# Patient Record
Sex: Female | Born: 1935 | Race: White | Hispanic: No | Smoking: Never smoker
Health system: Southern US, Community
[De-identification: ages and names within clinical notes are randomized; demographics above are authoritative.]

## PROBLEM LIST (undated history)

## (undated) DIAGNOSIS — E7800 Pure hypercholesterolemia, unspecified: Secondary | ICD-10-CM

## (undated) DIAGNOSIS — K5792 Diverticulitis of intestine, part unspecified, without perforation or abscess without bleeding: Secondary | ICD-10-CM

## (undated) DIAGNOSIS — E785 Hyperlipidemia, unspecified: Secondary | ICD-10-CM

## (undated) DIAGNOSIS — E039 Hypothyroidism, unspecified: Secondary | ICD-10-CM

## (undated) DIAGNOSIS — M1990 Unspecified osteoarthritis, unspecified site: Secondary | ICD-10-CM

## (undated) DIAGNOSIS — I10 Essential (primary) hypertension: Secondary | ICD-10-CM

## (undated) DIAGNOSIS — E079 Disorder of thyroid, unspecified: Secondary | ICD-10-CM

## (undated) HISTORY — DX: Unspecified osteoarthritis, unspecified site: M19.90

## (undated) HISTORY — PX: EYE SURGERY: SHX253

## (undated) HISTORY — PX: ANKLE SURGERY: SHX546

## (undated) HISTORY — PX: ABDOMINAL HYSTERECTOMY: SHX81

## (undated) HISTORY — PX: OTHER SURGICAL HISTORY: SHX169

## (undated) HISTORY — PX: BACK SURGERY: SHX140

---

## 2000-05-14 ENCOUNTER — Other Ambulatory Visit: Admission: RE | Admit: 2000-05-14 | Discharge: 2000-05-14 | Payer: Self-pay | Admitting: Internal Medicine

## 2002-01-13 ENCOUNTER — Other Ambulatory Visit: Admission: RE | Admit: 2002-01-13 | Discharge: 2002-01-13 | Payer: Self-pay | Admitting: *Deleted

## 2002-05-26 ENCOUNTER — Encounter: Payer: Self-pay | Admitting: Emergency Medicine

## 2002-05-26 ENCOUNTER — Inpatient Hospital Stay (HOSPITAL_COMMUNITY): Admission: EM | Admit: 2002-05-26 | Discharge: 2002-05-27 | Payer: Self-pay | Admitting: Emergency Medicine

## 2010-08-06 ENCOUNTER — Encounter: Payer: Self-pay | Admitting: Specialist

## 2010-12-27 ENCOUNTER — Ambulatory Visit
Admission: RE | Admit: 2010-12-27 | Discharge: 2010-12-27 | Disposition: A | Discharge status: Home / Self Care | Payer: BC Managed Care – PPO | Source: Ambulatory Visit | Attending: Family Medicine | Admitting: Family Medicine

## 2010-12-27 ENCOUNTER — Other Ambulatory Visit: Payer: Self-pay | Admitting: Family Medicine

## 2010-12-27 DIAGNOSIS — R42 Dizziness and giddiness: Secondary | ICD-10-CM

## 2010-12-27 DIAGNOSIS — G44009 Cluster headache syndrome, unspecified, not intractable: Secondary | ICD-10-CM

## 2010-12-28 ENCOUNTER — Other Ambulatory Visit (HOSPITAL_BASED_OUTPATIENT_CLINIC_OR_DEPARTMENT_OTHER): Payer: Self-pay | Admitting: Family Medicine

## 2010-12-28 DIAGNOSIS — R51 Headache: Secondary | ICD-10-CM

## 2010-12-29 ENCOUNTER — Ambulatory Visit (HOSPITAL_BASED_OUTPATIENT_CLINIC_OR_DEPARTMENT_OTHER)
Admission: RE | Admit: 2010-12-29 | Discharge: 2010-12-29 | Disposition: A | Discharge status: Home / Self Care | Payer: Medicare Other | Source: Ambulatory Visit | Attending: Family Medicine | Admitting: Family Medicine

## 2010-12-29 DIAGNOSIS — G319 Degenerative disease of nervous system, unspecified: Secondary | ICD-10-CM | POA: Insufficient documentation

## 2010-12-29 DIAGNOSIS — R51 Headache: Secondary | ICD-10-CM | POA: Insufficient documentation

## 2010-12-29 DIAGNOSIS — J3489 Other specified disorders of nose and nasal sinuses: Secondary | ICD-10-CM | POA: Insufficient documentation

## 2010-12-29 DIAGNOSIS — I679 Cerebrovascular disease, unspecified: Secondary | ICD-10-CM | POA: Insufficient documentation

## 2010-12-29 DIAGNOSIS — R42 Dizziness and giddiness: Secondary | ICD-10-CM | POA: Insufficient documentation

## 2011-09-21 ENCOUNTER — Emergency Department (HOSPITAL_BASED_OUTPATIENT_CLINIC_OR_DEPARTMENT_OTHER)
Admission: EM | Admit: 2011-09-21 | Discharge: 2011-09-21 | Disposition: A | Discharge status: Home Health | Payer: Medicare Other | Attending: Emergency Medicine | Admitting: Emergency Medicine

## 2011-09-21 ENCOUNTER — Encounter (HOSPITAL_BASED_OUTPATIENT_CLINIC_OR_DEPARTMENT_OTHER): Payer: Self-pay

## 2011-09-21 DIAGNOSIS — I10 Essential (primary) hypertension: Secondary | ICD-10-CM | POA: Insufficient documentation

## 2011-09-21 DIAGNOSIS — R109 Unspecified abdominal pain: Secondary | ICD-10-CM | POA: Insufficient documentation

## 2011-09-21 DIAGNOSIS — R3915 Urgency of urination: Secondary | ICD-10-CM | POA: Insufficient documentation

## 2011-09-21 DIAGNOSIS — E079 Disorder of thyroid, unspecified: Secondary | ICD-10-CM | POA: Insufficient documentation

## 2011-09-21 DIAGNOSIS — N390 Urinary tract infection, site not specified: Secondary | ICD-10-CM | POA: Insufficient documentation

## 2011-09-21 DIAGNOSIS — R300 Dysuria: Secondary | ICD-10-CM | POA: Insufficient documentation

## 2011-09-21 DIAGNOSIS — E785 Hyperlipidemia, unspecified: Secondary | ICD-10-CM | POA: Insufficient documentation

## 2011-09-21 DIAGNOSIS — Z79899 Other long term (current) drug therapy: Secondary | ICD-10-CM | POA: Insufficient documentation

## 2011-09-21 HISTORY — DX: Essential (primary) hypertension: I10

## 2011-09-21 HISTORY — DX: Hyperlipidemia, unspecified: E78.5

## 2011-09-21 HISTORY — DX: Disorder of thyroid, unspecified: E07.9

## 2011-09-21 LAB — URINALYSIS, ROUTINE W REFLEX MICROSCOPIC
Glucose, UA: NEGATIVE mg/dL
Ketones, ur: 15 mg/dL — AB
Protein, ur: NEGATIVE mg/dL
Urobilinogen, UA: 1 mg/dL (ref 0.0–1.0)

## 2011-09-21 LAB — CBC
HCT: 39.1 % (ref 36.0–46.0)
MCHC: 33.8 g/dL (ref 30.0–36.0)
MCV: 89.7 fL (ref 78.0–100.0)
Platelets: 265 10*3/uL (ref 150–400)
RDW: 13.4 % (ref 11.5–15.5)

## 2011-09-21 LAB — COMPREHENSIVE METABOLIC PANEL
AST: 20 U/L (ref 0–37)
Albumin: 3.7 g/dL (ref 3.5–5.2)
Calcium: 9.3 mg/dL (ref 8.4–10.5)
Creatinine, Ser: 0.7 mg/dL (ref 0.50–1.10)
Sodium: 135 mEq/L (ref 135–145)

## 2011-09-21 LAB — DIFFERENTIAL
Basophils Absolute: 0 10*3/uL (ref 0.0–0.1)
Basophils Relative: 0 % (ref 0–1)
Eosinophils Relative: 0 % (ref 0–5)
Monocytes Absolute: 0.9 10*3/uL (ref 0.1–1.0)
Neutro Abs: 15.4 10*3/uL — ABNORMAL HIGH (ref 1.7–7.7)

## 2011-09-21 LAB — URINE MICROSCOPIC-ADD ON

## 2011-09-21 MED ORDER — CIPROFLOXACIN HCL 500 MG PO TABS: 500.0000 mg | ORAL_TABLET | Freq: Two times a day (BID) | ORAL | Status: AC

## 2011-09-21 MED ORDER — DEXTROSE 5 % IV SOLN
1.0000 g | INTRAVENOUS | Status: DC
  Administered 2011-09-21: 1 g via INTRAVENOUS
  Filled 2011-09-21: qty 10

## 2011-09-21 MED ORDER — SODIUM CHLORIDE 0.9 % IV SOLN
Freq: Once | INTRAVENOUS | Status: AC
  Administered 2011-09-21: 15:00:00 via INTRAVENOUS

## 2011-09-21 MED ORDER — CIPROFLOXACIN HCL 500 MG PO TABS
500.0000 mg | ORAL_TABLET | Freq: Once | ORAL | Status: AC
  Administered 2011-09-21: 500 mg via ORAL
  Filled 2011-09-21: qty 1

## 2011-09-21 NOTE — Discharge Instructions (Signed)

## 2011-09-21 NOTE — ED Provider Notes (Signed)
History     CSN: [id number]  Arrival date & time 09/21/11  1311   First MD Initiated Contact with Patient 09/21/11 1346      Chief Complaint  Patient presents with  . Dysuria  . Abdominal Pain    (Consider location/radiation/quality/duration/timing/severity/associated sxs/prior treatment) Patient is a 78 y.o. female presenting with dysuria. The history is provided by the patient. No language interpreter was used.  Dysuria  This is a new problem. The current episode started 3 to 5 hours ago. The problem occurs every urination. The problem has not changed since onset.The quality of the pain is described as burning. The pain is at a severity of 3/10. The pain is mild. There has been no fever. She is not sexually active. Associated symptoms include urgency. Pertinent negatives include no chills, no nausea, no vomiting, no discharge, no hematuria and no flank pain. Treatments tried: cystex. Her past medical history is significant for recurrent UTIs.  Pt complains of burning with urination.  Pt reports she has had urinary tract infections in the past and that this feels the same way.  Pt denies fever, no vomitting  Past Medical History  Diagnosis Date  . Thyroid disease   . Hypertension   . Hyperlipemia     Past Surgical History  Procedure Date  . Back surgery   . Abdominal hysterectomy   . Ankle surgery   . Eye surgery     No family history on file.  History  Substance Use Topics  . Smoking status: Never Smoker   . Smokeless tobacco: Never Used  . Alcohol Use: No    OB History    Grav Para Term Preterm Abortions TAB SAB Ect Mult Living                  Review of Systems  Constitutional: Negative for chills.  Gastrointestinal: Negative for nausea and vomiting.  Genitourinary: Positive for dysuria and urgency. Negative for hematuria and flank pain.  All other systems reviewed and are negative.    Allergies  Sulfa antibiotics and Macrobid  Home Medications    Current Outpatient Rx  Name Route Sig Dispense Refill  . B COMPLEX VITAMINS PO CAPS Oral Take 1 capsule by mouth daily.    . OMEGA-3 FATTY ACIDS 1000 MG PO CAPS Oral Take by mouth daily.    Marland Kitchen LEVOTHYROXINE SODIUM 100 MCG PO TABS Oral Take 100 mcg by mouth daily.    Marland Kitchen LISINOPRIL 10 MG PO TABS Oral Take 10 mg by mouth daily.    Marland Kitchen METOPROLOL SUCCINATE ER 100 MG PO TB24 Oral Take 100 mg by mouth daily. Take with or immediately following a meal.    . SIMVASTATIN 10 MG PO TABS Oral Take 10 mg by mouth at bedtime.      BP 131/52  Pulse 80  Temp(Src) 99.7 F (37.6 C) (Oral)  Resp 16  Ht 5\' 5"  (1.651 m)  Wt 157 lb (71.215 kg)  BMI 26.13 kg/m2  SpO2 94%  Physical Exam  Nursing note and vitals reviewed. Constitutional: She is oriented to person, place, and time. She appears well-developed and well-nourished.  HENT:  Head: Normocephalic and atraumatic.  Eyes: Conjunctivae are normal. Pupils are equal, round, and reactive to light.  Neck: Normal range of motion.  Cardiovascular: Normal rate and normal heart sounds.   Pulmonary/Chest: Effort normal and breath sounds normal.  Abdominal: Soft. There is no tenderness.  Musculoskeletal: Normal range of motion.  Neurological: She is alert and  oriented to person, place, and time. She has normal reflexes.  Skin: Skin is warm.  Psychiatric: She has a normal mood and affect.    ED Course  Procedures (including critical care time)   Labs Reviewed  URINALYSIS, ROUTINE W REFLEX MICROSCOPIC   No results found.   No diagnosis found.    MDM  Pt given Iv Rocephin.  Labs obtained.  Pt has taken cipro in the past without problems.  Pt given cipro po       Lonia Skinner [place], [place] 09/23/11 1721

## 2011-09-21 NOTE — ED Notes (Signed)
Pt reports suprapubic pain that started this am unrelieved after taking cystex

## 2011-09-25 NOTE — ED Provider Notes (Signed)
Patient seen and examined and agree with assessment and plan.   Hilario Quarry, MD 09/25/11 [phone]

## 2011-10-29 ENCOUNTER — Telehealth (INDEPENDENT_AMBULATORY_CARE_PROVIDER_SITE_OTHER): Payer: Self-pay

## 2011-10-29 ENCOUNTER — Emergency Department (INDEPENDENT_AMBULATORY_CARE_PROVIDER_SITE_OTHER): Payer: Medicare Other

## 2011-10-29 ENCOUNTER — Inpatient Hospital Stay (HOSPITAL_BASED_OUTPATIENT_CLINIC_OR_DEPARTMENT_OTHER)
Admission: EM | Admit: 2011-10-29 | Discharge: 2011-11-05 | DRG: 372 | Disposition: A | Discharge status: Home Health | Payer: Medicare Other | Attending: Surgery | Admitting: Surgery

## 2011-10-29 ENCOUNTER — Encounter (HOSPITAL_BASED_OUTPATIENT_CLINIC_OR_DEPARTMENT_OTHER): Payer: Self-pay

## 2011-10-29 DIAGNOSIS — K5732 Diverticulitis of large intestine without perforation or abscess without bleeding: Secondary | ICD-10-CM | POA: Diagnosis present

## 2011-10-29 DIAGNOSIS — R109 Unspecified abdominal pain: Secondary | ICD-10-CM

## 2011-10-29 DIAGNOSIS — M949 Disorder of cartilage, unspecified: Secondary | ICD-10-CM

## 2011-10-29 DIAGNOSIS — R1032 Left lower quadrant pain: Secondary | ICD-10-CM

## 2011-10-29 DIAGNOSIS — E039 Hypothyroidism, unspecified: Secondary | ICD-10-CM | POA: Diagnosis present

## 2011-10-29 DIAGNOSIS — E785 Hyperlipidemia, unspecified: Secondary | ICD-10-CM | POA: Diagnosis present

## 2011-10-29 DIAGNOSIS — K630 Abscess of intestine: Principal | ICD-10-CM | POA: Diagnosis present

## 2011-10-29 DIAGNOSIS — K5730 Diverticulosis of large intestine without perforation or abscess without bleeding: Secondary | ICD-10-CM

## 2011-10-29 DIAGNOSIS — I10 Essential (primary) hypertension: Secondary | ICD-10-CM | POA: Diagnosis present

## 2011-10-29 HISTORY — DX: Diverticulitis of intestine, part unspecified, without perforation or abscess without bleeding: K57.92

## 2011-10-29 LAB — DIFFERENTIAL
Eosinophils Relative: 1 % (ref 0–5)
Lymphocytes Relative: 9 % — ABNORMAL LOW (ref 12–46)
Monocytes Absolute: 2.1 10*3/uL — ABNORMAL HIGH (ref 0.1–1.0)
Monocytes Relative: 8 % (ref 3–12)
Neutrophils Relative %: 82 % — ABNORMAL HIGH (ref 43–77)

## 2011-10-29 LAB — COMPREHENSIVE METABOLIC PANEL
ALT: 26 U/L (ref 0–35)
AST: 29 U/L (ref 0–37)
CO2: 28 mEq/L (ref 19–32)
Calcium: 9.3 mg/dL (ref 8.4–10.5)
GFR calc non Af Amer: 64 mL/min — ABNORMAL LOW (ref >90)
Sodium: 134 mEq/L — ABNORMAL LOW (ref 135–145)

## 2011-10-29 LAB — CBC
Hemoglobin: 11.6 g/dL — ABNORMAL LOW (ref 12.0–15.0)
MCH: 29.5 pg (ref 26.0–34.0)
MCHC: 33.5 g/dL (ref 30.0–36.0)
MCV: 88 fL (ref 78.0–100.0)
Platelets: 640 10*3/uL — ABNORMAL HIGH (ref 150–400)
Platelets: 641 10*3/uL — ABNORMAL HIGH (ref 150–400)
RBC: 4.17 MIL/uL (ref 3.87–5.11)
WBC: 26.4 10*3/uL — ABNORMAL HIGH (ref 4.0–10.5)

## 2011-10-29 LAB — URINALYSIS, ROUTINE W REFLEX MICROSCOPIC
Bilirubin Urine: NEGATIVE
Hgb urine dipstick: NEGATIVE
Specific Gravity, Urine: 1.01 (ref 1.005–1.030)
Urobilinogen, UA: 0.2 mg/dL (ref 0.0–1.0)
pH: 6 (ref 5.0–8.0)

## 2011-10-29 LAB — CREATININE, SERUM: Creatinine, Ser: 0.67 mg/dL (ref 0.50–1.10)

## 2011-10-29 MED ORDER — PANTOPRAZOLE SODIUM 40 MG IV SOLR
40.0000 mg | Freq: Every day | INTRAVENOUS | Status: DC
  Administered 2011-10-29 – 2011-11-03 (×6): 40 mg via INTRAVENOUS
  Filled 2011-10-29 (×7): qty 40

## 2011-10-29 MED ORDER — ACETAMINOPHEN 325 MG PO TABS
650.0000 mg | ORAL_TABLET | Freq: Four times a day (QID) | ORAL | Status: DC | PRN
  Administered 2011-11-01: 325 mg via ORAL
  Administered 2011-11-02 – 2011-11-05 (×10): 650 mg via ORAL
  Filled 2011-10-29 (×10): qty 2
  Filled 2011-10-29: qty 1

## 2011-10-29 MED ORDER — METRONIDAZOLE IN NACL 5-0.79 MG/ML-% IV SOLN
500.0000 mg | Freq: Once | INTRAVENOUS | Status: AC
  Administered 2011-10-29: 500 mg via INTRAVENOUS
  Filled 2011-10-29: qty 100

## 2011-10-29 MED ORDER — KCL IN DEXTROSE-NACL 20-5-0.45 MEQ/L-%-% IV SOLN
INTRAVENOUS | Status: DC
  Administered 2011-10-29 – 2011-11-03 (×6): via INTRAVENOUS
  Filled 2011-10-29 (×13): qty 1000

## 2011-10-29 MED ORDER — HYDROCODONE-ACETAMINOPHEN 5-325 MG PO TABS
1.0000 | ORAL_TABLET | ORAL | Status: DC | PRN
  Administered 2011-11-01: 2 via ORAL
  Administered 2011-11-01: 1 via ORAL
  Filled 2011-10-29: qty 2
  Filled 2011-10-29 (×2): qty 1

## 2011-10-29 MED ORDER — PIPERACILLIN-TAZOBACTAM 3.375 G IVPB
3.3750 g | Freq: Once | INTRAVENOUS | Status: AC
  Administered 2011-10-29: 3.375 g via INTRAVENOUS
  Filled 2011-10-29: qty 50

## 2011-10-29 MED ORDER — IOHEXOL 300 MG/ML  SOLN
100.0000 mL | Freq: Once | INTRAMUSCULAR | Status: AC | PRN
  Administered 2011-10-29: 100 mL via INTRAVENOUS

## 2011-10-29 MED ORDER — ACETAMINOPHEN 650 MG RE SUPP: 650.0000 mg | Freq: Four times a day (QID) | RECTAL | Status: DC | PRN

## 2011-10-29 MED ORDER — DIPHENHYDRAMINE HCL 50 MG/ML IJ SOLN: 12.5000 mg | Freq: Four times a day (QID) | INTRAMUSCULAR | Status: DC | PRN

## 2011-10-29 MED ORDER — ONDANSETRON HCL 4 MG/2ML IJ SOLN: 4.0000 mg | Freq: Four times a day (QID) | INTRAMUSCULAR | Status: DC | PRN

## 2011-10-29 MED ORDER — PIPERACILLIN-TAZOBACTAM 3.375 G IVPB
3.3750 g | Freq: Three times a day (TID) | INTRAVENOUS | Status: DC
Start: 2011-10-29 — End: 2011-10-29
  Filled 2011-10-29 (×2): qty 50

## 2011-10-29 MED ORDER — METOPROLOL TARTRATE 25 MG PO TABS
25.0000 mg | ORAL_TABLET | Freq: Every day | ORAL | Status: DC
  Administered 2011-10-29 – 2011-11-05 (×8): 25 mg via ORAL
  Filled 2011-10-29 (×8): qty 1

## 2011-10-29 MED ORDER — LEVOTHYROXINE SODIUM 88 MCG PO TABS
88.0000 ug | ORAL_TABLET | Freq: Every day | ORAL | Status: DC
  Administered 2011-10-30 – 2011-11-05 (×7): 88 ug via ORAL
  Filled 2011-10-29 (×8): qty 1

## 2011-10-29 MED ORDER — HEPARIN SODIUM (PORCINE) 5000 UNIT/ML IJ SOLN
5000.0000 [IU] | Freq: Three times a day (TID) | INTRAMUSCULAR | Status: DC
  Administered 2011-10-30 – 2011-11-05 (×18): 5000 [IU] via SUBCUTANEOUS
  Filled 2011-10-29 (×20): qty 1

## 2011-10-29 MED ORDER — DIPHENHYDRAMINE HCL 12.5 MG/5ML PO ELIX: 12.5000 mg | ORAL_SOLUTION | Freq: Four times a day (QID) | ORAL | Status: DC | PRN

## 2011-10-29 MED ORDER — SODIUM CHLORIDE 0.9 % IV BOLUS (SEPSIS)
500.0000 mL | Freq: Once | INTRAVENOUS | Status: AC
  Administered 2011-10-29: 11:00:00 via INTRAVENOUS

## 2011-10-29 MED ORDER — OMEGA-3 FATTY ACIDS 1000 MG PO CAPS: 1.0000 g | ORAL_CAPSULE | Freq: Two times a day (BID) | ORAL | Status: DC

## 2011-10-29 MED ORDER — HEPARIN SODIUM (PORCINE) 5000 UNIT/ML IJ SOLN
5000.0000 [IU] | Freq: Three times a day (TID) | INTRAMUSCULAR | Status: AC
  Administered 2011-10-29 – 2011-10-30 (×2): 5000 [IU] via SUBCUTANEOUS
  Filled 2011-10-29 (×3): qty 1

## 2011-10-29 MED ORDER — IOHEXOL 300 MG/ML  SOLN
36.0000 mL | Freq: Once | INTRAMUSCULAR | Status: AC | PRN
  Administered 2011-10-29: 36 mL via INTRAVENOUS

## 2011-10-29 MED ORDER — PIPERACILLIN-TAZOBACTAM 3.375 G IVPB
3.3750 g | Freq: Three times a day (TID) | INTRAVENOUS | Status: DC
  Administered 2011-10-29 – 2011-11-04 (×17): 3.375 g via INTRAVENOUS
  Filled 2011-10-29 (×20): qty 50

## 2011-10-29 MED ORDER — MORPHINE SULFATE 2 MG/ML IJ SOLN
1.0000 mg | INTRAMUSCULAR | Status: DC | PRN
  Administered 2011-10-30 – 2011-10-31 (×4): 2 mg via INTRAVENOUS
  Filled 2011-10-29 (×4): qty 1

## 2011-10-29 MED ORDER — OMEGA-3-ACID ETHYL ESTERS 1 G PO CAPS
1.0000 g | ORAL_CAPSULE | Freq: Two times a day (BID) | ORAL | Status: DC
  Administered 2011-10-29 – 2011-11-05 (×14): 1 g via ORAL
  Filled 2011-10-29 (×15): qty 1

## 2011-10-29 NOTE — H&P (Signed)
Mikayla Villanueva is an 78 y.o. female.   Mikayla Villanueva, Mikayla Villanueva, [place] Chief Complaint: Abdominal pain HPI: The patient is a 78 year old female who first developed pain and was seen on 09/21/2010. She was thought to have a urinary tract infection and placed on Cipro by mouth. She finish the course and continued to have abdominal pain and presented to her primary care doctor Mikayla Villanueva, restart her second course of Cipro. She got somewhat better, but never had resolution of her lower abdominal and left lower quadrant discomfort. She completed the last course of Cipro on 10/26/2011. She continues to have pain especially with ambulation. She describes as deep and her abdomen. She presented to the emergency room and [facility] today. White count is 26,400. CT scan shows a 5.5 x 9.5 cm multiloculated fluid collection in the left lower quadrant, thickening of the sigmoid colon more multiple sigmoid colon diverticula. No hydronephrosis or hydroureter some osteopenia with degenerative changes of the thoracic spine. This is consistent with sigmoid diverticulitis and abscess. Dr.Gerkin was contact and patient was transferred to [facility] for admission and further medical treatment.  Past Medical History  Diagnosis Date  . Thyroid disease   . Hypertension   . Hyperlipemia   . Diverticulitis 20 plus years ago. 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 Hx.of Hand tremors,   Bilateral Neuropathy both lower legs/feet    Prior Work up shows she did not have Parkinson Dz.    Past Surgical History  Procedure Date  . Back surgery   . Abdominal hysterectomy   . Ankle surgery   . Eye surgery     No family history on file. Social History:  reports that she has never smoked. She has never used smokeless tobacco. She reports that she does not drink alcohol or use illicit drugs.  Allergies:  Allergies  Allergen Reactions  . Sulfa  Antibiotics Hives  . Macrobid Hives    Medications Prior to Admission  Medication Dose Route Frequency Provider Last Rate Last Dose  . iohexol (OMNIPAQUE) 300 MG/ML solution 100 mL  100 mL Intravenous Once PRN Loren Racer, MD   100 mL at 10/29/11 1158  . iohexol (OMNIPAQUE) 300 MG/ML solution 36 mL  36 mL Intravenous Once PRN Loren Racer, MD   36 mL at 10/29/11 1058  . metroNIDAZOLE (FLAGYL) IVPB 500 mg  500 mg Intravenous Once Loren Racer, MD   500 mg at 10/29/11 1336  . piperacillin-tazobactam (ZOSYN) IVPB 3.375 g  3.375 g Intravenous Once Loren Racer, MD   3.375 g at 10/29/11 1243  . sodium chloride 0.9 % bolus 500 mL  500 mL Intravenous Once Loren Racer, MD       Medications Prior to Admission  Medication Sig Dispense Refill  . fish oil-omega-3 fatty acids 1000 MG capsule Take 1 g by mouth 2 (two) times daily.       Marland Kitchen levothyroxine (SYNTHROID, LEVOTHROID) 88 MCG tablet Take 88 mcg by mouth daily.      Marland Kitchen lisinopril (PRINIVIL,ZESTRIL) 20 MG tablet Take 20 mg by mouth daily.      . meloxicam (MOBIC) 7.5 MG tablet Take 7.5 mg by mouth daily.      . simvastatin (ZOCOR) 40 MG tablet Take 40 mg by mouth every evening.      Marland Kitchen DISCONTD: metoprolol tartrate (LOPRESSOR) 25 MG tablet Take 25 mg by mouth 2 (two) times daily.  Results for orders placed during the hospital encounter of 10/29/11 (from the past 48 hour(s))  CBC     Status: Abnormal   Collection Time   10/29/11 10:50 AM      Component Value Range Comment   WBC 26.4 (*) 4.0 - 10.5 (K/uL)    RBC 4.17  3.87 - 5.11 (MIL/uL)    Hemoglobin 12.5  12.0 - 15.0 (g/dL)    HCT 16.1  09.6 - 04.5 (%)    MCV 87.5  78.0 - 100.0 (fL)    MCH 30.0  26.0 - 34.0 (pg)    MCHC 34.2  30.0 - 36.0 (g/dL)    RDW 40.9  81.1 - 91.4 (%)    Platelets 640 (*) 150 - 400 (K/uL)   DIFFERENTIAL     Status: Abnormal   Collection Time   10/29/11 10:50 AM      Component Value Range Comment   Neutrophils Relative 82 (*) 43 - 77 (%)     Lymphocytes Relative 9 (*) 12 - 46 (%)    Monocytes Relative 8  3 - 12 (%)    Eosinophils Relative 1  0 - 5 (%)    Basophils Relative 0  0 - 1 (%)    Neutro Abs 21.6 (*) 1.7 - 7.7 (K/uL)    Lymphs Abs 2.4  0.7 - 4.0 (K/uL)    Monocytes Absolute 2.1 (*) 0.1 - 1.0 (K/uL)    Eosinophils Absolute 0.3  0.0 - 0.7 (K/uL)    Basophils Absolute 0.0  0.0 - 0.1 (K/uL)    WBC Morphology WHITE COUNT CONFIRMED ON SMEAR     COMPREHENSIVE METABOLIC PANEL     Status: Abnormal   Collection Time   10/29/11 10:50 AM      Component Value Range Comment   Sodium 134 (*) 135 - 145 (mEq/L)    Potassium 4.0  3.5 - 5.1 (mEq/L)    Chloride 95 (*) 96 - 112 (mEq/L)    CO2 28  19 - 32 (mEq/L)    Glucose, Bld 137 (*) 70 - 99 (mg/dL)    BUN 20  6 - 23 (mg/dL)    Creatinine, Ser 7.82  0.50 - 1.10 (mg/dL)    Calcium 9.3  8.4 - 10.5 (mg/dL)    Total Protein 8.1  6.0 - 8.3 (g/dL)    Albumin 3.1 (*) 3.5 - 5.2 (g/dL)    AST 29  0 - 37 (U/L)    ALT 26  0 - 35 (U/L)    Alkaline Phosphatase 161 (*) 39 - 117 (U/L)    Total Bilirubin 0.5  0.3 - 1.2 (mg/dL)    GFR calc non Af Amer 64 (*) >90 (mL/min)    GFR calc Af Amer 74 (*) >90 (mL/min)   URINALYSIS, ROUTINE W REFLEX MICROSCOPIC     Status: Normal   Collection Time   10/29/11 10:59 AM      Component Value Range Comment   Color, Urine YELLOW  YELLOW     APPearance CLEAR  CLEAR     Specific Gravity, Urine 1.010  1.005 - 1.030     pH 6.0  5.0 - 8.0     Glucose, UA NEGATIVE  NEGATIVE (mg/dL)    Hgb urine dipstick NEGATIVE  NEGATIVE     Bilirubin Urine NEGATIVE  NEGATIVE     Ketones, ur NEGATIVE  NEGATIVE (mg/dL)    Protein, ur NEGATIVE  NEGATIVE (mg/dL)    Urobilinogen, UA 0.2  0.0 - 1.0 (  mg/dL)    Nitrite NEGATIVE  NEGATIVE     Leukocytes, UA NEGATIVE  NEGATIVE  MICROSCOPIC NOT DONE ON URINES WITH NEGATIVE PROTEIN, BLOOD, LEUKOCYTES, NITRITE, OR GLUCOSE <1000 mg/dL.   Ct Abdomen Pelvis W Contrast  10/29/2011  *RADIOLOGY REPORT*  Clinical Data:  left lower quadrant  pain  CT ABDOMEN AND PELVIS WITH CONTRAST  Technique:  Multidetector CT imaging of the abdomen and pelvis was performed following the standard protocol during bolus administration of intravenous contrast.  Contrast: OMNIPAQUE IOHEXOL 300 MG/ML  SOLN  Comparison: 02/20/2006  Findings: Lung bases are unremarkable.  Sagittal images of the spine shows diffuse osteopenia.  Multilevel degenerative changes lumbar spine are noted.  Enhanced liver is unremarkable.  No intrahepatic biliary ductal dilatation.  Atherosclerotic calcifications of the abdominal aorta and the iliac arteries.  No evidence of aortic aneurysm.  The pancreas, spleen and adrenal glands are unremarkable.  Enhanced kidneys are symmetrical in size.  No hydronephrosis or hydroureter.  Delayed renal images shows bilateral renal symmetrical excretion.  A few diverticula are noted descending colon.  Multiple sigmoid colon diverticula are noted.  There is a oval-shaped multiloculated fluid collection in close proximity with the sigmoid colon in the left pelvis extending below the sigmoid colon.  This collection measures about 9.5 cm length by 5.5 cm thickness.  This probable represents a pelvic abscess or diverticular abscess.  Less likely a multiloculated cystic ovarian neoplasm.  Clinical correlation is necessary.  Some stool noted in the sigmoid colon.  The patient is status post hysterectomy.  IMPRESSION:  1.  Multiple sigmoid colon diverticula are noted.  There is some thickening of the sigmoid colon wall.  There is a collection with enhancing wall adjacent to sigmoid colon extending inferiorly to sigmoid colon measures at least 5.5 x 9.5 cm.  This is highly suspicious for pelvic diverticular abscess.  Clinical correlation is necessary.  Less likely multicystic ovarian neoplasm. Follow-up examination after treatment is recommended to assure resolution.  2.  No hydronephrosis or hydroureter. 3.  Osteopenia and degenerative changes thoracic spine. 4.   No pericecal inflammation.  No small bowel obstruction.  Original Report Authenticated By: Natasha Mead, M.D.    Review of Systems  Constitutional: Negative for fever, chills, weight loss and diaphoresis.  HENT: Negative.   Eyes: Negative.   Respiratory: Negative.   Cardiovascular: Negative.   Gastrointestinal: Positive for abdominal pain (lower abdomen and LLQ). Negative for heartburn, nausea, vomiting, diarrhea, constipation, blood in stool and melena.  Genitourinary: Positive for frequency (Today after IV and  oral contrast). Negative for dysuria, urgency, hematuria and flank pain.       She was told she had UTI with 2 rounds of Cipro.  Musculoskeletal: Positive for back pain. Negative for myalgias.       Some back pain, chronic on and off.   Some neuropathy both lower legs. Hand tremors for some years. W/U ruled out Parkinson.   Neurological: Negative for weakness.  Endo/Heme/Allergies: Negative.     Blood pressure 145/83, pulse 80, temperature 97.5 F (36.4 C), temperature source Oral, resp. rate 18, height 5\' 4"  (1.626 m), weight 71.215 kg (157 lb), SpO2 96.00%. Physical Exam  Constitutional: She is oriented to person, place, and time. She appears well-developed and well-nourished. No distress.  HENT:  Head: Normocephalic and atraumatic.  Nose: Nose normal.  Mouth/Throat: No oropharyngeal exudate.  Eyes: Conjunctivae and EOM are normal. Pupils are equal, round, and reactive to light. Right eye exhibits no discharge. Left  eye exhibits no discharge. No scleral icterus.  Neck: Normal range of motion. Neck supple. No JVD present. No tracheal deviation present. No thyromegaly present.  Cardiovascular: Normal rate, regular rhythm and intact distal pulses.  Exam reveals no gallop.   Murmur heard. Respiratory: Effort normal and breath sounds normal. No respiratory distress. She has no wheezes. She has no rales. She exhibits no tenderness.  GI: Soft. Bowel sounds are normal. She exhibits  no distension and no mass. There is tenderness (LLQ, more discomfort feeling femoral pulse than LLQ on palpation). There is rebound. There is no guarding.  Musculoskeletal: She exhibits tenderness. She exhibits no edema.  Lymphadenopathy:    She has no cervical adenopathy.  Neurological: She is alert and oriented to person, place, and time. She has normal reflexes. She displays normal reflexes. No cranial nerve deficit. Coordination normal.  Skin: Skin is warm and dry. No rash noted. She is not diaphoretic. No erythema.  Psychiatric: She has a normal mood and affect. Her behavior is normal. Judgment and thought content normal.     Assessment/Plan 1. Diverticulitis sigmoid colon with multiloculated fluid collection/abscess.  Last colonoscopy 10 years or more. 2. History of hypertension 3. History of hypothyroid on supplement 4. Dyslipidemia- treated 5. history of hand tremors, and lower extremity neuropathy.  Plan: Verlon Au going to continue IV antibiotics with Zosyn, bowel rest, INR will evaluate and perform percutaneous drainage tomorrow. Continue her home meds with sips of water and ice chips only. With further treatment as indicated. Will [facility] physician assistant for Dr. Darnell Level.   Tejah Brekke 10/29/2011, 3:32 PM

## 2011-10-29 NOTE — ED Notes (Signed)
Pt reports abdominal pain and a 5 week hx of UTI.  Seen here 5 weeks ago, received IV ABS, PO Cipro and re-evaluated by PMD.  Still not feeling well.

## 2011-10-29 NOTE — H&P (Signed)
General Surgery - [facility] - Attending  Patient seen and examined.  Discussed findings and plan with patient and daughter at bedside.  Will proceed with percutaneous drainage procedure in AM 4/16 by interventional radiology.  Continue IV Zosyn.  Patient admitted to surgical service.  Cassara Nida M. Matthe Sloane, MD, FACS [facility] Office: [phone]    

## 2011-10-29 NOTE — Telephone Encounter (Signed)
Dr. Gerrit Friends paged re: Mikayla Villanueva [phone]) for patient ETA, pt to arrive 20 - 30 minutes going to [facility] long room 1539.

## 2011-10-29 NOTE — Progress Notes (Signed)
ANTIBIOTIC CONSULT NOTE - INITIAL  Pharmacy Consult for Zosyn Indication: Sigmoid diverticulitis and abscess   Allergies  Allergen Reactions  . Sulfa Antibiotics Hives  . Macrobid Hives   Patient Measurements: Height: 5\' 4"  (162.6 cm) Weight: 157 lb (71.215 kg) IBW/kg (Calculated) : 54.7   Vital Signs: Temp: 97.5 F (36.4 C) (04/15 1507) Temp src: Oral (04/15 1507) BP: 145/83 mmHg (04/15 1507) Pulse Rate: 80  (04/15 1507) Intake/Output from previous day:   Intake/Output from this shift: Total I/O In: 50 [I.V.:50] Out: 200 [Urine:200]  Labs:  Basename 10/29/11 1050  WBC 26.4*  HGB 12.5  PLT 640*  LABCREA --  CREATININE 0.80   Estimated Creatinine Clearance: 47 ml/min (by C-G formula based on Cr of 0.8). No results found for this basename: VANCOTROUGH:2,VANCOPEAK:2,VANCORANDOM:2,GENTTROUGH:2,GENTPEAK:2,GENTRANDOM:2,TOBRATROUGH:2,TOBRAPEAK:2,TOBRARND:2,AMIKACINPEAK:2,AMIKACINTROU:2,AMIKACIN:2, in the last 72 hours   Microbiology: No results found for this or any previous visit (from the past 720 hour(s)).  Medical History: Past Medical History  Diagnosis Date  . Thyroid disease   . Hypertension   . Hyperlipemia   . Diverticulitis    Medications:  Prescriptions prior to admission  Medication Sig Dispense Refill  . acetaminophen (TYLENOL) 500 MG tablet Take 1,000 mg by mouth 2 (two) times daily as needed. For pain      . fish oil-omega-3 fatty acids 1000 MG capsule Take 1 g by mouth 2 (two) times daily.       Marland Kitchen levothyroxine (SYNTHROID, LEVOTHROID) 88 MCG tablet Take 88 mcg by mouth daily.      Marland Kitchen lisinopril (PRINIVIL,ZESTRIL) 20 MG tablet Take 20 mg by mouth daily.      . meloxicam (MOBIC) 7.5 MG tablet Take 7.5 mg by mouth daily.      . metoprolol tartrate (LOPRESSOR) 25 MG tablet Take 25 mg by mouth daily.      . simvastatin (ZOCOR) 40 MG tablet Take 40 mg by mouth every evening.       Anti-infectives     Start     Dose/Rate Route Frequency Ordered Stop   10/29/11 2000  piperacillin-tazobactam (ZOSYN) IVPB 3.375 g       3.375 g 12.5 mL/hr over 240 Minutes Intravenous 3 times per day 10/29/11 1635     10/29/11 1800   piperacillin-tazobactam (ZOSYN) IVPB 3.375 g  Status:  Discontinued        3.375 g 12.5 mL/hr over 240 Minutes Intravenous 3 times per day 10/29/11 1620 10/29/11 1635   10/29/11 1230  piperacillin-tazobactam (ZOSYN) IVPB 3.375 g       3.375 g 12.5 mL/hr over 240 Minutes Intravenous  Once 10/29/11 1224 10/29/11 1643   10/29/11 1230   metroNIDAZOLE (FLAGYL) IVPB 500 mg        500 mg 100 mL/hr over 60 Minutes Intravenous  Once 10/29/11 1224 10/29/11 1436         Assessment:  78yo F admitted with abdominal pain. Recently treated with Cipro for UTI.  CT revealed diverticulitis and abscess.  Starting empiric Zosyn.  SCr normal, CrCl 47, 55N.  Goal of Therapy:  Appropriate regimen for renal fxn  Plan:   Zosyn 3.375g IV Q8H infused over 4hrs.  F/u daily.  Reece Packer 10/29/2011,4:27 PM

## 2011-10-29 NOTE — Progress Notes (Signed)
Patient ID: Mikayla Villanueva, female   DOB: 07-08-1921, 78 y.o.   MRN: [id number] Request received for CT guided drainage of left pelvic/diverticular abscess. History noted, labs checked, imaging studies reviewed by Dr. Fredia Sorrow. Details/risks of procedure d/w pt/ family with their understanding and consent.   Chest- CTA bilat., heart -RRR, abd.- soft, positive BS, tender suprapubic/LLQ regions.                                                                                                                          Past Medical History  Diagnosis Date  . Thyroid disease   . Hypertension   . Hyperlipemia   . Diverticulitis    Past Surgical History  Procedure Date  . Back surgery   . Abdominal hysterectomy   . Ankle surgery   . Eye surgery    Ct Abdomen Pelvis W Contrast  10/29/2011  *RADIOLOGY REPORT*  Clinical Data:  left lower quadrant pain  CT ABDOMEN AND PELVIS WITH CONTRAST  Technique:  Multidetector CT imaging of the abdomen and pelvis was performed following the standard protocol during bolus administration of intravenous contrast.  Contrast: OMNIPAQUE IOHEXOL 300 MG/ML  SOLN  Comparison: 02/20/2006  Findings: Lung bases are unremarkable.  Sagittal images of the spine shows diffuse osteopenia.  Multilevel degenerative changes lumbar spine are noted.  Enhanced liver is unremarkable.  No intrahepatic biliary ductal dilatation.  Atherosclerotic calcifications of the abdominal aorta and the iliac arteries.  No evidence of aortic aneurysm.  The pancreas, spleen and adrenal glands are unremarkable.  Enhanced kidneys are symmetrical in size.  No hydronephrosis or hydroureter.  Delayed renal images shows bilateral renal symmetrical excretion.  A few diverticula are noted descending colon.  Multiple sigmoid colon diverticula are noted.  There is a oval-shaped multiloculated fluid collection in close proximity with the sigmoid colon in the left pelvis extending below the sigmoid colon.  This collection  measures about 9.5 cm length by 5.5 cm thickness.  This probable represents a pelvic abscess or diverticular abscess.  Less likely a multiloculated cystic ovarian neoplasm.  Clinical correlation is necessary.  Some stool noted in the sigmoid colon.  The patient is status post hysterectomy.  IMPRESSION:  1.  Multiple sigmoid colon diverticula are noted.  There is some thickening of the sigmoid colon wall.  There is a collection with enhancing wall adjacent to sigmoid colon extending inferiorly to sigmoid colon measures at least 5.5 x 9.5 cm.  This is highly suspicious for pelvic diverticular abscess.  Clinical correlation is necessary.  Less likely multicystic ovarian neoplasm. Follow-up examination after treatment is recommended to assure resolution.  2.  No hydronephrosis or hydroureter. 3.  Osteopenia and degenerative changes thoracic spine. 4.  No pericecal inflammation.  No small bowel obstruction.  Original Report Authenticated By: Natasha Mead, M.D.  Results for orders placed during the hospital encounter of 10/29/11  CBC      Component Value Range   WBC 26.4 (*) 4.0 -  10.5 (K/uL)   RBC 4.17  3.87 - 5.11 (MIL/uL)   Hemoglobin 12.5  12.0 - 15.0 (g/dL)   HCT 16.1  09.6 - 04.5 (%)   MCV 87.5  78.0 - 100.0 (fL)   MCH 30.0  26.0 - 34.0 (pg)   MCHC 34.2  30.0 - 36.0 (g/dL)   RDW 40.9  81.1 - 91.4 (%)   Platelets 640 (*) 150 - 400 (K/uL)  DIFFERENTIAL      Component Value Range   Neutrophils Relative 82 (*) 43 - 77 (%)   Lymphocytes Relative 9 (*) 12 - 46 (%)   Monocytes Relative 8  3 - 12 (%)   Eosinophils Relative 1  0 - 5 (%)   Basophils Relative 0  0 - 1 (%)   Neutro Abs 21.6 (*) 1.7 - 7.7 (K/uL)   Lymphs Abs 2.4  0.7 - 4.0 (K/uL)   Monocytes Absolute 2.1 (*) 0.1 - 1.0 (K/uL)   Eosinophils Absolute 0.3  0.0 - 0.7 (K/uL)   Basophils Absolute 0.0  0.0 - 0.1 (K/uL)   WBC Morphology WHITE COUNT CONFIRMED ON SMEAR    COMPREHENSIVE METABOLIC PANEL      Component Value Range   Sodium 134 (*) 135  - 145 (mEq/L)   Potassium 4.0  3.5 - 5.1 (mEq/L)   Chloride 95 (*) 96 - 112 (mEq/L)   CO2 28  19 - 32 (mEq/L)   Glucose, Bld 137 (*) 70 - 99 (mg/dL)   BUN 20  6 - 23 (mg/dL)   Creatinine, Ser 7.82  0.50 - 1.10 (mg/dL)   Calcium 9.3  8.4 - 95.6 (mg/dL)   Total Protein 8.1  6.0 - 8.3 (g/dL)   Albumin 3.1 (*) 3.5 - 5.2 (g/dL)   AST 29  0 - 37 (U/L)   ALT 26  0 - 35 (U/L)   Alkaline Phosphatase 161 (*) 39 - 117 (U/L)   Total Bilirubin 0.5  0.3 - 1.2 (mg/dL)   GFR calc non Af Amer 64 (*) >90 (mL/min)   GFR calc Af Amer 74 (*) >90 (mL/min)  URINALYSIS, ROUTINE W REFLEX MICROSCOPIC      Component Value Range   Color, Urine YELLOW  YELLOW    APPearance CLEAR  CLEAR    Specific Gravity, Urine 1.010  1.005 - 1.030    pH 6.0  5.0 - 8.0    Glucose, UA NEGATIVE  NEGATIVE (mg/dL)   Hgb urine dipstick NEGATIVE  NEGATIVE    Bilirubin Urine NEGATIVE  NEGATIVE    Ketones, ur NEGATIVE  NEGATIVE (mg/dL)   Protein, ur NEGATIVE  NEGATIVE (mg/dL)   Urobilinogen, UA 0.2  0.0 - 1.0 (mg/dL)   Nitrite NEGATIVE  NEGATIVE    Leukocytes, UA NEGATIVE  NEGATIVE

## 2011-10-29 NOTE — Progress Notes (Deleted)
General Surgery [facility] - Attending  Patient seen and examined.  Discussed findings and plan with patient and daughter at bedside.  Will proceed with percutaneous drainage procedure in AM 4/16 by interventional radiology.  Continue IV Zosyn.  Patient admitted to surgical service.  Velora Heckler, MD, [facility] Office: [phone]

## 2011-10-29 NOTE — ED Notes (Signed)
CT Tech at bedside for PO contrast administration

## 2011-10-29 NOTE — ED Notes (Signed)
Patient transported to CT via stretcher.

## 2011-10-29 NOTE — ED Provider Notes (Signed)
History     CSN: [id number]  Arrival date & time 10/29/11  1001   First MD Initiated Contact with Patient 10/29/11 1013      Chief Complaint  Patient presents with  . Abdominal Pain    (Consider location/radiation/quality/duration/timing/severity/associated sxs/prior treatment) HPI Pt seen 3/8 and diagnosed with UTI. Given rocephin IV and d/c with course of cipro. Seen by PCP after abx completed still c/o same symptoms and given another course of PO cipro. Pt cont to have lower abd pain, worse in LLQ. She has dysuria and frequency. Denies fever, chills, N/V/D.  Past Medical History  Diagnosis Date  . Thyroid disease   . Hypertension   . Hyperlipemia   . Diverticulitis     Past Surgical History  Procedure Date  . Back surgery   . Abdominal hysterectomy   . Ankle surgery   . Eye surgery     No family history on file.  History  Substance Use Topics  . Smoking status: Never Smoker   . Smokeless tobacco: Never Used  . Alcohol Use: No    OB History    Grav Para Term Preterm Abortions TAB SAB Ect Mult Living                  Review of Systems  Constitutional: Negative for fever and chills.  Gastrointestinal: Positive for abdominal pain. Negative for nausea, vomiting and diarrhea.  Genitourinary: Positive for dysuria and frequency. Negative for flank pain.  Musculoskeletal: Negative for back pain.  Neurological: Negative for weakness and numbness.    Allergies  Sulfa antibiotics and Macrobid  Home Medications   Current Outpatient Rx  Name Route Sig Dispense Refill  . B COMPLEX VITAMINS PO CAPS Oral Take 1 capsule by mouth daily.    . OMEGA-3 FATTY ACIDS 1000 MG PO CAPS Oral Take by mouth daily.    Marland Kitchen LEVOTHYROXINE SODIUM 88 MCG PO TABS Oral Take 88 mcg by mouth daily.    Marland Kitchen LISINOPRIL 20 MG PO TABS Oral Take 20 mg by mouth daily.    . MELOXICAM 7.5 MG PO TABS Oral Take 7.5 mg by mouth daily.    Marland Kitchen METOPROLOL TARTRATE 25 MG PO TABS Oral Take 25 mg by mouth 2  (two) times daily.    Marland Kitchen SIMVASTATIN 40 MG PO TABS Oral Take 40 mg by mouth every evening.      BP 140/73  Pulse 80  Temp(Src) 98.6 F (37 C) (Oral)  Resp 16  Ht 5\' 4"  (1.626 m)  Wt 157 lb (71.215 kg)  BMI 26.95 kg/m2  SpO2 98%  Physical Exam  Nursing note and vitals reviewed. Constitutional: She is oriented to person, place, and time. She appears well-developed and well-nourished. No distress.  HENT:  Head: Normocephalic and atraumatic.  Mouth/Throat: Oropharynx is clear and moist.  Eyes: EOM are normal. Pupils are equal, round, and reactive to light.  Neck: Normal range of motion. Neck supple.  Cardiovascular: Normal rate and regular rhythm.   Pulmonary/Chest: Effort normal and breath sounds normal. No respiratory distress. She has no wheezes. She has no rales.  Abdominal: Soft. Bowel sounds are normal. She exhibits no mass. There is tenderness (suprapubic and LLQ ttp. No rebound or guarding). There is no rebound and no guarding.  Musculoskeletal: Normal range of motion. She exhibits no edema and no tenderness.  Neurological: She is alert and oriented to person, place, and time.       5/5 motor, sensation intact. Ambulatory  Skin: Skin  is warm and dry. No rash noted. No erythema.  Psychiatric: She has a normal mood and affect. Her behavior is normal.    ED Course  Procedures (including critical care time)  Labs Reviewed  CBC - Abnormal; Notable for the following:    WBC 26.4 (*)    Platelets 640 (*)    All other components within normal limits  DIFFERENTIAL - Abnormal; Notable for the following:    Neutrophils Relative 82 (*)    Lymphocytes Relative 9 (*)    Neutro Abs 21.6 (*)    Monocytes Absolute 2.1 (*)    All other components within normal limits  COMPREHENSIVE METABOLIC PANEL - Abnormal; Notable for the following:    Sodium 134 (*)    Chloride 95 (*)    Glucose, Bld 137 (*)    Albumin 3.1 (*)    Alkaline Phosphatase 161 (*)    GFR calc non Af Amer 64 (*)     GFR calc Af Amer 74 (*)    All other components within normal limits  URINALYSIS, ROUTINE W REFLEX MICROSCOPIC  URINE CULTURE   Ct Abdomen Pelvis W Contrast  10/29/2011  *RADIOLOGY REPORT*  Clinical Data:  left lower quadrant pain  CT ABDOMEN AND PELVIS WITH CONTRAST  Technique:  Multidetector CT imaging of the abdomen and pelvis was performed following the standard protocol during bolus administration of intravenous contrast.  Contrast: OMNIPAQUE IOHEXOL 300 MG/ML  SOLN  Comparison: 02/20/2006  Findings: Lung bases are unremarkable.  Sagittal images of the spine shows diffuse osteopenia.  Multilevel degenerative changes lumbar spine are noted.  Enhanced liver is unremarkable.  No intrahepatic biliary ductal dilatation.  Atherosclerotic calcifications of the abdominal aorta and the iliac arteries.  No evidence of aortic aneurysm.  The pancreas, spleen and adrenal glands are unremarkable.  Enhanced kidneys are symmetrical in size.  No hydronephrosis or hydroureter.  Delayed renal images shows bilateral renal symmetrical excretion.  A few diverticula are noted descending colon.  Multiple sigmoid colon diverticula are noted.  There is a oval-shaped multiloculated fluid collection in close proximity with the sigmoid colon in the left pelvis extending below the sigmoid colon.  This collection measures about 9.5 cm length by 5.5 cm thickness.  This probable represents a pelvic abscess or diverticular abscess.  Less likely a multiloculated cystic ovarian neoplasm.  Clinical correlation is necessary.  Some stool noted in the sigmoid colon.  The patient is status post hysterectomy.  IMPRESSION:  1.  Multiple sigmoid colon diverticula are noted.  There is some thickening of the sigmoid colon wall.  There is a collection with enhancing wall adjacent to sigmoid colon extending inferiorly to sigmoid colon measures at least 5.5 x 9.5 cm.  This is highly suspicious for pelvic diverticular abscess.  Clinical correlation  is necessary.  Less likely multicystic ovarian neoplasm. Follow-up examination after treatment is recommended to assure resolution.  2.  No hydronephrosis or hydroureter. 3.  Osteopenia and degenerative changes thoracic spine. 4.  No pericecal inflammation.  No small bowel obstruction.  Original Report Authenticated By: Natasha Mead, M.D.     1. Intestinal diverticular abscess       MDM  Discussed with Dr Sid Falcon. Will accept in transfer to [facility].         Loren Racer, MD 10/29/11 1245

## 2011-10-29 NOTE — ED Notes (Signed)
[facility] in ED for transport.  Family and pt informed of plan of care.

## 2011-10-29 NOTE — ED Notes (Signed)
EMS students at bedside.

## 2011-10-29 NOTE — ED Notes (Signed)
Pt assisted to BSC to void.

## 2011-10-30 ENCOUNTER — Encounter (HOSPITAL_COMMUNITY): Payer: Self-pay | Admitting: Radiology

## 2011-10-30 ENCOUNTER — Inpatient Hospital Stay (HOSPITAL_COMMUNITY): Payer: Medicare Other

## 2011-10-30 LAB — CBC
MCH: 29.8 pg (ref 26.0–34.0)
MCHC: 32.9 g/dL (ref 30.0–36.0)
Platelets: 622 10*3/uL — ABNORMAL HIGH (ref 150–400)
RDW: 14.1 % (ref 11.5–15.5)

## 2011-10-30 LAB — COMPREHENSIVE METABOLIC PANEL
ALT: 18 U/L (ref 0–35)
Albumin: 2.4 g/dL — ABNORMAL LOW (ref 3.5–5.2)
Alkaline Phosphatase: 131 U/L — ABNORMAL HIGH (ref 39–117)
Calcium: 8.3 mg/dL — ABNORMAL LOW (ref 8.4–10.5)
GFR calc Af Amer: 87 mL/min — ABNORMAL LOW (ref >90)
Glucose, Bld: 146 mg/dL — ABNORMAL HIGH (ref 70–99)
Potassium: 3.6 mEq/L (ref 3.5–5.1)
Sodium: 132 mEq/L — ABNORMAL LOW (ref 135–145)
Total Protein: 6.5 g/dL (ref 6.0–8.3)

## 2011-10-30 LAB — URINE CULTURE
Colony Count: 30000
Culture  Setup Time: 201304151656

## 2011-10-30 LAB — PROTIME-INR
INR: 1.2 (ref 0.00–1.49)
Prothrombin Time: 15.5 seconds — ABNORMAL HIGH (ref 11.6–15.2)

## 2011-10-30 MED ORDER — MIDAZOLAM HCL 5 MG/5ML IJ SOLN
INTRAMUSCULAR | Status: AC | PRN
  Administered 2011-10-30: 1 mg via INTRAVENOUS

## 2011-10-30 MED ORDER — FENTANYL CITRATE 0.05 MG/ML IJ SOLN
INTRAMUSCULAR | Status: AC | PRN
  Administered 2011-10-30 (×2): 50 ug via INTRAVENOUS

## 2011-10-30 NOTE — Progress Notes (Signed)
Subjective: Sore left side,  Otherwise about the same.2  Objective: Vital signs in last 24 hours: Temp:  [97.4 F (36.3 C)-99.4 F (37.4 C)] 97.5 F (36.4 C) (04/16 1103) Pulse Rate:  [80-102] 87  (04/16 1103) Resp:  [16-30] 19  (04/16 1103) BP: (102-155)/(60-83) 125/67 mmHg (04/16 1103) SpO2:  [94 %-100 %] 95 % (04/16 1103) Weight:  [71.215 kg (157 lb)] 71.215 kg (157 lb) (04/15 2125) Last BM Date: 10/29/11 Tm 99, VSS,WBC still up, drain placed this AM 120 ml of purulent fluid drained. Intake/Output from previous day: 04/15 0701 - 04/16 0700 In: 50 [I.V.:50] Out: 700 [Urine:700] Intake/Output this shift: Total I/O In: -  Out: 20 [Drains:20]  General appearance: alert, cooperative and no distress GI: tender, +Bs, no distension, drain in LLQ, with some bloody purulent drainage in it  Lab Results:   [facility] 10/30/11 0413 10/29/11 1815  WBC 20.3* 22.1*  HGB 11.2* 11.6*  HCT 34.0* 34.6*  PLT 622* 641*    BMET  Basename 10/30/11 0413 10/29/11 1815 10/29/11 1050  NA 132* -- 134*  K 3.6 -- 4.0  CL 96 -- 95*  CO2 25 -- 28  GLUCOSE 146* -- 137*  BUN 13 -- 20  CREATININE 0.70 0.67 --  CALCIUM 8.3* -- 9.3   PT/INR  Basename 10/30/11 0413  LABPROT 15.5*  INR 1.20     Lab 10/30/11 0413 10/29/11 1050  AST 20 29  ALT 18 26  ALKPHOS 131* 161*  BILITOT 0.5 0.5  PROT 6.5 8.1  ALBUMIN 2.4* 3.1*     Lipase  No results found for this basename: lipase     Studies/Results: Ct Guided Abscess Drain  10/30/2011  *RADIOLOGY REPORT*  Clinical Data: Pelvic diverticular abscess  CT GUIDED LEFT PELVIC DIVERTICULAR ABSCESS 12-FRENCH DRAIN PLACEMENT  Date:  10/30/2011 00:30:00  Radiologist:  Judie Petit. Ruel Favors, M.D.  Medications:  1 mg Versed, 100 mcg Fentanyl  Guidance:  CT  Fluoroscopy time:  None.  Sedation time:  20 minutes  Contrast volume:  None.  Complications:  No immediate  PROCEDURE/FINDINGS:  Informed consent was obtained from the patient following explanation of  the procedure, risks, benefits and alternatives. The patient understands, agrees and consents for the procedure. All questions were addressed.  A time out was performed.  Maximal barrier sterile technique utilized including caps, mask, sterile gowns, sterile gloves, large sterile drape, hand hygiene, and betadine  Previous imaging reviewed.  The patient was positioned supine. Noncontrast localization CT performed through the pelvis.  Complex left pelvic abscess localized.  Under sterile conditions and local anesthesia, an 18 gauge access needle was advanced from an anterior approach into the collection.  Needle position confirmed with CT. Syringe aspiration yielded purulent fluid.  Sample sent for Gram stain and culture.  Guide wire advanced followed by tract dilatation to insert a 12-French drain.  Drain catheter position confirmed with CT.  Syringe aspiration yielded 120 ml purulent fluid.  Catheter secured with a Prolene suture and connected to external suction bulb.  Sterile dressing applied.  No immediate complication.  The patient tolerated the procedure well.  IMPRESSION: Successful CT guided left lower quadrant pelvic abscess drain placement.  Original Report Authenticated By: Judie Petit. Ruel Favors, M.D.   Ct Abdomen Pelvis W Contrast  10/29/2011  *RADIOLOGY REPORT*  Clinical Data:  left lower quadrant pain  CT ABDOMEN AND PELVIS WITH CONTRAST  Technique:  Multidetector CT imaging of the abdomen and pelvis was performed following the standard protocol during bolus  administration of intravenous contrast.  Contrast: OMNIPAQUE IOHEXOL 300 MG/ML  SOLN  Comparison: 02/20/2006  Findings: Lung bases are unremarkable.  Sagittal images of the spine shows diffuse osteopenia.  Multilevel degenerative changes lumbar spine are noted.  Enhanced liver is unremarkable.  No intrahepatic biliary ductal dilatation.  Atherosclerotic calcifications of the abdominal aorta and the iliac arteries.  No evidence of aortic  aneurysm.  The pancreas, spleen and adrenal glands are unremarkable.  Enhanced kidneys are symmetrical in size.  No hydronephrosis or hydroureter.  Delayed renal images shows bilateral renal symmetrical excretion.  A few diverticula are noted descending colon.  Multiple sigmoid colon diverticula are noted.  There is a oval-shaped multiloculated fluid collection in close proximity with the sigmoid colon in the left pelvis extending below the sigmoid colon.  This collection measures about 9.5 cm length by 5.5 cm thickness.  This probable represents a pelvic abscess or diverticular abscess.  Less likely a multiloculated cystic ovarian neoplasm.  Clinical correlation is necessary.  Some stool noted in the sigmoid colon.  The patient is status post hysterectomy.  IMPRESSION:  1.  Multiple sigmoid colon diverticula are noted.  There is some thickening of the sigmoid colon wall.  There is a collection with enhancing wall adjacent to sigmoid colon extending inferiorly to sigmoid colon measures at least 5.5 x 9.5 cm.  This is highly suspicious for pelvic diverticular abscess.  Clinical correlation is necessary.  Less likely multicystic ovarian neoplasm. Follow-up examination after treatment is recommended to assure resolution.  2.  No hydronephrosis or hydroureter. 3.  Osteopenia and degenerative changes thoracic spine. 4.  No pericecal inflammation.  No small bowel obstruction.  Original Report Authenticated By: Natasha Mead, M.D.    Medications:    . heparin  5,000 Units Subcutaneous Q8H  . heparin subcutaneous  5,000 Units Subcutaneous Q8H  . levothyroxine  88 mcg Oral Daily  . metoprolol tartrate  25 mg Oral Daily  . metronidazole  500 mg Intravenous Once  . omega-3 acid ethyl esters  1 g Oral BID  . pantoprazole (PROTONIX) IV  40 mg Intravenous QHS  . piperacillin-tazobactam (ZOSYN)  IV  3.375 g Intravenous Once  . piperacillin-tazobactam (ZOSYN)  IV  3.375 g Intravenous Q8H  . DISCONTD: fish oil-omega-3  fatty acids  1 g Oral BID  . DISCONTD: piperacillin-tazobactam (ZOSYN)  IV  3.375 g Intravenous Q8H    Assessment/Plan Diverticulitis sigmoid colon with multiloculated fluid collection/abscess. Last colonoscopy 10 years or more.  2. History of hypertension  3. History of hypothyroid on supplement  4. Dyslipidemia- treated  5. history of hand tremors, and lower extremity neuropathy.  6.  IR drainage of abscess 10/30/11 Dr. Fredia Sorrow    Plan:  Ice chips, otherwise npo, antibiotics and drainage     LOS: 1 day    Jeramiah Mccaughey 10/30/2011

## 2011-10-30 NOTE — Procedures (Signed)
Successful CT guided LLQ diverticular abscess 12 fr dr No comp Stable 120cc pus aspirated Gs/cx sent

## 2011-10-30 NOTE — Progress Notes (Signed)
General Surgery [facility] - Attending  Patient seen and examined.  Drain in LLQ with thick reddish brown output.  Mild tenderness.  BS present.  Will start CL diet tonight.  Velora Heckler, MD, [facility] Office: [phone]

## 2011-10-31 NOTE — Progress Notes (Signed)
Spoke with dtr and patient at bedside. Patient states much improved. Discussed d/c needs with patient and recommendation for [facility] RN for drain/wound care. Patient lives alone but is just next door to dtr, independent of ADL's prior to admission. Patient did own yard work prior to admission. Dtr is very supportive and able to assist as needed. Received orders for [facility] RN for drain care. Provided patient and dtr with list of [facility] agencies for choice. Will f/u tomorrow, provided contact information for questions.

## 2011-10-31 NOTE — Progress Notes (Signed)
General Surgery [facility] - Attending  Agree.  Will check CBC in AM 4/18.  Monitor drain output.  Follow up CT scan 2-3 days per IR.  Velora Heckler, MD, [facility] Office: [phone]

## 2011-10-31 NOTE — Progress Notes (Signed)
Subjective: Patient doing a little better today ; has some LLQ soreness at drain site; no nausea/vomiting; tolerating liquids; has been OOB; voiding ok, + BM  Objective: Vital signs in last 24 hours: Temp:  [97.5 F (36.4 C)-99.8 F (37.7 C)] 97.6 F (36.4 C) (04/17 0614) Pulse Rate:  [87-90] 87  (04/17 0614) Resp:  [17-19] 17  (04/17 0614) BP: (102-145)/(56-87) 138/57 mmHg (04/17 0614) SpO2:  [94 %-99 %] 98 % (04/17 0614) Last BM Date: 10/30/11  Intake/Output from previous day: 04/16 0701 - 04/17 0700 In: 3280 [I.V.:3275] Out: 580 [Urine:500; Drains:80] Intake/Output this shift:  LLQ drain intact, insertion site ok with small amount old blood around drain entry region, mild-mod tender to palpation, output 80 cc's purulent bloody fluid today, cx's pend; drain flushed with 5 cc's sterile NS without difficulty  Lab Results:   [facility] 10/30/11 0413 10/29/11 1815  WBC 20.3* 22.1*  HGB 11.2* 11.6*  HCT 34.0* 34.6*  PLT 622* 641*   BMET  Basename 10/30/11 0413 10/29/11 1815 10/29/11 1050  NA 132* -- 134*  K 3.6 -- 4.0  CL 96 -- 95*  CO2 25 -- 28  GLUCOSE 146* -- 137*  BUN 13 -- 20  CREATININE 0.70 0.67 --  CALCIUM 8.3* -- 9.3   PT/INR  Basename 10/30/11 0413  LABPROT 15.5*  INR 1.20   ABG No results found for this basename: PHART:2,PCO2:2,PO2:2,HCO3:2 in the last 72 hours Results for orders placed during the hospital encounter of 10/29/11  URINE CULTURE     Status: Normal   Collection Time   10/29/11 10:59 AM      Component Value Range Status Comment   Specimen Description URINE, CLEAN CATCH   Final    Special Requests NONE   Final    Culture  Setup Time [id number]   Final    Colony Count 30,000 COLONIES/ML   Final    Culture     Final    Value: Multiple bacterial morphotypes present, none predominant. Suggest appropriate recollection if clinically indicated.   Report Status 10/30/2011 FINAL   Final   CULTURE, ROUTINE-ABSCESS     Status: Normal  (Preliminary result)   Collection Time   10/30/11  9:39 AM      Component Value Range Status Comment   Specimen Description PERITONEAL CAVITY ABSCESS   Final    Special Requests NONE   Final    Gram Stain     Final    Value: NO WBC SEEN     NO SQUAMOUS EPITHELIAL CELLS SEEN     RARE GRAM NEGATIVE RODS   Culture NO GROWTH 1 DAY   Final    Report Status PENDING   Incomplete     Studies/Results: Ct Guided Abscess Drain  10/30/2011  *RADIOLOGY REPORT*  Clinical Data: Pelvic diverticular abscess  CT GUIDED LEFT PELVIC DIVERTICULAR ABSCESS 12-FRENCH DRAIN PLACEMENT  Date:  10/30/2011 00:30:00  Radiologist:  Judie Petit. Ruel Favors, M.D.  Medications:  1 mg Versed, 100 mcg Fentanyl  Guidance:  CT  Fluoroscopy time:  None.  Sedation time:  20 minutes  Contrast volume:  None.  Complications:  No immediate  PROCEDURE/FINDINGS:  Informed consent was obtained from the patient following explanation of the procedure, risks, benefits and alternatives. The patient understands, agrees and consents for the procedure. All questions were addressed.  A time out was performed.  Maximal barrier sterile technique utilized including caps, mask, sterile gowns, sterile gloves, large sterile drape, hand hygiene, and betadine  Previous imaging  reviewed.  The patient was positioned supine. Noncontrast localization CT performed through the pelvis.  Complex left pelvic abscess localized.  Under sterile conditions and local anesthesia, an 18 gauge access needle was advanced from an anterior approach into the collection.  Needle position confirmed with CT. Syringe aspiration yielded purulent fluid.  Sample sent for Gram stain and culture.  Guide wire advanced followed by tract dilatation to insert a 12-French drain.  Drain catheter position confirmed with CT.  Syringe aspiration yielded 120 ml purulent fluid.  Catheter secured with a Prolene suture and connected to external suction bulb.  Sterile dressing applied.  No immediate complication.   The patient tolerated the procedure well.  IMPRESSION: Successful CT guided left lower quadrant pelvic abscess drain placement.  Original Report Authenticated By: Judie Petit. Ruel Favors, M.D.   Ct Abdomen Pelvis W Contrast  10/29/2011  *RADIOLOGY REPORT*  Clinical Data:  left lower quadrant pain  CT ABDOMEN AND PELVIS WITH CONTRAST  Technique:  Multidetector CT imaging of the abdomen and pelvis was performed following the standard protocol during bolus administration of intravenous contrast.  Contrast: OMNIPAQUE IOHEXOL 300 MG/ML  SOLN  Comparison: 02/20/2006  Findings: Lung bases are unremarkable.  Sagittal images of the spine shows diffuse osteopenia.  Multilevel degenerative changes lumbar spine are noted.  Enhanced liver is unremarkable.  No intrahepatic biliary ductal dilatation.  Atherosclerotic calcifications of the abdominal aorta and the iliac arteries.  No evidence of aortic aneurysm.  The pancreas, spleen and adrenal glands are unremarkable.  Enhanced kidneys are symmetrical in size.  No hydronephrosis or hydroureter.  Delayed renal images shows bilateral renal symmetrical excretion.  A few diverticula are noted descending colon.  Multiple sigmoid colon diverticula are noted.  There is a oval-shaped multiloculated fluid collection in close proximity with the sigmoid colon in the left pelvis extending below the sigmoid colon.  This collection measures about 9.5 cm length by 5.5 cm thickness.  This probable represents a pelvic abscess or diverticular abscess.  Less likely a multiloculated cystic ovarian neoplasm.  Clinical correlation is necessary.  Some stool noted in the sigmoid colon.  The patient is status post hysterectomy.  IMPRESSION:  1.  Multiple sigmoid colon diverticula are noted.  There is some thickening of the sigmoid colon wall.  There is a collection with enhancing wall adjacent to sigmoid colon extending inferiorly to sigmoid colon measures at least 5.5 x 9.5 cm.  This is highly  suspicious for pelvic diverticular abscess.  Clinical correlation is necessary.  Less likely multicystic ovarian neoplasm. Follow-up examination after treatment is recommended to assure resolution.  2.  No hydronephrosis or hydroureter. 3.  Osteopenia and degenerative changes thoracic spine. 4.  No pericecal inflammation.  No small bowel obstruction.  Original Report Authenticated By: Natasha Mead, M.D.    Anti-infectives: Anti-infectives     Start     Dose/Rate Route Frequency Ordered Stop   10/29/11 2000   piperacillin-tazobactam (ZOSYN) IVPB 3.375 g        3.375 g 12.5 mL/hr over 240 Minutes Intravenous 3 times per day 10/29/11 1635     10/29/11 1800   piperacillin-tazobactam (ZOSYN) IVPB 3.375 g  Status:  Discontinued        3.375 g 12.5 mL/hr over 240 Minutes Intravenous 3 times per day 10/29/11 1620 10/29/11 1635   10/29/11 1230   piperacillin-tazobactam (ZOSYN) IVPB 3.375 g        3.375 g 12.5 mL/hr over 240 Minutes Intravenous  Once 10/29/11 1224 10/29/11  1643   10/29/11 1230   metroNIDAZOLE (FLAGYL) IVPB 500 mg        500 mg 100 mL/hr over 60 Minutes Intravenous  Once 10/29/11 1224 10/29/11 1436          Assessment/Plan: s/p LLQ/diverticular abscess drainage 4/16; check final cx's; cont drain NS flushes; check f/u CT once output diminishes; diet per CCS; ambulate.   LOS: 2 days    Lucee Brissett,D [facility] 10/31/2011

## 2011-10-31 NOTE — Progress Notes (Signed)
Subjective: Hurts only when she moves, has been up to bathroom but not much further.  Objective: Vital signs in last 24 hours: Temp:  [97.6 F (36.4 C)-99.8 F (37.7 C)] 97.6 F (36.4 C) (04/17 2130) Pulse Rate:  [87-90] 87  (04/17 0614) Resp:  [17-19] 17  (04/17 0614) BP: (116-145)/(56-87) 138/57 mmHg (04/17 0614) SpO2:  [97 %-99 %] 98 % (04/17 0614) Last BM Date: 10/30/11 80 ml thru drain yesterday, +BM, afebrile, VSS,no labs today Intake/Output from previous day: 04/16 0701 - 04/17 0700 In: 3280 [I.V.:3275] Out: 580 [Urine:500; Drains:80] Intake/Output this shift:    General appearance: alert, cooperative and no distress Resp: clear to auscultation bilaterally GI: soft, less tender, +BS, and BM.  Drainage is bloody/cloudy.  Lab Results:   [facility] 10/30/11 0413 10/29/11 1815  WBC 20.3* 22.1*  HGB 11.2* 11.6*  HCT 34.0* 34.6*  PLT 622* 641*    BMET  Basename 10/30/11 0413 10/29/11 1815 10/29/11 1050  NA 132* -- 134*  K 3.6 -- 4.0  CL 96 -- 95*  CO2 25 -- 28  GLUCOSE 146* -- 137*  BUN 13 -- 20  CREATININE 0.70 0.67 --  CALCIUM 8.3* -- 9.3   PT/INR  Basename 10/30/11 0413  LABPROT 15.5*  INR 1.20     Lab 10/30/11 0413 10/29/11 1050  AST 20 29  ALT 18 26  ALKPHOS 131* 161*  BILITOT 0.5 0.5  PROT 6.5 8.1  ALBUMIN 2.4* 3.1*     Lipase  No results found for this basename: lipase     Studies/Results: Ct Guided Abscess Drain  10/30/2011  *RADIOLOGY REPORT*  Clinical Data: Pelvic diverticular abscess  CT GUIDED LEFT PELVIC DIVERTICULAR ABSCESS 12-FRENCH DRAIN PLACEMENT  Date:  10/30/2011 00:30:00  Radiologist:  Judie Petit. Ruel Favors, M.D.  Medications:  1 mg Versed, 100 mcg Fentanyl  Guidance:  CT  Fluoroscopy time:  None.  Sedation time:  20 minutes  Contrast volume:  None.  Complications:  No immediate  PROCEDURE/FINDINGS:  Informed consent was obtained from the patient following explanation of the procedure, risks, benefits and alternatives. The patient  understands, agrees and consents for the procedure. All questions were addressed.  A time out was performed.  Maximal barrier sterile technique utilized including caps, mask, sterile gowns, sterile gloves, large sterile drape, hand hygiene, and betadine  Previous imaging reviewed.  The patient was positioned supine. Noncontrast localization CT performed through the pelvis.  Complex left pelvic abscess localized.  Under sterile conditions and local anesthesia, an 18 gauge access needle was advanced from an anterior approach into the collection.  Needle position confirmed with CT. Syringe aspiration yielded purulent fluid.  Sample sent for Gram stain and culture.  Guide wire advanced followed by tract dilatation to insert a 12-French drain.  Drain catheter position confirmed with CT.  Syringe aspiration yielded 120 ml purulent fluid.  Catheter secured with a Prolene suture and connected to external suction bulb.  Sterile dressing applied.  No immediate complication.  The patient tolerated the procedure well.  IMPRESSION: Successful CT guided left lower quadrant pelvic abscess drain placement.  Original Report Authenticated By: Judie Petit. Ruel Favors, M.D.   Ct Abdomen Pelvis W Contrast  10/29/2011  *RADIOLOGY REPORT*  Clinical Data:  left lower quadrant pain  CT ABDOMEN AND PELVIS WITH CONTRAST  Technique:  Multidetector CT imaging of the abdomen and pelvis was performed following the standard protocol during bolus administration of intravenous contrast.  Contrast: OMNIPAQUE IOHEXOL 300 MG/ML  SOLN  Comparison:  02/20/2006  Findings: Lung bases are unremarkable.  Sagittal images of the spine shows diffuse osteopenia.  Multilevel degenerative changes lumbar spine are noted.  Enhanced liver is unremarkable.  No intrahepatic biliary ductal dilatation.  Atherosclerotic calcifications of the abdominal aorta and the iliac arteries.  No evidence of aortic aneurysm.  The pancreas, spleen and adrenal glands are unremarkable.   Enhanced kidneys are symmetrical in size.  No hydronephrosis or hydroureter.  Delayed renal images shows bilateral renal symmetrical excretion.  A few diverticula are noted descending colon.  Multiple sigmoid colon diverticula are noted.  There is a oval-shaped multiloculated fluid collection in close proximity with the sigmoid colon in the left pelvis extending below the sigmoid colon.  This collection measures about 9.5 cm length by 5.5 cm thickness.  This probable represents a pelvic abscess or diverticular abscess.  Less likely a multiloculated cystic ovarian neoplasm.  Clinical correlation is necessary.  Some stool noted in the sigmoid colon.  The patient is status post hysterectomy.  IMPRESSION:  1.  Multiple sigmoid colon diverticula are noted.  There is some thickening of the sigmoid colon wall.  There is a collection with enhancing wall adjacent to sigmoid colon extending inferiorly to sigmoid colon measures at least 5.5 x 9.5 cm.  This is highly suspicious for pelvic diverticular abscess.  Clinical correlation is necessary.  Less likely multicystic ovarian neoplasm. Follow-up examination after treatment is recommended to assure resolution.  2.  No hydronephrosis or hydroureter. 3.  Osteopenia and degenerative changes thoracic spine. 4.  No pericecal inflammation.  No small bowel obstruction.  Original Report Authenticated By: Natasha Mead, M.D.    Medications:    . heparin subcutaneous  5,000 Units Subcutaneous Q8H  . levothyroxine  88 mcg Oral Daily  . metoprolol tartrate  25 mg Oral Daily  . omega-3 acid ethyl esters  1 g Oral BID  . pantoprazole (PROTONIX) IV  40 mg Intravenous QHS  . piperacillin-tazobactam (ZOSYN)  IV  3.375 g Intravenous Q8H    Assessment/Plan Diverticulitis sigmoid colon with multiloculated fluid collection/abscess. Last colonoscopy 10 years or more.  2. History of hypertension  3. History of hypothyroid on supplement  4. Dyslipidemia- treated  5. history of hand  tremors, and lower extremity neuropathy.  6. IR drainage of abscess 10/30/11 Dr. Fredia Sorrow   Plan:  Continue antibiotics, bowel rest and drain. Recheck labs am      LOS: 2 days    Mikayla Villanueva 10/31/2011

## 2011-10-31 NOTE — Progress Notes (Signed)
UR complete 

## 2011-11-01 LAB — BASIC METABOLIC PANEL
CO2: 26 mEq/L (ref 19–32)
Chloride: 98 mEq/L (ref 96–112)
GFR calc Af Amer: >90 mL/min (ref >90)
Potassium: 3.8 mEq/L (ref 3.5–5.1)

## 2011-11-01 LAB — CBC
HCT: 34.4 % — ABNORMAL LOW (ref 36.0–46.0)
Platelets: 649 10*3/uL — ABNORMAL HIGH (ref 150–400)
RBC: 3.84 MIL/uL — ABNORMAL LOW (ref 3.87–5.11)
RDW: 14.1 % (ref 11.5–15.5)
WBC: 13.4 10*3/uL — ABNORMAL HIGH (ref 4.0–10.5)

## 2011-11-01 NOTE — Progress Notes (Signed)
  Subjective: Feels much better even when she gets up it doesn't hurt like before.  Objective: Vital signs in last 24 hours: Temp:  [97.4 F (36.3 C)-98.9 F (37.2 C)] 97.4 F (36.3 C) (04/18 0900) Pulse Rate:  [71-91] 88  (04/18 0900) Resp:  [16-18] 18  (04/18 0900) BP: (124-149)/(51-69) 126/62 mmHg (04/18 0900) SpO2:  [95 %-99 %] 95 % (04/18 0900) Last BM Date: 11/01/11 50 ml thru drain, 960 PO, +BM, afebrile, VSS, labs show WBC improving. Intake/Output from previous day: 04/17 0701 - 04/18 0700 In: 3225 [P.O.:960; I.V.:2200; IV Piggyback:50] Out: 2801 [Urine:2750; Drains:50; Stool:1] Intake/Output this shift: Total I/O In: 240 [P.O.:240] Out: 250 [Urine:250]  General appearance: alert, cooperative and no distress Resp: clear to auscultation bilaterally GI: soft, non-tender; bowel sounds normal; no masses,  no organomegaly and Drain still putting out red cloudy fluid  Lab Results:   [facility] 11/01/11 0421 10/30/11 0413  WBC 13.4* 20.3*  HGB 11.1* 11.2*  HCT 34.4* 34.0*  PLT 649* 622*    BMET  Basename 11/01/11 0421 10/30/11 0413  NA 133* 132*  K 3.8 3.6  CL 98 96  CO2 26 25  GLUCOSE 132* 146*  BUN 7 13  CREATININE 0.62 0.70  CALCIUM 8.4 8.3*   PT/INR  Basename 10/30/11 0413  LABPROT 15.5*  INR 1.20     Lab 10/30/11 0413 10/29/11 1050  AST 20 29  ALT 18 26  ALKPHOS 131* 161*  BILITOT 0.5 0.5  PROT 6.5 8.1  ALBUMIN 2.4* 3.1*     Lipase  No results found for this basename: lipase     Studies/Results: No results found.  Medications:    . heparin subcutaneous  5,000 Units Subcutaneous Q8H  . levothyroxine  88 mcg Oral Daily  . metoprolol tartrate  25 mg Oral Daily  . omega-3 acid ethyl esters  1 g Oral BID  . pantoprazole (PROTONIX) IV  40 mg Intravenous QHS  . piperacillin-tazobactam (ZOSYN)  IV  3.375 g Intravenous Q8H    Assessment/Plan Diverticulitis sigmoid colon with multiloculated fluid collection/abscess. Last colonoscopy 10  years or more.  2. History of hypertension  3. History of hypothyroid on supplement  4. Dyslipidemia- treated  5. history of hand tremors, and lower extremity neuropathy.  6. IR drainage of abscess 10/30/11 Dr. Fredia Sorrow   Plan:  She would like to go to Full liquids.  Repeat CT when drainage is down to 10-15 cc per day.     LOS: 3 days    Mikayla Villanueva 11/01/2011

## 2011-11-01 NOTE — Progress Notes (Signed)
  Subjective: Pelvic abscess drain placed 4/16 Better Ambulating some  Objective: Vital signs in last 24 hours: Temp:  [97.4 F (36.3 C)-98.9 F (37.2 C)] 97.4 F (36.3 C) (04/18 0900) Pulse Rate:  [71-91] 88  (04/18 0900) Resp:  [16-18] 18  (04/18 0900) BP: (124-149)/(51-69) 126/62 mmHg (04/18 0900) SpO2:  [95 %-99 %] 95 % (04/18 0900) Last BM Date: 11/01/11  Intake/Output from previous day: 04/17 0701 - 04/18 0700 In: 3225 [P.O.:960; I.V.:2200; IV Piggyback:50] Out: 2801 [Urine:2750; Drains:50; Stool:1] Intake/Output this shift:    PE:  Afeb; VSS Wbc down- 13.4 50 cc output 4/17: bloody and purulent Cx: now growth still: final pending Site clean and dry: NT   Lab Results:   [facility] 11/01/11 0421 10/30/11 0413  WBC 13.4* 20.3*  HGB 11.1* 11.2*  HCT 34.4* 34.0*  PLT 649* 622*   BMET  Basename 11/01/11 0421 10/30/11 0413  NA 133* 132*  K 3.8 3.6  CL 98 96  CO2 26 25  GLUCOSE 132* 146*  BUN 7 13  CREATININE 0.62 0.70  CALCIUM 8.4 8.3*   PT/INR  Basename 10/30/11 0413  LABPROT 15.5*  INR 1.20   ABG No results found for this basename: PHART:2,PCO2:2,PO2:2,HCO3:2 in the last 72 hours  Studies/Results: No results found.  Anti-infectives: Anti-infectives     Start     Dose/Rate Route Frequency Ordered Stop   10/29/11 2000  piperacillin-tazobactam (ZOSYN) IVPB 3.375 g       3.375 g 12.5 mL/hr over 240 Minutes Intravenous 3 times per day 10/29/11 1635     10/29/11 1800   piperacillin-tazobactam (ZOSYN) IVPB 3.375 g  Status:  Discontinued        3.375 g 12.5 mL/hr over 240 Minutes Intravenous 3 times per day 10/29/11 1620 10/29/11 1635   10/29/11 1230  piperacillin-tazobactam (ZOSYN) IVPB 3.375 g       3.375 g 12.5 mL/hr over 240 Minutes Intravenous  Once 10/29/11 1224 10/29/11 1643   10/29/11 1230   metroNIDAZOLE (FLAGYL) IVPB 500 mg        500 mg 100 mL/hr over 60 Minutes Intravenous  Once 10/29/11 1224 10/29/11 1436           Assessment/Plan: S/p: pelvic abscess drain 4/16 Output still significant Will need CT when output diminished to less than 15 cc/24 hrs Can follow as OP if need  Micael Barb A 11/01/2011

## 2011-11-01 NOTE — Progress Notes (Signed)
Spoke with dtr and patient at bedside, f/u on [facility] choice. Dtr has chosen [facility] for [employer], contacted Darl Pikes with [facility] to arrange.

## 2011-11-01 NOTE — Progress Notes (Signed)
ANTIBIOTIC CONSULT NOTE - Follow Up  Pharmacy Consult for Zosyn Indication: Sigmoid diverticulitis and abscess   Allergies  Allergen Reactions  . Sulfa Antibiotics Hives  . Macrobid Hives   Patient Measurements: Height: 5\' 4"  (162.6 cm) Weight: 157 lb (71.215 kg) IBW/kg (Calculated) : 54.7   Vital Signs: Temp: 97.4 F (36.3 C) (04/18 0900) Temp src: Oral (04/18 0900) BP: 126/62 mmHg (04/18 0900) Pulse Rate: 88  (04/18 0900) Intake/Output from previous day: 04/17 0701 - 04/18 0700 In: 3225 [P.O.:960; I.V.:2200; IV Piggyback:50] Out: 2801 [Urine:2750; Drains:50; Stool:1] Intake/Output from this shift:    Labs:  Basename 11/01/11 0421 10/30/11 0413 10/29/11 1815  WBC 13.4* 20.3* 22.1*  HGB 11.1* 11.2* 11.6*  PLT 649* 622* 641*  LABCREA -- -- --  CREATININE 0.62 0.70 0.67   Estimated Creatinine Clearance: 47 ml/min (by C-G formula based on Cr of 0.62).    Microbiology: Recent Results (from the past 720 hour(s))  URINE CULTURE     Status: Normal   Collection Time   10/29/11 10:59 AM      Component Value Range Status Comment   Specimen Description URINE, CLEAN CATCH   Final    Special Requests NONE   Final    Culture  Setup Time [id number]   Final    Colony Count 30,000 COLONIES/ML   Final    Culture     Final    Value: Multiple bacterial morphotypes present, none predominant. Suggest appropriate recollection if clinically indicated.   Report Status 10/30/2011 FINAL   Final   CULTURE, ROUTINE-ABSCESS     Status: Normal (Preliminary result)   Collection Time   10/30/11  9:39 AM      Component Value Range Status Comment   Specimen Description PERITONEAL CAVITY ABSCESS   Final    Special Requests NONE   Final    Gram Stain     Final    Value: NO WBC SEEN     NO SQUAMOUS EPITHELIAL CELLS SEEN     RARE GRAM NEGATIVE RODS   Culture Culture reincubated for better growth   Final    Report Status PENDING   Incomplete   ANAEROBIC CULTURE     Status: Normal  (Preliminary result)   Collection Time   10/30/11  9:39 AM      Component Value Range Status Comment   Specimen Description PERITONEAL CAVITY ABSCESS   Final    Special Requests NONE   Final    Gram Stain PENDING   Incomplete    Culture     Final    Value: NO ANAEROBES ISOLATED; CULTURE IN PROGRESS FOR 5 DAYS   Report Status PENDING   Incomplete     Medical History: Past Medical History  Diagnosis Date  . Thyroid disease   . Hypertension   . Hyperlipemia   . Diverticulitis    Medications:  Prescriptions prior to admission  Medication Sig Dispense Refill  . acetaminophen (TYLENOL) 500 MG tablet Take 1,000 mg by mouth 2 (two) times daily as needed. For pain      . fish oil-omega-3 fatty acids 1000 MG capsule Take 1 g by mouth 2 (two) times daily.       Marland Kitchen levothyroxine (SYNTHROID, LEVOTHROID) 88 MCG tablet Take 88 mcg by mouth daily.      Marland Kitchen lisinopril (PRINIVIL,ZESTRIL) 20 MG tablet Take 20 mg by mouth daily.      . meloxicam (MOBIC) 7.5 MG tablet Take 7.5 mg by mouth daily.      Marland Kitchen  metoprolol tartrate (LOPRESSOR) 25 MG tablet Take 25 mg by mouth daily.      . simvastatin (ZOCOR) 40 MG tablet Take 40 mg by mouth every evening.       Anti-infectives     Start     Dose/Rate Route Frequency Ordered Stop   10/29/11 2000   piperacillin-tazobactam (ZOSYN) IVPB 3.375 g        3.375 g 12.5 mL/hr over 240 Minutes Intravenous 3 times per day 10/29/11 1635     10/29/11 1800   piperacillin-tazobactam (ZOSYN) IVPB 3.375 g  Status:  Discontinued        3.375 g 12.5 mL/hr over 240 Minutes Intravenous 3 times per day 10/29/11 1620 10/29/11 1635   10/29/11 1230   piperacillin-tazobactam (ZOSYN) IVPB 3.375 g        3.375 g 12.5 mL/hr over 240 Minutes Intravenous  Once 10/29/11 1224 10/29/11 1643   10/29/11 1230   metroNIDAZOLE (FLAGYL) IVPB 500 mg        500 mg 100 mL/hr over 60 Minutes Intravenous  Once 10/29/11 1224 10/29/11 1436         Assessment:  78yo F admitted 4/15 with  abdominal pain. CT revealed diverticulitis and abscess. Abscess drained 4/16, cx = GNR, speciation and sensitivities pending  Day # 4 Zosyn 3.375g IV q8h, each dose over 4 hours  SCr stable, CrCl 47, 55N.    Goal of Therapy:  Appropriate regimen for renal fxn  Plan:   Continue Zosyn 3.375g IV Q8H infused over 4hrs.  F/u cultures  Gwen Her PharmD  [phone] 11/01/2011 9:39 AM

## 2011-11-01 NOTE — Progress Notes (Signed)
General Surgery [facility] - Attending  Patient seen and examined.  Drainage reddish brown.  Minimal abdominal tenderness.  WBC decreased.  Will advance diet to full liquids.  Ambulate.  Velora Heckler, MD, [facility] Office: [phone]

## 2011-11-02 LAB — CULTURE, ROUTINE-ABSCESS: Gram Stain: NONE SEEN

## 2011-11-02 NOTE — Progress Notes (Signed)
General Surgery [facility] - Attending  Percutaneous drain in place with continued output.  IV abx.  Timing of repeat CT scan per IR.  Making continued progress.  Check CBC in AM 4/20.  Velora Heckler, MD, [facility] Office: [phone]

## 2011-11-02 NOTE — Plan of Care (Signed)
Problem: Food- and Nutrition-Related Knowledge Deficit (NB-1.1) Goal: Nutrition education Formal process to instruct or train a patient/client in a skill or to impart knowledge to help patients/clients voluntarily manage or modify food choices and eating behavior to maintain or improve health.  Outcome: Completed/Met Date Met:  11/02/11 Met with pt and family to discuss low fiber diet. Discussed relationship between fiber and diverticulitis. Reviewed sources of fiber in diet and recommended low fiber food options. Discussed low fiber meal plans. Provided pt with handout of this information. Pt and family expressed understanding. RD contact information provided for any further questions.

## 2011-11-02 NOTE — Progress Notes (Signed)
  Subjective: Pt ok. Tol fulls. Still some soreness LLQ  Objective: Vital signs in last 24 hours: Temp:  [97.6 F (36.4 C)-97.9 F (36.6 C)] 97.8 F (36.6 C) (04/19 0600) Pulse Rate:  [64-84] 75  (04/19 0600) Resp:  [18-19] 19  (04/19 0600) BP: (125-146)/(71-81) 144/74 mmHg (04/19 0600) SpO2:  [95 %-97 %] 95 % (04/19 0600) Last BM Date: 11/01/11  Intake/Output this shift:    Physical Exam: BP 144/74  Pulse 75  Temp(Src) 97.8 F (36.6 C) (Oral)  Resp 19  Ht 5\' 4"  (1.626 m)  Wt 157 lb (71.215 kg)  BMI 26.95 kg/m2  SpO2 95% Mild LLQ tenderness. Drain site clean, intact.  Bloody purulent output. Looks like none recorded yesterday but about 5-10cc in bulb now.  Labs: CBC  Basename 11/01/11 0421  WBC 13.4*  HGB 11.1*  HCT 34.4*  PLT 649*   BMET  Basename 11/01/11 0421  NA 133*  K 3.8  CL 98  CO2 26  GLUCOSE 132*  BUN 7  CREATININE 0.62  CALCIUM 8.4   LFT No results found for this basename: PROT,ALBUMIN,AST,ALT,ALKPHOS,BILITOT,BILIDIR,IBILI,LIPASE in the last 72 hours PT/INR No results found for this basename: LABPROT:2,INR:2 in the last 72 hours ABG No results found for this basename: PHART:2,PCO2:2,PO2:2,HCO3:2 in the last 72 hours  Studies/Results: No results found.  Assessment/Plan: Sigmoid diverticulitis with abscess S/p perc drain 4/16 Making progress, output slowing. Will cont to follow.    LOS: 4 days    Brayton El PA-C 11/02/2011 9:10 AM

## 2011-11-02 NOTE — Progress Notes (Signed)
  Subjective: Making slow progress, sore in morning after getting up, no pain meds, but she does well when she does walk.    Objective: Vital signs in last 24 hours: Temp:  [97.6 F (36.4 C)-97.9 F (36.6 C)] 97.8 F (36.6 C) (04/19 0600) Pulse Rate:  [64-84] 75  (04/19 0600) Resp:  [18-19] 19  (04/19 0600) BP: (125-146)/(71-81) 144/74 mmHg (04/19 0600) SpO2:  [95 %-97 %] 95 % (04/19 0600) Last BM Date: 11/01/11  No drainage recorded from drain yesterday, she says they emptied it a few times yesterday. Intake/Output from previous day: 04/18 0701 - 04/19 0700 In: 1740 [P.O.:240; I.V.:1400; IV Piggyback:100] Out: 750 [Urine:750] Intake/Output this shift: Total I/O In: 180 [P.O.:180] Out: 200 [Urine:200]  General appearance: alert, cooperative and no distress Resp: clear to auscultation bilaterally GI: soft, mildly-tender LLQ ; bowel sounds normal; no masses,  no organomegaly and drain has about 5 ml of red cloudy fluid in it.  Lab Results:   [facility] 11/01/11 0421  WBC 13.4*  HGB 11.1*  HCT 34.4*  PLT 649*    BMET  Basename 11/01/11 0421  NA 133*  K 3.8  CL 98  CO2 26  GLUCOSE 132*  BUN 7  CREATININE 0.62  CALCIUM 8.4   PT/INR No results found for this basename: LABPROT:2,INR:2 in the last 72 hours   Lab 10/30/11 0413 10/29/11 1050  AST 20 29  ALT 18 26  ALKPHOS 131* 161*  BILITOT 0.5 0.5  PROT 6.5 8.1  ALBUMIN 2.4* 3.1*     Lipase  No results found for this basename: lipase     Studies/Results: No results found.  Medications:    . heparin subcutaneous  5,000 Units Subcutaneous Q8H  . levothyroxine  88 mcg Oral Daily  . metoprolol tartrate  25 mg Oral Daily  . omega-3 acid ethyl esters  1 g Oral BID  . pantoprazole (PROTONIX) IV  40 mg Intravenous QHS  . piperacillin-tazobactam (ZOSYN)  IV  3.375 g Intravenous Q8H    Assessment/Plan Diverticulitis sigmoid colon with multiloculated fluid collection/abscess. Last colonoscopy 10 years or  more.  2. History of hypertension  3. History of hypothyroid on supplement  4. Dyslipidemia- treated  5. history of hand tremors, and lower extremity neuropathy.  6. IR drainage of abscess 10/30/11 Dr. Fredia Sorrow  Plan:  I've ask the nursing staff to be sure drainage is recorded.  We might be able to repeat CT this weekend and see if we can transition to oral antibiotics and home.      LOS: 4 days    Corean Yoshimura 11/02/2011

## 2011-11-03 LAB — CBC
HCT: 35.7 % — ABNORMAL LOW (ref 36.0–46.0)
MCH: 29.1 pg (ref 26.0–34.0)
MCHC: 32.2 g/dL (ref 30.0–36.0)
RDW: 14 % (ref 11.5–15.5)

## 2011-11-03 NOTE — Progress Notes (Signed)
  Subjective: Tolerating diet.  Moving bowels  Objective: Vital signs in last 24 hours: Temp:  [97.3 F (36.3 C)-98 F (36.7 C)] 97.3 F (36.3 C) (04/20 0500) Pulse Rate:  [73-79] 73  (04/20 0500) Resp:  [18] 18  (04/20 0500) BP: (138-143)/(68-77) 138/68 mmHg (04/20 0500) SpO2:  [97 %-100 %] 100 % (04/20 0500) Last BM Date: 11/03/11  Intake/Output from previous day: 04/19 0701 - 04/20 0700 In: 1985.7 [P.O.:180; I.V.:1695.7; IV Piggyback:100] Out: 540 [Urine:525; Drains:15] Intake/Output this shift:    General appearance: alert, cooperative and no distress Resp: clear to auscultation bilaterally Cardio: regular rate and rhythm, S1, S2 normal, no murmur, click, rub or gallop GI: soft, minimal LLQ tenderness, ND, JP with ss output   Lab Results:   Basename 11/03/11 0422 11/01/11 0421  WBC 12.4* 13.4*  HGB 11.5* 11.1*  HCT 35.7* 34.4*  PLT 702* 649*   BMET  Basename 11/01/11 0421  NA 133*  K 3.8  CL 98  CO2 26  GLUCOSE 132*  BUN 7  CREATININE 0.62  CALCIUM 8.4   PT/INR No results found for this basename: LABPROT:2,INR:2 in the last 72 hours ABG No results found for this basename: PHART:2,PCO2:2,PO2:2,HCO3:2 in the last 72 hours  Studies/Results: No results found.  Anti-infectives: Anti-infectives     Start     Dose/Rate Route Frequency Ordered Stop   10/29/11 2000   piperacillin-tazobactam (ZOSYN) IVPB 3.375 g        3.375 g 12.5 mL/hr over 240 Minutes Intravenous 3 times per day 10/29/11 1635     10/29/11 1800   piperacillin-tazobactam (ZOSYN) IVPB 3.375 g  Status:  Discontinued        3.375 g 12.5 mL/hr over 240 Minutes Intravenous 3 times per day 10/29/11 1620 10/29/11 1635   10/29/11 1230   piperacillin-tazobactam (ZOSYN) IVPB 3.375 g        3.375 g 12.5 mL/hr over 240 Minutes Intravenous  Once 10/29/11 1224 10/29/11 1643   10/29/11 1230   metroNIDAZOLE (FLAGYL) IVPB 500 mg        500 mg 100 mL/hr over 60 Minutes Intravenous  Once 10/29/11  1224 10/29/11 1436          Assessment/Plan: s/p * No surgery found * Drain clearing now with small amount of SS output.  Likely plan for CT tomorrow to eval fluid collection, wbc trending down,  plan to repeat ct and transition to oral abx tomorrow  LOS: 5 days    Lodema Pilot DAVID 11/03/2011

## 2011-11-03 NOTE — Progress Notes (Signed)
Subjective: Pt sitting up in chair ; feeling better; tol liquid diet; no increased abd pain, N/V; has ambulated in hallway  Objective: Vital signs in last 24 hours: Temp:  [97.3 F (36.3 C)-98 F (36.7 C)] 97.3 F (36.3 C) (04/20 0500) Pulse Rate:  [73-92] 92  (04/20 0922) Resp:  [18] 18  (04/20 0500) BP: (121-143)/(68-78) 121/78 mmHg (04/20 0922) SpO2:  [97 %-100 %] 100 % (04/20 0500) Last BM Date: 11/03/11  Intake/Output from previous day: 04/19 0701 - 04/20 0700 In: 1985.7 [P.O.:180; I.V.:1695.7; IV Piggyback:100] Out: 540 [Urine:525; Drains:15] Intake/Output this shift:    LLQ drain intact, insertion site ok, mildly tender, draining about 15 cc's blood-tinged fluid, cx's -strept  Lab Results:   Basename 11/03/11 0422 11/01/11 0421  WBC 12.4* 13.4*  HGB 11.5* 11.1*  HCT 35.7* 34.4*  PLT 702* 649*   BMET  Basename 11/01/11 0421  NA 133*  K 3.8  CL 98  CO2 26  GLUCOSE 132*  BUN 7  CREATININE 0.62  CALCIUM 8.4   PT/INR No results found for this basename: LABPROT:2,INR:2 in the last 72 hours ABG No results found for this basename: PHART:2,PCO2:2,PO2:2,HCO3:2 in the last 72 hours  Studies/Results: No results found. Results for orders placed during the hospital encounter of 10/29/11  URINE CULTURE     Status: Normal   Collection Time   10/29/11 10:59 AM      Component Value Range Status Comment   Specimen Description URINE, CLEAN CATCH   Final    Special Requests NONE   Final    Culture  Setup Time [id number]   Final    Colony Count 30,000 COLONIES/ML   Final    Culture     Final    Value: Multiple bacterial morphotypes present, none predominant. Suggest appropriate recollection if clinically indicated.   Report Status 10/30/2011 FINAL   Final   CULTURE, ROUTINE-ABSCESS     Status: Normal   Collection Time   10/30/11  9:39 AM      Component Value Range Status Comment   Specimen Description PERITONEAL CAVITY ABSCESS   Final    Special Requests NONE    Final    Gram Stain     Final    Value: NO WBC SEEN     NO SQUAMOUS EPITHELIAL CELLS SEEN     RARE GRAM NEGATIVE RODS   Culture     Final    Value: MODERATE MICROAEROPHILIC STREPTOCOCCI     Note: Standardized susceptibility testing for this organism is not available.   Report Status 11/02/2011 FINAL   Final   ANAEROBIC CULTURE     Status: Normal (Preliminary result)   Collection Time   10/30/11  9:39 AM      Component Value Range Status Comment   Specimen Description PERITONEAL CAVITY ABSCESS   Final    Special Requests NONE   Final    Gram Stain PENDING   Incomplete    Culture     Final    Value: NO ANAEROBES ISOLATED; CULTURE IN PROGRESS FOR 5 DAYS   Report Status PENDING   Incomplete     Anti-infectives: Anti-infectives     Start     Dose/Rate Route Frequency Ordered Stop   10/29/11 2000   piperacillin-tazobactam (ZOSYN) IVPB 3.375 g        3.375 g 12.5 mL/hr over 240 Minutes Intravenous 3 times per day 10/29/11 1635     10/29/11 1800   piperacillin-tazobactam (ZOSYN) IVPB 3.375 g  Status:  Discontinued        3.375 g 12.5 mL/hr over 240 Minutes Intravenous 3 times per day 10/29/11 1620 10/29/11 1635   10/29/11 1230   piperacillin-tazobactam (ZOSYN) IVPB 3.375 g        3.375 g 12.5 mL/hr over 240 Minutes Intravenous  Once 10/29/11 1224 10/29/11 1643   10/29/11 1230   metroNIDAZOLE (FLAGYL) IVPB 500 mg        500 mg 100 mL/hr over 60 Minutes Intravenous  Once 10/29/11 1224 10/29/11 1436          Assessment/Plan: s/p LLQ diverticular abscess drainage 4/16; check CT 4/21 with drain injection to r/o fistula   LOS: 5 days    Jerald Villalona,D [facility] 11/03/2011

## 2011-11-04 ENCOUNTER — Inpatient Hospital Stay (HOSPITAL_COMMUNITY): Payer: Medicare Other

## 2011-11-04 LAB — ANAEROBIC CULTURE

## 2011-11-04 MED ORDER — PANTOPRAZOLE SODIUM 40 MG PO TBEC
40.0000 mg | DELAYED_RELEASE_TABLET | Freq: Every day | ORAL | Status: DC
  Administered 2011-11-04: 40 mg via ORAL
  Filled 2011-11-04 (×2): qty 1

## 2011-11-04 MED ORDER — AMOXICILLIN-POT CLAVULANATE 875-125 MG PO TABS
1.0000 | ORAL_TABLET | Freq: Two times a day (BID) | ORAL | Status: DC
  Administered 2011-11-04 – 2011-11-05 (×3): 1 via ORAL
  Filled 2011-11-04 (×4): qty 1

## 2011-11-04 MED ORDER — IOHEXOL 300 MG/ML  SOLN
100.0000 mL | Freq: Once | INTRAMUSCULAR | Status: AC | PRN
  Administered 2011-11-04: 100 mL via INTRAVENOUS

## 2011-11-04 NOTE — Progress Notes (Signed)
This patient is receiving IV Protonix. Based on criteria approved by the Pharmacy and Therapeutics Committee, this medication is being converted to the equivalent oral dose form. These criteria include: . The patient is eating (either orally or per tube) and/or has been taking other orally administered medications for at least 24 hours. . This patient has no evidence of active gastrointestinal bleeding or impaired GI absorption (gastrectomy, short bowel, patient on TNA or NPO).  If you have questions about this conversion, please contact the pharmacy department. Thank you.  Clance Boll, PharmD, BCPS Pager: [phone] 11/04/2011 7:47 AM

## 2011-11-04 NOTE — Progress Notes (Signed)
  Subjective: Pain improving, tolerating diet.  Objective: Vital signs in last 24 hours: Temp:  [97.5 F (36.4 C)-98.1 F (36.7 C)] 97.5 F (36.4 C) (04/21 0500) Pulse Rate:  [70-92] 70  (04/21 0500) Resp:  [18] 18  (04/21 0500) BP: (121-146)/(55-78) 137/55 mmHg (04/21 0500) SpO2:  [98 %-99 %] 98 % (04/21 0500) Last BM Date: 11/03/11  Intake/Output from previous day: 04/20 0701 - 04/21 0700 In: 535 [P.O.:480; IV Piggyback:50] Out: 760 [Urine:750; Drains:10] Intake/Output this shift:    General appearance: alert, cooperative and no distress GI: soft, mild LLQ tenderness, nd, jp ss output  Lab Results:   Basename 11/03/11 0422  WBC 12.4*  HGB 11.5*  HCT 35.7*  PLT 702*   BMET No results found for this basename: NA:2,K:2,CL:2,CO2:2,GLUCOSE:2,BUN:2,CREATININE:2,CALCIUM:2 in the last 72 hours PT/INR No results found for this basename: LABPROT:2,INR:2 in the last 72 hours ABG No results found for this basename: PHART:2,PCO2:2,PO2:2,HCO3:2 in the last 72 hours  Studies/Results: No results found.  Anti-infectives: Anti-infectives     Start     Dose/Rate Route Frequency Ordered Stop   11/04/11 1000   amoxicillin-clavulanate (AUGMENTIN) 875-125 MG per tablet 1 tablet        1 tablet Oral Every 12 hours 11/04/11 0735     10/29/11 2000   piperacillin-tazobactam (ZOSYN) IVPB 3.375 g  Status:  Discontinued        3.375 g 12.5 mL/hr over 240 Minutes Intravenous 3 times per day 10/29/11 1635 11/04/11 0735   10/29/11 1800   piperacillin-tazobactam (ZOSYN) IVPB 3.375 g  Status:  Discontinued        3.375 g 12.5 mL/hr over 240 Minutes Intravenous 3 times per day 10/29/11 1620 10/29/11 1635   10/29/11 1230   piperacillin-tazobactam (ZOSYN) IVPB 3.375 g        3.375 g 12.5 mL/hr over 240 Minutes Intravenous  Once 10/29/11 1224 10/29/11 1643   10/29/11 1230   metroNIDAZOLE (FLAGYL) IVPB 500 mg        500 mg 100 mL/hr over 60 Minutes Intravenous  Once 10/29/11 1224 10/29/11  1436          Assessment/Plan: s/p * No surgery found * will plan for repeat CT abdomen to evaluate abscess prior to drain removal, change to oral abx and if abscess resolved, then plan for discharge tomorrow.  LOS: 6 days    Lodema Pilot DAVID 11/04/2011

## 2011-11-04 NOTE — Progress Notes (Signed)
  Subjective: Pt without new c/o ; en route to radiology dept for f/u CT A/P to assess pelvic abscess  Objective: Vital signs in last 24 hours: Temp:  [97.5 F (36.4 C)-98.1 F (36.7 C)] 97.5 F (36.4 C) (04/21 0500) Pulse Rate:  [70-79] 74  (04/21 1001) Resp:  [18] 18  (04/21 0500) BP: (129-146)/(55-69) 129/64 mmHg (04/21 1001) SpO2:  [98 %-99 %] 98 % (04/21 0500) Last BM Date: 11/03/11  Intake/Output from previous day: 04/20 0701 - 04/21 0700 In: 535 [P.O.:480; IV Piggyback:50] Out: 760 [Urine:750; Drains:10] Intake/Output this shift: Total I/O In: 360 [P.O.:360] Out: -   LLQ drain intact, output about 10 cc's today  Lab Results:   Basename 11/03/11 0422  WBC 12.4*  HGB 11.5*  HCT 35.7*  PLT 702*   BMET No results found for this basename: NA:2,K:2,CL:2,CO2:2,GLUCOSE:2,BUN:2,CREATININE:2,CALCIUM:2 in the last 72 hours PT/INR No results found for this basename: LABPROT:2,INR:2 in the last 72 hours ABG No results found for this basename: PHART:2,PCO2:2,PO2:2,HCO3:2 in the last 72 hours  Studies/Results: No results found.  Anti-infectives: Anti-infectives     Start     Dose/Rate Route Frequency Ordered Stop   11/04/11 1200   amoxicillin-clavulanate (AUGMENTIN) 875-125 MG per tablet 1 tablet        1 tablet Oral Every 12 hours 11/04/11 0735     10/29/11 2000   piperacillin-tazobactam (ZOSYN) IVPB 3.375 g  Status:  Discontinued        3.375 g 12.5 mL/hr over 240 Minutes Intravenous 3 times per day 10/29/11 1635 11/04/11 0735   10/29/11 1800   piperacillin-tazobactam (ZOSYN) IVPB 3.375 g  Status:  Discontinued        3.375 g 12.5 mL/hr over 240 Minutes Intravenous 3 times per day 10/29/11 1620 10/29/11 1635   10/29/11 1230   piperacillin-tazobactam (ZOSYN) IVPB 3.375 g        3.375 g 12.5 mL/hr over 240 Minutes Intravenous  Once 10/29/11 1224 10/29/11 1643   10/29/11 1230   metroNIDAZOLE (FLAGYL) IVPB 500 mg        500 mg 100 mL/hr over 60 Minutes  Intravenous  Once 10/29/11 1224 10/29/11 1436          Assessment/Plan: S/p pelvic/diverticular abscess drainage 4/16; check f/u CT today; rec drain injection on 4/22 to r/o fistula before consideration given to remove drain   LOS: 6 days    Mikayla Villanueva,D [facility] 11/04/2011

## 2011-11-05 ENCOUNTER — Inpatient Hospital Stay (HOSPITAL_COMMUNITY): Payer: Medicare Other

## 2011-11-05 LAB — COMPREHENSIVE METABOLIC PANEL
ALT: 26 U/L (ref 0–35)
AST: 30 U/L (ref 0–37)
Albumin: 2.9 g/dL — ABNORMAL LOW (ref 3.5–5.2)
CO2: 27 mEq/L (ref 19–32)
Calcium: 9 mg/dL (ref 8.4–10.5)
Creatinine, Ser: 0.77 mg/dL (ref 0.50–1.10)
Sodium: 135 mEq/L (ref 135–145)
Total Protein: 7.3 g/dL (ref 6.0–8.3)

## 2011-11-05 LAB — CBC
MCH: 29.5 pg (ref 26.0–34.0)
MCHC: 32.7 g/dL (ref 30.0–36.0)
MCV: 90.2 fL (ref 78.0–100.0)
Platelets: 689 10*3/uL — ABNORMAL HIGH (ref 150–400)
RBC: 4.27 MIL/uL (ref 3.87–5.11)
RDW: 14.3 % (ref 11.5–15.5)

## 2011-11-05 MED ORDER — CIPROFLOXACIN HCL 500 MG PO TABS: 500.0000 mg | ORAL_TABLET | Freq: Two times a day (BID) | ORAL | Status: AC

## 2011-11-05 MED ORDER — METRONIDAZOLE 500 MG PO TABS
500.0000 mg | ORAL_TABLET | Freq: Three times a day (TID) | ORAL | Status: DC
  Administered 2011-11-05: 500 mg via ORAL
  Filled 2011-11-05 (×3): qty 1

## 2011-11-05 MED ORDER — CIPROFLOXACIN HCL 500 MG PO TABS
500.0000 mg | ORAL_TABLET | Freq: Two times a day (BID) | ORAL | Status: DC
  Administered 2011-11-05: 500 mg via ORAL
  Filled 2011-11-05 (×2): qty 1

## 2011-11-05 MED ORDER — METRONIDAZOLE 500 MG PO TABS: 500.0000 mg | ORAL_TABLET | Freq: Three times a day (TID) | ORAL | Status: AC

## 2011-11-05 MED ORDER — HYDROCODONE-ACETAMINOPHEN 5-325 MG PO TABS: 1.0000 | ORAL_TABLET | Freq: Four times a day (QID) | ORAL | Status: AC | PRN

## 2011-11-05 NOTE — Progress Notes (Signed)
Subjective: I sure want to go home.  Eating a low residual diet.    Objective: Vital signs in last 24 hours: Temp:  [97.6 F (36.4 C)-98.3 F (36.8 C)] 97.6 F (36.4 C) (04/22 0606) Pulse Rate:  [74-82] 75  (04/22 0606) Resp:  [18] 18  (04/22 0606) BP: (108-145)/(64-82) 145/82 mmHg (04/22 0606) SpO2:  [97 %-100 %] 100 % (04/22 0606) Last BM Date: 11/04/11 Afebrile, VSS, WBC still up 4/20, CT as noted. Intake/Output from previous day: 04/21 0701 - 04/22 0700 In: 970 [P.O.:960] Out: 35 [Urine:25; Drains:10] Intake/Output this shift:    General appearance: alert, cooperative and no distress Resp: clear to auscultation bilaterally GI: soft, non-tender; bowel sounds normal; no masses,  no organomegaly Drain shows bloody, less purulent drainage.  Lab Results:   Basename 11/03/11 0422  WBC 12.4*  HGB 11.5*  HCT 35.7*  PLT 702*    BMET No results found for this basename: NA:2,K:2,CL:2,CO2:2,GLUCOSE:2,BUN:2,CREATININE:2,CALCIUM:2 in the last 72 hours PT/INR No results found for this basename: LABPROT:2,INR:2 in the last 72 hours   Lab 10/30/11 0413 10/29/11 1050  AST 20 29  ALT 18 26  ALKPHOS 131* 161*  BILITOT 0.5 0.5  PROT 6.5 8.1  ALBUMIN 2.4* 3.1*     Lipase  No results found for this basename: lipase     Studies/Results: Ct Abdomen Pelvis W Contrast  11/04/2011  *RADIOLOGY REPORT*  Clinical Data: History of diverticular abscess with drain placement.  Evaluate drain.  CT ABDOMEN AND PELVIS WITH CONTRAST  Technique:  Multidetector CT imaging of the abdomen and pelvis was performed following the standard protocol during bolus administration of intravenous contrast.  Contrast: OMNIPAQUE IOHEXOL 300 MG/ML  SOLN  Comparison: 10/29/2011 and drain placement CT of 10/30/2011  Findings: Mild nonspecific interstitial thickening at the lung bases.  Probable subpleural lymph node at 2 mm on image 3.  Mild cardiomegaly.  Small hiatal hernia with small para esophageal  nodes, likely reactive. No pericardial or pleural effusion. Prominent right lobe of the liver.  No focal liver lesion.  Normal spleen, remainder of stomach, pancreas, gallbladder, biliary tract.  Normal adrenal glands and kidneys.  Mild non aneurysmal dilatation of the infrarenal aorta, 2.3 cm. No retroperitoneal or retrocrural adenopathy.  Extensive colonic diverticulosis.  Most marked in the sigmoid region.  Wall thickening is improved and likely related to muscular hypertrophy.  The percutaneous drain is positioned in the region of the sigmoid.  No dominant residual fluid collection is identified. There is minimal low density with peripheral enhancement laterally, 1.4 cm on image 55.  Possibly residual inflamed diverticulum.  On the prior, this measures 2.1 cm (image 57).  In addition, more anteriorly (along the tract of the catheter), is a extraluminal ill-defined fluid and gas collection which measures 3.6 x 1.8 cm on image 54.  Only mild peripheral enhancement at this site.  Normal terminal ileum and appendix.  Normal small bowel without abdominal ascites.    No pelvic adenopathy.  The bladder is mildly displaced to the right.  No air within.  Suspect mild secondary pericystic edema/inflammation.  Image 56.  Hysterectomy. No significant free fluid.   Moderate osteopenia.  L3 superior endplate deformity is favored to represent moderate compression.  Schmorl's node deformity felt less likely.  There is also a moderate T12 compression deformity.  IMPRESSION: 1.  Interval placement of a percutaneous drain into the previously described diverticular abscess. 2.  Marked improvement in perisigmoid fluid collections.  One anterior ill-defined collection and  one tiny nearly resolved lateral collection remain. 3.  Probable secondary cystitis. 4.  Osteopenia with definite T12 and probable L3 compression deformities. Per CMS PQRS reporting requirements (PQRS Measure 24): Given the patient's age of greater than 50 and the  fracture site (hip, distal radius, or spine), the patient should be tested for osteoporosis using DXA, and the appropriate treatment considered based on the DXA results.  Original Report Authenticated By: Consuello Bossier, M.D.    Medications:    . amoxicillin-clavulanate  1 tablet Oral Q12H  . heparin subcutaneous  5,000 Units Subcutaneous Q8H  . levothyroxine  88 mcg Oral Daily  . metoprolol tartrate  25 mg Oral Daily  . omega-3 acid ethyl esters  1 g Oral BID  . pantoprazole  40 mg Oral QHS  . DISCONTD: pantoprazole (PROTONIX) IV  40 mg Intravenous QHS  . DISCONTD: piperacillin-tazobactam (ZOSYN)  IV  3.375 g Intravenous Q8H    Assessment/Plan Diverticulitis sigmoid colon with multiloculated fluid collection/abscess. Last colonoscopy 10 years or more. Improved on CT 4/21, probable cystitis. 2. History of hypertension  3. History of hypothyroid on supplement  4. Dyslipidemia- treated  5. history of hand tremors, and lower extremity neuropathy.  6. IR drainage of abscess 10/30/11 Dr. Fredia Sorrow  Plan:  We will see what IR recommends with drain. Possible home with it,  And ask if they want it irrigated at home.  I am going to recheck labs.  She denies problems voiding but does have back discomfort.  CT, shows a probable Compression fx. L3, & osteopenia.      LOS: 7 days    Mikayla Villanueva 11/05/2011

## 2011-11-05 NOTE — Discharge Instructions (Signed)
Low Fiber and Residue Restricted Diet A low fiber diet restricts foods that contain carbohydrates that are not digested in the small intestine. A diet containing about 10 g of fiber is considered low fiber. The diet needs to be individualized to suit patient tolerances and preferences and to avoid unnecessary restrictions. Generally, the foods emphasized in a low fiber diet have no skins or seeds. They may have been processed to remove bran, germ, or husks. Cooking may not necessarily eliminate the fiber. Cooking may, in fact, enable a greater quantity of fiber to be consumed in a lesser volume. Legumes and nuts are also restricted. The term low residue has also been used to describe low fiber diets, although the two are not the same. Residue refers to any substance that adds to bowel (colonic) contents, such as sloughed cells and intestinal bacteria, in addition to fiber. Residue-containing foods, prunes and prune juice, milk, and connective tissue from meats may also need to be eliminated. It is important to eliminate these foods during sudden (acute) attacks of inflammatory bowel disease, when there is a partial obstruction due to another reason, or when minimal fecal output is desired. When these problems are gone, a more normal diet may be used. PURPOSE  Prevent blockage of a partially obstructed or narrowed gastrointestinal tract.   Reduce stool weight and volume.   Slow the movement of waste.  WHEN IS THIS DIET USED?  Acute phase of Crohn's disease, ulcerative colitis, regional enteritis, or diverticulitis.   Narrowing (stenosis) of intestinal or esophageal tubes (lumina).   Transitional diet following surgery, injury (trauma), or illness.  ADEQUACY This diet is nutritionally adequate based on individual food choices according to the Recommended Dietary Allowances of the National Research Council. CHOOSING FOODS Check labels, especially on foods from the starch list. Often, dietary fiber  content is listed with the Nutrition Facts panel.  Breads and Starches  Allowed: White, French, and pita breads, plain rolls, buns, or sweet rolls, doughnuts, waffles, pancakes, bagels. Plain muffins, sweet breads, biscuits, matzoth. Flour. Soda, saltine, or graham crackers. Pretzels, rusks, melba toast, zwieback. Cooked cereals: cornmeal, farina, cream cereals. Dry cereals: refined corn, wheat, rice, and oat cereals (check label). Potatoes prepared any way without skins, refined macaroni, spaghetti, noodles, refined rice.   Avoid: Bread, rolls, or crackers made with whole-wheat, multigrains, rye, bran seeds, nuts, or coconut. Corn tortillas, table-shells. Corn chips, tortilla chips. Cereals containing whole-grains, multigrains, bran, coconut, nuts, or raisins. Cooked or dry oatmeal. Coarse wheat cereals, granola. Cereals advertised as "high fiber." Potato skins. Whole-grain pasta, wild or brown rice. Popcorn.  Vegetables  Allowed:  Strained tomato and vegetable juices. Fresh: tender lettuce, cucumber, cabbage, spinach, bean sprouts. Cooked, canned: asparagus, bean sprouts, cut green or wax beans, cauliflower, pumpkin, beets, mushrooms, olives, spinach, yellow squash, tomato, tomato sauce (no seeds), zucchini (peeled), turnips. Canned sweet potatoes. Small amounts of celery, onion, radish, and green pepper may be used. Keep servings limited to  cup.   Avoid: Fresh, cooked, or canned: artichokes, baked beans, beet greens, broccoli, Brussels sprouts, French-style green beans, corn, kale, legumes, peas, sweet potatoes. Cooked: green or red cabbage, spinach. Avoid large servings of any vegetables.  Fruit  Allowed:  All fruit juices except prune juice. Cooked or canned: apricots applesauce, cantaloupe, cherries, grapefruit, grapes, kiwi, mandarin oranges, peaches, pears, fruit cocktail, pineapple, plums, watermelon. Fresh: banana, grapes, cantaloupe, avocado, cherries, pineapple, grapefruit, kiwi,  nectarines, peaches, oranges, blueberries, plums. Keep servings limited to  cup or 1 piece.     Avoid: Fresh: apple with or without skin, apricots, mango, pears, raspberries, strawberries. Prune juice, stewed or dried prunes. Dried fruits, raisins, dates. Avoid large servings of all fresh fruits.  Meat and Meat Substitutes  Allowed:  Ground or well-cooked tender beef, ham, veal, lamb, pork, or poultry. Eggs, plain cheese. Fish, oysters, shrimp, lobster, other seafood. Liver, organ meats.   Avoid: Tough, fibrous meats with gristle. Peanut butter, smooth or chunky. Cheese with seeds, nuts, or other foods not allowed. Nuts, seeds, legumes, dried peas, beans, lentils.  Milk  Allowed:  All milk products except those not allowed. Milk and milk product consumption should be minimal when low residue is desired.   Avoid: Yogurt that contains nuts or seeds.  Soups and Combination Foods  Allowed:  Bouillon, broth, or cream soups made from allowed foods. Any strained soup. Casseroles or mixed dishes made with allowed foods.   Avoid: Soups made from vegetables that are not allowed or that contain other foods not allowed.  Desserts and Sweets  Allowed:  Plain cakes and cookies, pie made with allowed fruit, pudding, custard, cream pie. Gelatin, fruit, ice, sherbet, frozen ice pops. Ice cream, ice milk without nuts. Plain hard candy, honey, jelly, molasses, syrup, sugar, chocolate syrup, gumdrops, marshmallows.   Avoid: Desserts, cookies, or candies that contain nuts, peanut butter, or dried fruits. Jams, preserves with seeds, marmalade.  Fats and Oils  Allowed:  Margarine, butter, cream, mayonnaise, salad oils, plain salad dressings made from allowed foods. Plain gravy, crisp bacon without rind.   Avoid: Seeds, nuts, olives. Avocados.  Beverages  Allowed:  All, except those listed to avoid.   Avoid: Fruit juices with high pulp, prune juice.  Condiments  Allowed:  Ketchup, mustard, horseradish,  vinegar, cream sauce, cheese sauce, cocoa powder. Spices in moderation: allspice, basil, bay leaves, celery powder or leaves, cinnamon, cumin powder, curry powder, ginger, mace, marjoram, onion or garlic powder, oregano, paprika, parsley flakes, ground pepper, rosemary, sage, savory, tarragon, thyme, turmeric.   Avoid: Coconut, pickles.  SAMPLE MEAL PLAN The following menu is provided as a sample. Your daily menu plans will vary. Be sure to include a minimum of the following each day in order to provide essential nutrients for the adult:  Starch/Bread/Cereal Group, 6 servings.   Fruit/Vegetable Group, 5 servings.   Meat/Meat Substitute Group, 2 servings.   Milk/Milk Substitute Group, 2 servings.  A serving is equal to  cup for fruits, vegetables, and cooked cereals or 1 piece for foods such as a piece of bread, 1 orange, or 1 apple. For dry cereals and crackers, use serving sizes listed on the label. Combination foods may count as full or partial servings from various food groups. Fats, desserts, and sweets may be added to the meal plan after the requirements for essential nutrients are met. SAMPLE MENU Breakfast   cup orange juice.   1 boiled egg.   1 slice white toast.   Margarine.    cup cornflakes.   1 cup milk.   Beverage.  Lunch   cup chicken noodle soup.   2 to 3 oz sliced roast beef.   2 slices seedless rye bread.   Mayonnaise.    cup tomato juice.   1 small banana.   Beverage.  Dinner  3 oz baked chicken.    cup scalloped potatoes.    cup cooked beets.   White dinner roll.   Margarine.    cup canned peaches.   Beverage.  Document Released: 12/22/2001 Document Revised: 06/21/2011   Document Reviewed: 06/04/2011 ExitCare Patient Information 2012 [employer].  Diverticulitis A diverticulum is a small pouch or sac on the colon. Diverticulosis is the presence of these diverticula on the colon. Diverticulitis is the irritation  (inflammation) or infection of diverticula. CAUSES  The colon and its diverticula contain bacteria. If food particles block the tiny opening to a diverticulum, the bacteria inside can grow and cause an increase in pressure. This leads to infection and inflammation and is called diverticulitis. SYMPTOMS   Abdominal pain and tenderness. Usually, the pain is located on the left side of your abdomen. However, it could be located elsewhere.   Fever.   Bloating.   Feeling sick to your stomach (nausea).   Throwing up (vomiting).   Abnormal stools.  DIAGNOSIS  Your caregiver will take a history and perform a physical exam. Since many things can cause abdominal pain, other tests may be necessary. Tests may include:  Blood tests.   Urine tests.   X-ray of the abdomen.   CT scan of the abdomen.  Sometimes, surgery is needed to determine if diverticulitis or other conditions are causing your symptoms. TREATMENT  Most of the time, you can be treated without surgery. Treatment includes:  Resting the bowels by only having liquids for a few days. As you improve, you will need to eat a low-fiber diet.   Intravenous (IV) fluids if you are losing body fluids (dehydrated).   Antibiotic medicines that treat infections may be given.   Pain and nausea medicine, if needed.   Surgery if the inflamed diverticulum has burst.  HOME CARE INSTRUCTIONS   Try a clear liquid diet (broth, tea, or water for as long as directed by your caregiver). You may then gradually begin a low-fiber diet as tolerated. A low-fiber diet is a diet with less than 10 grams of fiber. Choose the foods below to reduce fiber in the diet:   White breads, cereals, rice, and pasta.   Cooked fruits and vegetables or soft fresh fruits and vegetables without the skin.   Ground or well-cooked tender beef, ham, veal, lamb, pork, or poultry.   Eggs and seafood.   After your diverticulitis symptoms have improved, your caregiver may  put you on a high-fiber diet. A high-fiber diet includes 14 grams of fiber for every 1000 calories consumed. For a standard 2000 calorie diet, you would need 28 grams of fiber. Follow these diet guidelines to help you increase the fiber in your diet. It is important to slowly increase the amount fiber in your diet to avoid gas, constipation, and bloating.   Choose whole-grain breads, cereals, pasta, and brown rice.   Choose fresh fruits and vegetables with the skin on. Do not overcook vegetables because the more vegetables are cooked, the more fiber is lost.   Choose more nuts, seeds, legumes, dried peas, beans, and lentils.   Look for food products that have greater than 3 grams of fiber per serving on the Nutrition Facts label.   Take all medicine as directed by your caregiver.   If your caregiver has given you a follow-up appointment, it is very important that you go. Not going could result in lasting (chronic) or permanent injury, pain, and disability. If there is any problem keeping the appointment, call to reschedule.  SEEK MEDICAL CARE IF:   Your pain does not improve.   You have a hard time advancing your diet beyond clear liquids.   Your bowel movements do not return to normal.    SEEK IMMEDIATE MEDICAL CARE IF:   Your pain becomes worse.   You have an oral temperature above 102 F (38.9 C), not controlled by medicine.   You have repeated vomiting.   You have bloody or black, tarry stools.   Symptoms that brought you to your caregiver become worse or are not getting better.  MAKE SURE YOU:   Understand these instructions.   Will watch your condition.   Will get help right away if you are not doing well or get worse.  Document Released: 04/11/2005 Document Revised: 06/21/2011 Document Reviewed: 08/07/2010 ExitCare Patient Information 2012 [employer]. 

## 2011-11-05 NOTE — Progress Notes (Signed)
Patient had drain removed, per Will Marlyne Beards PA no longer needs [facility] RN. Contacted Darl Pikes with [facility] to notify her the orders were cancelled

## 2011-11-05 NOTE — Discharge Summary (Signed)
OK for discharge on 2 more weeks of antibiotics.  Mikayla Villanueva. Corliss Skains, MD, [facility]  11/05/2011 2:04 PM

## 2011-11-05 NOTE — Progress Notes (Signed)
Drain study pending.  Wilmon Arms. Corliss Skains, MD, [facility]  11/05/2011 2:04 PM

## 2011-11-05 NOTE — Progress Notes (Signed)
Subjective: Pt without new c/o ; CT last pm and drain injection this am Pt has been doing well.  Objective: Vital signs in last 24 hours: Temp:  [97.6 F (36.4 C)-98.3 F (36.8 C)] 97.6 F (36.4 C) (04/22 0606) Pulse Rate:  [70-82] 70  (04/22 0911) Resp:  [18] 18  (04/22 0606) BP: (108-145)/(66-82) 138/76 mmHg (04/22 0911) SpO2:  [97 %-100 %] 100 % (04/22 0606) Last BM Date: 11/04/11  Intake/Output from previous day: 04/21 0701 - 04/22 0700 In: 970 [P.O.:960] Out: 35 [Urine:25; Drains:10] Intake/Output this shift:    LLQ drain intact, output about 10 cc's today  Lab Results:   [facility] 11/05/11 0807 11/03/11 0422  WBC 11.4* 12.4*  HGB 12.6 11.5*  HCT 38.5 35.7*  PLT 689* 702*   BMET  Basename 11/05/11 0807  NA 135  K 3.7  CL 98  CO2 27  GLUCOSE 152*  BUN 16  CREATININE 0.77  CALCIUM 9.0   PT/INR No results found for this basename: LABPROT:2,INR:2 in the last 72 hours ABG No results found for this basename: PHART:2,PCO2:2,PO2:2,HCO3:2 in the last 72 hours  Studies/Results: Ct Abdomen Pelvis W Contrast  11/04/2011  *RADIOLOGY REPORT*  Clinical Data: History of diverticular abscess with drain placement.  Evaluate drain.  CT ABDOMEN AND PELVIS WITH CONTRAST  Technique:  Multidetector CT imaging of the abdomen and pelvis was performed following the standard protocol during bolus administration of intravenous contrast.  Contrast: OMNIPAQUE IOHEXOL 300 MG/ML  SOLN  Comparison: 10/29/2011 and drain placement CT of 10/30/2011  Findings: Mild nonspecific interstitial thickening at the lung bases.  Probable subpleural lymph node at 2 mm on image 3.  Mild cardiomegaly.  Small hiatal hernia with small para esophageal nodes, likely reactive. No pericardial or pleural effusion. Prominent right lobe of the liver.  No focal liver lesion.  Normal spleen, remainder of stomach, pancreas, gallbladder, biliary tract.  Normal adrenal glands and kidneys.  Mild non aneurysmal  dilatation of the infrarenal aorta, 2.3 cm. No retroperitoneal or retrocrural adenopathy.  Extensive colonic diverticulosis.  Most marked in the sigmoid region.  Wall thickening is improved and likely related to muscular hypertrophy.  The percutaneous drain is positioned in the region of the sigmoid.  No dominant residual fluid collection is identified. There is minimal low density with peripheral enhancement laterally, 1.4 cm on image 55.  Possibly residual inflamed diverticulum.  On the prior, this measures 2.1 cm (image 57).  In addition, more anteriorly (along the tract of the catheter), is a extraluminal ill-defined fluid and gas collection which measures 3.6 x 1.8 cm on image 54.  Only mild peripheral enhancement at this site.  Normal terminal ileum and appendix.  Normal small bowel without abdominal ascites.    No pelvic adenopathy.  The bladder is mildly displaced to the right.  No air within.  Suspect mild secondary pericystic edema/inflammation.  Image 56.  Hysterectomy. No significant free fluid.   Moderate osteopenia.  L3 superior endplate deformity is favored to represent moderate compression.  Schmorl's node deformity felt less likely.  There is also a moderate T12 compression deformity.  IMPRESSION: 1.  Interval placement of a percutaneous drain into the previously described diverticular abscess. 2.  Marked improvement in perisigmoid fluid collections.  One anterior ill-defined collection and one tiny nearly resolved lateral collection remain. 3.  Probable secondary cystitis. 4.  Osteopenia with definite T12 and probable L3 compression deformities. Per CMS PQRS reporting requirements (PQRS Measure 24): Given the patient's age of  greater than 50 and the fracture site (hip, distal radius, or spine), the patient should be tested for osteoporosis using DXA, and the appropriate treatment considered based on the DXA results.  Original Report Authenticated By: Consuello Bossier, M.D.   Dg Sinus/fist Tube  Chk-non Gi  11/05/2011  *RADIOLOGY REPORT*  Clinical Data:Status post left pelvic diverticular abscess drainage  ABSCESS INJECTION  Fluoroscopy Time: 0.48 minutes  Comparison: CT abdomen pelvis dated 11/04/2011  Findings: Contrast was injected via indwelling pelvic drain.  Contrast opacifies small pockets of residual fluid adjacent to the indwelling pigtail drain.  No intraluminal contrast is present to suggest fistulous communication.  Contrast refluxed around the catheter tract, external to the patient.  IMPRESSION: No fistulous communication is demonstrated.  Small residual fluid collection/cavity adjacent to the indwelling pigtail drain.  Original Report Authenticated By: Charline Bills, M.D.    Anti-infectives: Anti-infectives     Start     Dose/Rate Route Frequency Ordered Stop   11/05/11 1400   metroNIDAZOLE (FLAGYL) tablet 500 mg        500 mg Oral 3 times per day 11/05/11 1306     11/05/11 1315   ciprofloxacin (CIPRO) tablet 500 mg        500 mg Oral 2 times daily 11/05/11 1306     11/04/11 1200   amoxicillin-clavulanate (AUGMENTIN) 875-125 MG per tablet 1 tablet  Status:  Discontinued        1 tablet Oral Every 12 hours 11/04/11 0735 11/05/11 1306   10/29/11 2000   piperacillin-tazobactam (ZOSYN) IVPB 3.375 g  Status:  Discontinued        3.375 g 12.5 mL/hr over 240 Minutes Intravenous 3 times per day 10/29/11 1635 11/04/11 0735   10/29/11 1800   piperacillin-tazobactam (ZOSYN) IVPB 3.375 g  Status:  Discontinued        3.375 g 12.5 mL/hr over 240 Minutes Intravenous 3 times per day 10/29/11 1620 10/29/11 1635   10/29/11 1230   piperacillin-tazobactam (ZOSYN) IVPB 3.375 g        3.375 g 12.5 mL/hr over 240 Minutes Intravenous  Once 10/29/11 1224 10/29/11 1643   10/29/11 1230   metroNIDAZOLE (FLAGYL) IVPB 500 mg        500 mg 100 mL/hr over 60 Minutes Intravenous  Once 10/29/11 1224 10/29/11 1436          Assessment/Plan: S/p pelvic/diverticular abscess drainage 4/16;   CT looks good, very small residual fluid collection but essentially resolved. No fistula on drain injection. Drain removed after speaking with [facility]. Call if needed.  LOS: 7 days    Brayton El 11/05/2011

## 2011-11-05 NOTE — Discharge Summary (Signed)
Physician Discharge Summary  Patient ID: Mikayla Villanueva MRN: [id number] DOB/AGE: 19-Nov-1920 78 y.o.  Admit date: 10/29/2011 Discharge date: 11/05/2011  Admission Diagnoses: 1. Diverticulitis sigmoid colon with multiloculated fluid collection/abscess. Last colonoscopy 10 years or more.  2. History of hypertension  3. History of hypothyroid on supplement  4. Dyslipidemia- treated  5. history of hand tremors, and lower extremity neuropathy.    Discharge Diagnoses: Same Active Problems:  * No active hospital problems. *    PROCEDURES: IR Percutaneous drainage of abscess  10/30/11 Dr. Fredia Sorrow. IR abscess injection with no fistulous communication, small residual fluid collection,adjacent to indwelling catheter. 11/05/11, Removal of catheter  Hospital Course: The patient is a 78 year old female who first developed pain and was seen on 09/21/2010. She was thought to have a urinary tract infection and placed on Cipro by mouth. She finish the course and continued to have abdominal pain and presented to her primary care doctor Izola Price, restart her second course of Cipro. She got somewhat better, but never had resolution of her lower abdominal and left lower quadrant discomfort. She completed the last course of Cipro on 10/26/2011. She continues to have pain especially with ambulation. She describes as deep and her abdomen. She presented to the emergency room and [facility] today. White count is 26,400. CT scan shows a 5.5 x 9.5 cm multiloculated fluid collection in the left lower quadrant, thickening of the sigmoid colon more multiple sigmoid colon diverticula. No hydronephrosis or hydroureter some osteopenia with degenerative changes of the thoracic spine. This is consistent with sigmoid diverticulitis and abscess. Dr.Gerkin was contact and patient was transferred to [facility] for admission and further medical treatment.  She was admitted and placed on IV antibiotics,  A drain was  placed by IR 4/16.  She was kept NPO, and place on bowel rest with hydration.  Her pain and labs slowly improved, pain ultimately resolved, and her diet was advanced.  She underwent repeat CT and it was IR opinion she was improving, drain was removed and we plan to send home on 2 more weeks of antibiotics by mouth.   Follow up in 2 weeks with DR. Primary care and DR. Gerkin CONDITION ON D/C:  improving  Disposition: 06-Home-Health Care Svc   Medication List  As of 11/05/2011  1:49 PM   TAKE these medications         acetaminophen 500 MG tablet   Commonly known as: TYLENOL   Take 1,000 mg by mouth 2 (two) times daily as needed. For pain      ciprofloxacin 500 MG tablet   Commonly known as: CIPRO   Take 1 tablet (500 mg total) by mouth 2 (two) times daily.      fish oil-omega-3 fatty acids 1000 MG capsule   Take 1 g by mouth 2 (two) times daily.      HYDROcodone-acetaminophen 5-325 MG per tablet   Commonly known as: NORCO   Take 1-2 tablets by mouth every 6 (six) hours as needed.      levothyroxine 88 MCG tablet   Commonly known as: SYNTHROID, LEVOTHROID   Take 88 mcg by mouth daily.      lisinopril 20 MG tablet   Commonly known as: PRINIVIL,ZESTRIL   Take 20 mg by mouth daily.      meloxicam 7.5 MG tablet   Commonly known as: MOBIC   Take 7.5 mg by mouth daily.      metoprolol tartrate 25 MG tablet  Commonly known as: LOPRESSOR   Take 25 mg by mouth daily.      metroNIDAZOLE 500 MG tablet   Commonly known as: FLAGYL   Take 1 tablet (500 mg total) by mouth every 8 (eight) hours.      simvastatin 40 MG tablet   Commonly known as: ZOCOR   Take 40 mg by mouth every evening.           Follow-up Information    Follow up with Joycelyn Rua, MD. Schedule an appointment as soon as possible for a visit in 2 weeks.   Contact information:   [address] [place] [address] [phone]       Follow up with Velora Heckler, MD. Schedule an  appointment as soon as possible for a visit in 2 weeks.   Contact information:   [employer] [address], [address] [place] [address] [phone]          Signed: Sherrie George 11/05/2011, 1:49 PM

## 2011-11-06 ENCOUNTER — Telehealth (INDEPENDENT_AMBULATORY_CARE_PROVIDER_SITE_OTHER): Payer: Self-pay | Admitting: Surgery

## 2011-11-19 ENCOUNTER — Telehealth (INDEPENDENT_AMBULATORY_CARE_PROVIDER_SITE_OTHER): Payer: Self-pay

## 2011-11-19 NOTE — Telephone Encounter (Signed)
Reviewed apt date with Dr Gerrit Friends. Pts daughter advised ok for f/u appt with Dr Gerrit Friends 5-28. Pt to keep appt with PCP 5-7 and call if any concerns.

## 2011-11-29 ENCOUNTER — Other Ambulatory Visit (HOSPITAL_BASED_OUTPATIENT_CLINIC_OR_DEPARTMENT_OTHER): Payer: Self-pay | Admitting: Family Medicine

## 2011-11-29 ENCOUNTER — Ambulatory Visit (HOSPITAL_BASED_OUTPATIENT_CLINIC_OR_DEPARTMENT_OTHER)
Admission: RE | Admit: 2011-11-29 | Discharge: 2011-11-29 | Disposition: A | Discharge status: Home / Self Care | Payer: Medicare Other | Source: Ambulatory Visit | Attending: Family Medicine | Admitting: Family Medicine

## 2011-11-29 ENCOUNTER — Telehealth (INDEPENDENT_AMBULATORY_CARE_PROVIDER_SITE_OTHER): Payer: Self-pay

## 2011-11-29 DIAGNOSIS — N329 Bladder disorder, unspecified: Secondary | ICD-10-CM | POA: Insufficient documentation

## 2011-11-29 DIAGNOSIS — K5732 Diverticulitis of large intestine without perforation or abscess without bleeding: Secondary | ICD-10-CM | POA: Insufficient documentation

## 2011-11-29 MED ORDER — IOHEXOL 300 MG/ML  SOLN
100.0000 mL | Freq: Once | INTRAMUSCULAR | Status: AC | PRN
  Administered 2011-11-29: 100 mL via INTRAVENOUS

## 2011-11-29 NOTE — Telephone Encounter (Signed)
See 3:37pm note. Copy printed for Dr Gerrit Friends to review 12-03-11.

## 2011-11-29 NOTE — Telephone Encounter (Signed)
Per Mikayla Villanueva at Dr [facility] office pt was in today for general f/u after hospitalization. Pt c/o abd soreness and they did CT and labs. WBC 9.7. She states Ct shows fluid collection. I pulled report and labs for Dr Carolynne Edouard in urgent office to review. Per Dr Carolynne Edouard the fluid collection is too small to drain and pt has large amount of stool in colon.Marland Kitchen He recommends pt be started on cipro and flagyl and keep f/u with Dr Mikayla Villanueva. I reviewed this with Mikayla Villanueva. She states pt was started in Cipro and Flagyl today by Dr Theodoro Grist and he will treat pt for her constipation. Mikayla Villanueva advised that if pt does not improve or has other concerns to call our office or if on weekend have pt call MD on call or go to ER.

## 2011-12-03 ENCOUNTER — Emergency Department (HOSPITAL_COMMUNITY): Payer: Medicare Other

## 2011-12-03 ENCOUNTER — Encounter (HOSPITAL_COMMUNITY): Payer: Self-pay | Admitting: *Deleted

## 2011-12-03 ENCOUNTER — Inpatient Hospital Stay (HOSPITAL_COMMUNITY)
Admission: EM | Admit: 2011-12-03 | Discharge: 2011-12-08 | DRG: 372 | Disposition: A | Discharge status: Home Health | Payer: Medicare Other | Attending: Surgery | Admitting: Surgery

## 2011-12-03 DIAGNOSIS — E079 Disorder of thyroid, unspecified: Secondary | ICD-10-CM | POA: Diagnosis present

## 2011-12-03 DIAGNOSIS — E785 Hyperlipidemia, unspecified: Secondary | ICD-10-CM | POA: Diagnosis present

## 2011-12-03 DIAGNOSIS — G5790 Unspecified mononeuropathy of unspecified lower limb: Secondary | ICD-10-CM | POA: Diagnosis present

## 2011-12-03 DIAGNOSIS — N390 Urinary tract infection, site not specified: Secondary | ICD-10-CM | POA: Diagnosis present

## 2011-12-03 DIAGNOSIS — Z79899 Other long term (current) drug therapy: Secondary | ICD-10-CM

## 2011-12-03 DIAGNOSIS — R5381 Other malaise: Secondary | ICD-10-CM | POA: Diagnosis not present

## 2011-12-03 DIAGNOSIS — I10 Essential (primary) hypertension: Secondary | ICD-10-CM | POA: Diagnosis present

## 2011-12-03 DIAGNOSIS — G252 Other specified forms of tremor: Secondary | ICD-10-CM | POA: Diagnosis present

## 2011-12-03 DIAGNOSIS — K630 Abscess of intestine: Principal | ICD-10-CM | POA: Diagnosis present

## 2011-12-03 DIAGNOSIS — K5732 Diverticulitis of large intestine without perforation or abscess without bleeding: Secondary | ICD-10-CM

## 2011-12-03 DIAGNOSIS — G250 Essential tremor: Secondary | ICD-10-CM | POA: Diagnosis present

## 2011-12-03 LAB — COMPREHENSIVE METABOLIC PANEL
ALT: 10 U/L (ref 0–35)
AST: 16 U/L (ref 0–37)
CO2: 29 mEq/L (ref 19–32)
Calcium: 8.5 mg/dL (ref 8.4–10.5)
Chloride: 96 mEq/L (ref 96–112)
GFR calc Af Amer: 89 mL/min — ABNORMAL LOW (ref >90)
GFR calc non Af Amer: 77 mL/min — ABNORMAL LOW (ref >90)
Glucose, Bld: 121 mg/dL — ABNORMAL HIGH (ref 70–99)
Sodium: 134 mEq/L — ABNORMAL LOW (ref 135–145)
Total Bilirubin: 0.4 mg/dL (ref 0.3–1.2)

## 2011-12-03 LAB — CBC
HCT: 40.5 % (ref 36.0–46.0)
MCV: 90.4 fL (ref 78.0–100.0)
Platelets: 355 10*3/uL (ref 150–400)
RBC: 4.48 MIL/uL (ref 3.87–5.11)
WBC: 8.2 10*3/uL (ref 4.0–10.5)

## 2011-12-03 LAB — DIFFERENTIAL
Eosinophils Relative: 4 % (ref 0–5)
Lymphocytes Relative: 11 % — ABNORMAL LOW (ref 12–46)
Lymphs Abs: 0.9 10*3/uL (ref 0.7–4.0)
Monocytes Absolute: 0.7 10*3/uL (ref 0.1–1.0)
Neutro Abs: 6.3 10*3/uL (ref 1.7–7.7)

## 2011-12-03 LAB — URINALYSIS, ROUTINE W REFLEX MICROSCOPIC
Bilirubin Urine: NEGATIVE
Hgb urine dipstick: NEGATIVE
Ketones, ur: NEGATIVE mg/dL
Nitrite: NEGATIVE
Protein, ur: NEGATIVE mg/dL
Specific Gravity, Urine: 1.019 (ref 1.005–1.030)
Urobilinogen, UA: 0.2 mg/dL (ref 0.0–1.0)

## 2011-12-03 MED ORDER — HYDROCODONE-ACETAMINOPHEN 5-325 MG PO TABS
1.0000 | ORAL_TABLET | ORAL | Status: DC | PRN
  Administered 2011-12-05: 1 via ORAL
  Administered 2011-12-06 (×3): 2 via ORAL
  Administered 2011-12-06 – 2011-12-07 (×2): 1 via ORAL
  Administered 2011-12-07: 2 via ORAL
  Filled 2011-12-03 (×2): qty 2
  Filled 2011-12-03 (×2): qty 1
  Filled 2011-12-03: qty 2
  Filled 2011-12-03: qty 1
  Filled 2011-12-03 (×2): qty 2

## 2011-12-03 MED ORDER — FAMOTIDINE IN NACL 20-0.9 MG/50ML-% IV SOLN
20.0000 mg | Freq: Two times a day (BID) | INTRAVENOUS | Status: DC
  Administered 2011-12-03 – 2011-12-08 (×10): 20 mg via INTRAVENOUS
  Filled 2011-12-03 (×13): qty 50

## 2011-12-03 MED ORDER — MORPHINE SULFATE 2 MG/ML IJ SOLN
1.0000 mg | INTRAMUSCULAR | Status: DC | PRN
  Administered 2011-12-03 – 2011-12-04 (×7): 2 mg via INTRAVENOUS
  Filled 2011-12-03 (×7): qty 1

## 2011-12-03 MED ORDER — ACETAMINOPHEN 325 MG PO TABS
650.0000 mg | ORAL_TABLET | Freq: Four times a day (QID) | ORAL | Status: DC | PRN
  Administered 2011-12-05 – 2011-12-06 (×3): 650 mg via ORAL
  Filled 2011-12-03 (×4): qty 2

## 2011-12-03 MED ORDER — SODIUM CHLORIDE 0.9 % IV SOLN
Freq: Once | INTRAVENOUS | Status: AC
  Administered 2011-12-03 (×2): via INTRAVENOUS

## 2011-12-03 MED ORDER — METOPROLOL TARTRATE 25 MG PO TABS
25.0000 mg | ORAL_TABLET | Freq: Every day | ORAL | Status: DC
  Administered 2011-12-03 – 2011-12-08 (×6): 25 mg via ORAL
  Filled 2011-12-03 (×6): qty 1

## 2011-12-03 MED ORDER — METOPROLOL TARTRATE 25 MG PO TABS: 25.0000 mg | ORAL_TABLET | Freq: Every day | ORAL | Status: DC

## 2011-12-03 MED ORDER — POTASSIUM CHLORIDE IN NACL 20-0.9 MEQ/L-% IV SOLN
INTRAVENOUS | Status: DC
  Administered 2011-12-03 – 2011-12-07 (×8): via INTRAVENOUS
  Filled 2011-12-03 (×13): qty 1000

## 2011-12-03 MED ORDER — HYDROMORPHONE HCL PF 1 MG/ML IJ SOLN
1.0000 mg | Freq: Once | INTRAMUSCULAR | Status: AC
  Administered 2011-12-03: 1 mg via INTRAVENOUS
  Filled 2011-12-03: qty 1

## 2011-12-03 MED ORDER — ONDANSETRON HCL 4 MG/2ML IJ SOLN
4.0000 mg | Freq: Once | INTRAMUSCULAR | Status: AC
  Administered 2011-12-03: 4 mg via INTRAVENOUS
  Filled 2011-12-03: qty 2

## 2011-12-03 MED ORDER — IOHEXOL 300 MG/ML  SOLN
100.0000 mL | Freq: Once | INTRAMUSCULAR | Status: AC | PRN
  Administered 2011-12-03: 100 mL via INTRAVENOUS

## 2011-12-03 MED ORDER — MORPHINE SULFATE 2 MG/ML IJ SOLN
INTRAMUSCULAR | Status: AC
  Filled 2011-12-03: qty 1

## 2011-12-03 MED ORDER — LEVOTHYROXINE SODIUM 88 MCG PO TABS
88.0000 ug | ORAL_TABLET | Freq: Every day | ORAL | Status: DC
  Administered 2011-12-03 – 2011-12-08 (×5): 88 ug via ORAL
  Filled 2011-12-03 (×7): qty 1

## 2011-12-03 MED ORDER — ENOXAPARIN SODIUM 40 MG/0.4ML ~~LOC~~ SOLN
40.0000 mg | SUBCUTANEOUS | Status: DC
  Administered 2011-12-03 – 2011-12-07 (×4): 40 mg via SUBCUTANEOUS
  Filled 2011-12-03 (×7): qty 0.4

## 2011-12-03 MED ORDER — ACETAMINOPHEN 650 MG RE SUPP: 650.0000 mg | Freq: Four times a day (QID) | RECTAL | Status: DC | PRN

## 2011-12-03 MED ORDER — SODIUM CHLORIDE 0.9 % IV SOLN
1.0000 g | INTRAVENOUS | Status: DC
  Administered 2011-12-04 – 2011-12-07 (×4): 1 g via INTRAVENOUS
  Filled 2011-12-03 (×7): qty 1

## 2011-12-03 MED ORDER — ONDANSETRON HCL 4 MG/2ML IJ SOLN: 4.0000 mg | Freq: Four times a day (QID) | INTRAMUSCULAR | Status: DC | PRN

## 2011-12-03 NOTE — ED Notes (Signed)
Surgery md at bedside.

## 2011-12-03 NOTE — ED Provider Notes (Signed)
History     CSN: [id number]  Arrival date & time 12/03/11  3086   First MD Initiated Contact with Patient 12/03/11 [phone]      Chief Complaint  Patient presents with  . Abdominal Pain    (Consider location/radiation/quality/duration/timing/severity/associated sxs/prior treatment) Patient is a 78 y.o. female presenting with abdominal pain. The history is provided by the patient (pt complains of abd pain). No language interpreter was used.  Abdominal Pain The primary symptoms of the illness include abdominal pain. The primary symptoms of the illness do not include fatigue or diarrhea. The current episode started 1 to 2 hours ago. The onset of the illness was gradual. The problem has not changed since onset. The illness is associated with recent antibiotic use. The patient states that she believes she is currently not pregnant. The patient has not had a change in bowel habit. Additional symptoms associated with the illness include anorexia. Symptoms associated with the illness do not include heartburn, hematuria, frequency or back pain. Significant associated medical issues do not include PUD.    Past Medical History  Diagnosis Date  . Thyroid disease   . Hypertension   . Hyperlipemia   . Diverticulitis     Past Surgical History  Procedure Date  . Back surgery   . Abdominal hysterectomy   . Ankle surgery   . Eye surgery     History reviewed. No pertinent family history.  History  Substance Use Topics  . Smoking status: Never Smoker   . Smokeless tobacco: Never Used  . Alcohol Use: No    OB History    Grav Para Term Preterm Abortions TAB SAB Ect Mult Living                  Review of Systems  Constitutional: Negative for fatigue.  HENT: Negative for congestion, sinus pressure and ear discharge.   Eyes: Negative for discharge.  Respiratory: Negative for cough.   Cardiovascular: Negative for chest pain.  Gastrointestinal: Positive for abdominal pain and anorexia.  Negative for heartburn and diarrhea.  Genitourinary: Negative for frequency and hematuria.  Musculoskeletal: Negative for back pain.  Skin: Negative for rash.  Neurological: Negative for seizures and headaches.  Hematological: Negative.   Psychiatric/Behavioral: Negative for hallucinations.    Allergies  Sulfa antibiotics and Nitrofurantoin monohyd macro  Home Medications   Current Outpatient Rx  Name Route Sig Dispense Refill  . ACETAMINOPHEN 500 MG PO TABS Oral Take 1,000 mg by mouth 2 (two) times daily as needed. For pain    . CIPROFLOXACIN HCL 500 MG PO TABS Oral Take 500 mg by mouth 2 (two) times daily.    . OMEGA-3 FATTY ACIDS 1000 MG PO CAPS Oral Take 1 g by mouth 2 (two) times daily.     Marland Kitchen LEVOTHYROXINE SODIUM 88 MCG PO TABS Oral Take 88 mcg by mouth daily.    Marland Kitchen LISINOPRIL 20 MG PO TABS Oral Take 20 mg by mouth daily.    . MELOXICAM 7.5 MG PO TABS Oral Take 7.5 mg by mouth daily.    Marland Kitchen METOPROLOL TARTRATE 25 MG PO TABS Oral Take 25 mg by mouth daily.    Marland Kitchen METRONIDAZOLE 500 MG PO TABS Oral Take 500 mg by mouth 3 (three) times daily.    . OXYCODONE-ACETAMINOPHEN 5-325 MG PO TABS Oral Take 1 tablet by mouth every 4 (four) hours as needed. pain    . SIMVASTATIN 40 MG PO TABS Oral Take 40 mg by mouth every evening.  BP 121/53  Pulse 83  Temp(Src) 98.4 F (36.9 C) (Oral)  Resp 18  SpO2 100%  Physical Exam  Constitutional: She is oriented to person, place, and time. She appears well-developed.  HENT:  Head: Normocephalic and atraumatic.  Eyes: Conjunctivae and EOM are normal. No scleral icterus.  Neck: Neck supple. No thyromegaly present.  Cardiovascular: Normal rate and regular rhythm.  Exam reveals no gallop and no friction rub.   No murmur heard. Pulmonary/Chest: No stridor. She has no wheezes. She has no rales. She exhibits no tenderness.  Abdominal: She exhibits no distension. There is tenderness. There is no rebound.  Musculoskeletal: Normal range of motion. She  exhibits no edema.  Lymphadenopathy:    She has no cervical adenopathy.  Neurological: She is oriented to person, place, and time. Coordination normal.  Skin: No rash noted. No erythema.  Psychiatric: She has a normal mood and affect. Her behavior is normal.    ED Course  Procedures (including critical care time)  Labs Reviewed  DIFFERENTIAL - Abnormal; Notable for the following:    Lymphocytes Relative 11 (*)    All other components within normal limits  COMPREHENSIVE METABOLIC PANEL - Abnormal; Notable for the following:    Sodium 134 (*)    Glucose, Bld 121 (*)    BUN 26 (*)    Albumin 3.4 (*)    GFR calc non Af Amer 77 (*)    GFR calc Af Amer 89 (*)    All other components within normal limits  URINALYSIS, ROUTINE W REFLEX MICROSCOPIC - Abnormal; Notable for the following:    APPearance CLOUDY (*)    Leukocytes, UA LARGE (*)    All other components within normal limits  URINE MICROSCOPIC-ADD ON - Abnormal; Notable for the following:    Squamous Epithelial / LPF MANY (*)    Bacteria, UA MANY (*)    All other components within normal limits  CBC  URINE CULTURE   Ct Abdomen Pelvis W Contrast  12/03/2011  *RADIOLOGY REPORT*  Clinical Data: Pain, diverticulitis  CT ABDOMEN AND PELVIS WITH CONTRAST  Technique:  Multidetector CT imaging of the abdomen and pelvis was performed following the standard protocol during bolus administration of intravenous contrast.  Contrast: OMNIPAQUE IOHEXOL 300 MG/ML  SOLN  Comparison: 11/04/2011 and 11/29/2011  Findings: Lung bases are unremarkable.  Sagittal images of the spine shows diffuse osteopenia.  Stable compression deformity is of T12 and L3 vertebral body.  Enhanced liver, spleen, pancreas and adrenals are unremarkable. Atherosclerotic calcifications of the abdominal aorta and the iliac arteries again noted.  No calcified gallstones are noted within gallbladder.  Enhanced kidneys are symmetrical in size.  No hydronephrosis or  hydroureter.  Delayed renal images shows bilateral renal symmetrical excretion. Again noted multiple sigmoid colon diverticula.  Again noted loculated fluid collection with some air fluid level is just posterior to the bladder measures 2.9 x 2.8 cm.  On the prior exam measures 3 x 3 cm.  This is consistent with probable small residual abscess.  No new abscess is noted or pericolonic fluid collection. No  new diverticulitis. Stable small amount of residual fluid in previous abscess cavity adjacent to sigmoid colon measures 1.7 x 1.8 cm.  On the prior exam measures 4.4 x 1.2 cm.  No extraluminal oral contrast material.  The patient is status post hysterectomy.  No destructive bony lesions are noted within pelvis.  IMPRESSION:   1. Again noted multiloculated small collection just posterior to the urinary  bladder measures 2.9 x 2.8 cm.  On the prior exam measures 3 x 3 cm.  This is probable due to a small residual abscess. Stable small amount of residual fluid in previous abscess cavity adjacent to sigmoid colon measures 1.7 x 1.8 cm.  On the prior exam measures 4.4 x 1.2 cm.  2.  No definite evidence of new diverticulitis. 3.  No hydronephrosis or hydroureter. 4.  Status post hysterectomy. 5.  Stable chronic compression deformities of T12 and L3 vertebral body.  Original Report Authenticated By: Natasha Mead, M.D.     No diagnosis found.    MDM  abd pain        Benny Lennert, MD 12/03/11 1410

## 2011-12-03 NOTE — H&P (Signed)
Seen and examined.  Perc drain.agree with PA.

## 2011-12-03 NOTE — H&P (Signed)
Mikayla Villanueva is an 78 y.o. female.   Chief Complaint: Abdominal pain HPI: This is an 78 year old female who was admitted on 10/29/2011 who had a week of abdominal pain she was found to have a urinary tract infection. She was treated with oral Cipro and got a second course was ultimately admitted with a CT scan showing a 5.5 x 9.5 cm multiloculated fluid collection left lower quadrant and a thickened sigmoid colon with multiple sigmoid diverticula. She was placed on IV antibiotics. On 10/30/2011 Dr. Fredia Sorrow placed  a percutaneous drainage of the abscess site which was injected later injected no with  fistulous communication.  The drain was removed and she was sent home on antibiotics.  She did well for a couple days but, had pain again she's been back to her primary care twice, repeat CT shows multiloculated collection posterior to the bladder, no different than it was a with repeat CT scan last week, but ongoing collection.  We plan to admit her and start Invanz, ask IR to drain tomorrow.  She is not keenly interested in surgery if it can be avoided. She has failed on Cipro and flagyl.  Past Medical History  Diagnosis Date  . Thyroid disease   . Hypertension   . Hyperlipemia   . Diverticulitis T12-L3 Compression deformities     Past Surgical History  Procedure Date  . Back surgery   . Abdominal hysterectomy   . Ankle surgery   . Eye surgery     History reviewed. No pertinent family history. Social History:  reports that she has never smoked. She has never used smokeless tobacco. She reports that she does not drink alcohol or use illicit drugs.  Allergies:  Allergies  Allergen Reactions  . Sulfa Antibiotics Hives  . Nitrofurantoin Monohyd Macro Hives   Prior to Admission medications   Medication Sig Start Date End Date Taking? Authorizing Provider  acetaminophen (TYLENOL) 500 MG tablet Take 1,000 mg by mouth 2 (two) times daily as needed. For pain   Yes Historical Provider, MD    ciprofloxacin (CIPRO) 500 MG tablet Take 500 mg by mouth 2 (two) times daily.   Yes Historical Provider, MD  fish oil-omega-3 fatty acids 1000 MG capsule Take 1 g by mouth 2 (two) times daily.    Yes Historical Provider, MD  levothyroxine (SYNTHROID, LEVOTHROID) 88 MCG tablet Take 88 mcg by mouth daily.   Yes Historical Provider, MD  lisinopril (PRINIVIL,ZESTRIL) 20 MG tablet Take 20 mg by mouth daily.   Yes Historical Provider, MD  meloxicam (MOBIC) 7.5 MG tablet Take 7.5 mg by mouth daily.   Yes Historical Provider, MD  metoprolol tartrate (LOPRESSOR) 25 MG tablet Take 25 mg by mouth daily.   Yes Historical Provider, MD  metroNIDAZOLE (FLAGYL) 500 MG tablet Take 500 mg by mouth 3 (three) times daily.   Yes Historical Provider, MD  oxyCODONE-acetaminophen (PERCOCET) 5-325 MG per tablet Take 1 tablet by mouth every 4 (four) hours as needed. pain   Yes Historical Provider, MD  simvastatin (ZOCOR) 40 MG tablet Take 40 mg by mouth every evening.   Yes Historical Provider, MD     (Not in a hospital admission)  Results for orders placed during the hospital encounter of 12/03/11 (from the past 48 hour(s))  URINALYSIS, ROUTINE W REFLEX MICROSCOPIC     Status: Abnormal   Collection Time   12/03/11  9:16 AM      Component Value Range Comment   Color, Urine YELLOW  YELLOW  APPearance CLOUDY (*) CLEAR     Specific Gravity, Urine 1.019  1.005 - 1.030     pH 7.5  5.0 - 8.0     Glucose, UA NEGATIVE  NEGATIVE (mg/dL)    Hgb urine dipstick NEGATIVE  NEGATIVE     Bilirubin Urine NEGATIVE  NEGATIVE     Ketones, ur NEGATIVE  NEGATIVE (mg/dL)    Protein, ur NEGATIVE  NEGATIVE (mg/dL)    Urobilinogen, UA 0.2  0.0 - 1.0 (mg/dL)    Nitrite NEGATIVE  NEGATIVE     Leukocytes, UA LARGE (*) NEGATIVE    URINE MICROSCOPIC-ADD ON     Status: Abnormal   Collection Time   12/03/11  9:16 AM      Component Value Range Comment   Squamous Epithelial / LPF MANY (*) RARE     WBC, UA 21-50  <3 (WBC/hpf)    RBC /  HPF 0-2  <3 (RBC/hpf)    Bacteria, UA MANY (*) RARE    CBC     Status: Normal   Collection Time   12/03/11  9:20 AM      Component Value Range Comment   WBC 8.2  4.0 - 10.5 (K/uL)    RBC 4.48  3.87 - 5.11 (MIL/uL)    Hemoglobin 13.4  12.0 - 15.0 (g/dL)    HCT 40.9  81.1 - 91.4 (%)    MCV 90.4  78.0 - 100.0 (fL)    MCH 29.9  26.0 - 34.0 (pg)    MCHC 33.1  30.0 - 36.0 (g/dL)    RDW 78.2  95.6 - 21.3 (%)    Platelets 355  150 - 400 (K/uL)   DIFFERENTIAL     Status: Abnormal   Collection Time   12/03/11  9:20 AM      Component Value Range Comment   Neutrophils Relative 77  43 - 77 (%)    Neutro Abs 6.3  1.7 - 7.7 (K/uL)    Lymphocytes Relative 11 (*) 12 - 46 (%)    Lymphs Abs 0.9  0.7 - 4.0 (K/uL)    Monocytes Relative 9  3 - 12 (%)    Monocytes Absolute 0.7  0.1 - 1.0 (K/uL)    Eosinophils Relative 4  0 - 5 (%)    Eosinophils Absolute 0.3  0.0 - 0.7 (K/uL)    Basophils Relative 0  0 - 1 (%)    Basophils Absolute 0.0  0.0 - 0.1 (K/uL)   COMPREHENSIVE METABOLIC PANEL     Status: Abnormal   Collection Time   12/03/11  9:20 AM      Component Value Range Comment   Sodium 134 (*) 135 - 145 (mEq/L)    Potassium 3.6  3.5 - 5.1 (mEq/L)    Chloride 96  96 - 112 (mEq/L)    CO2 29  19 - 32 (mEq/L)    Glucose, Bld 121 (*) 70 - 99 (mg/dL)    BUN 26 (*) 6 - 23 (mg/dL)    Creatinine, Ser 0.86  0.50 - 1.10 (mg/dL)    Calcium 8.5  8.4 - 10.5 (mg/dL)    Total Protein 7.2  6.0 - 8.3 (g/dL)    Albumin 3.4 (*) 3.5 - 5.2 (g/dL)    AST 16  0 - 37 (U/L)    ALT 10  0 - 35 (U/L)    Alkaline Phosphatase 87  39 - 117 (U/L)    Total Bilirubin 0.4  0.3 - 1.2 (mg/dL)  GFR calc non Af Amer 77 (*) >90 (mL/min)    GFR calc Af Amer 89 (*) >90 (mL/min)    Ct Abdomen Pelvis W Contrast  12/03/2011  *RADIOLOGY REPORT*  Clinical Data: Pain, diverticulitis  CT ABDOMEN AND PELVIS WITH CONTRAST  Technique:  Multidetector CT imaging of the abdomen and pelvis was performed following the standard protocol during  bolus administration of intravenous contrast.  Contrast: OMNIPAQUE IOHEXOL 300 MG/ML  SOLN  Comparison: 11/04/2011 and 11/29/2011  Findings: Lung bases are unremarkable.  Sagittal images of the spine shows diffuse osteopenia.  Stable compression deformity is of T12 and L3 vertebral body.  Enhanced liver, spleen, pancreas and adrenals are unremarkable. Atherosclerotic calcifications of the abdominal aorta and the iliac arteries again noted.  No calcified gallstones are noted within gallbladder.  Enhanced kidneys are symmetrical in size.  No hydronephrosis or hydroureter.  Delayed renal images shows bilateral renal symmetrical excretion. Again noted multiple sigmoid colon diverticula.  Again noted loculated fluid collection with some air fluid level is just posterior to the bladder measures 2.9 x 2.8 cm.  On the prior exam measures 3 x 3 cm.  This is consistent with probable small residual abscess.  No new abscess is noted or pericolonic fluid collection. No  new diverticulitis. Stable small amount of residual fluid in previous abscess cavity adjacent to sigmoid colon measures 1.7 x 1.8 cm.  On the prior exam measures 4.4 x 1.2 cm.  No extraluminal oral contrast material.  The patient is status post hysterectomy.  No destructive bony lesions are noted within pelvis.  IMPRESSION:   1. Again noted multiloculated small collection just posterior to the urinary bladder measures 2.9 x 2.8 cm.  On the prior exam measures 3 x 3 cm.  This is probable due to a small residual abscess. Stable small amount of residual fluid in previous abscess cavity adjacent to sigmoid colon measures 1.7 x 1.8 cm.  On the prior exam measures 4.4 x 1.2 cm.  2.  No definite evidence of new diverticulitis. 3.  No hydronephrosis or hydroureter. 4.  Status post hysterectomy. 5.  Stable chronic compression deformities of T12 and L3 vertebral body.  Original Report Authenticated By: Natasha Mead, M.D.    Review of Systems  Constitutional:  Positive for malaise/fatigue. Negative for fever, chills and weight loss.  Eyes: Negative.   Respiratory: Negative.   Cardiovascular: Negative.   Gastrointestinal: Positive for abdominal pain. Negative for heartburn, nausea, vomiting, diarrhea and constipation.       Ongoing abdominal pain since shortly after discharge. Last BM 2 days ago  Genitourinary: Negative.   Musculoskeletal: Positive for back pain (says her back hurts her now). Negative for joint pain and falls.       Lower leg neuropathy of uncertain etiology.  Skin: Negative.   Neurological:       Upper extremity tremors  Endo/Heme/Allergies: Negative.   Psychiatric/Behavioral: Negative.     Blood pressure 121/53, pulse 83, temperature 98.4 F (36.9 C), temperature source Oral, resp. rate 18, SpO2 100.00%. Physical Exam  Constitutional: She is oriented to person, place, and time. She appears well-developed and well-nourished. No distress.       Tired and appears chronically ill.  HENT:  Head: Normocephalic and atraumatic.  Nose: Nose normal.  Mouth/Throat: No oropharyngeal exudate.  Eyes: Conjunctivae and EOM are normal. Pupils are equal, round, and reactive to light. Right eye exhibits no discharge. Left eye exhibits no discharge. No scleral icterus.  Neck: Normal range  of motion. Neck supple. No JVD present. No tracheal deviation present. No thyromegaly present.  Cardiovascular: Normal rate, regular rhythm, normal heart sounds and intact distal pulses.  Exam reveals no gallop.   No murmur heard. Respiratory: Effort normal and breath sounds normal. No stridor. No respiratory distress. She has no wheezes. She has no rales. She exhibits no tenderness.  GI: Soft. Bowel sounds are normal. She exhibits no distension and no mass. There is tenderness (She complains of tenderness LLQ but on exam, she is not especially tender,.  She has had pain meds a couple times, and now complains of pain in her back.). There is no rebound and no  guarding.  Musculoskeletal: Normal range of motion. She exhibits no edema.  Lymphadenopathy:    She has no cervical adenopathy.  Neurological: She is alert and oriented to person, place, and time. She has normal reflexes. No cranial nerve deficit. Coordination normal.  Skin: Skin is warm and dry. No rash noted. She is not diaphoretic. No erythema. No pallor.  Psychiatric: She has a normal mood and affect. Her behavior is normal. Judgment and thought content normal.     Assessment/Plan 1. Multiloculated diverticular abscess, with ongoing abdominal discomfort and pain. 2. Thyroid disease on supple 3. Hypertension 4. Hyperlipidemia 5. History of diverticulitis 20 years ago. 6. 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  Plan: Hydrate, pain relief, Invanz, ask IR to dry draining again. Further workup and evaluation as needed.  This will [place] PA for Dr. Harriette Bouillon.  Malarie Tappen 12/03/2011, 3:33 PM

## 2011-12-03 NOTE — ED Notes (Signed)
Report to Christina 

## 2011-12-03 NOTE — ED Notes (Signed)
FAO:[id number]<MV> Expected date:12/03/11<BR> Expected time: 8:08 AM<BR> Means of arrival:Ambulance<BR> Comments:<BR> 89yoF,L abd pain

## 2011-12-03 NOTE — ED Notes (Signed)
Per ems: pt has diverticulitis left abdominal radiating to her back and left shoulder. Pt states she is not able to due to pain.per pt's daughter she has nausea on Friday but not v/d. Pt had normal bm on Friday. Pt was seen at [facility] and was place on abx and pain medication

## 2011-12-04 ENCOUNTER — Inpatient Hospital Stay (HOSPITAL_COMMUNITY): Payer: Medicare Other

## 2011-12-04 LAB — PROTIME-INR
INR: 1.14 (ref 0.00–1.49)
Prothrombin Time: 14.8 seconds (ref 11.6–15.2)

## 2011-12-04 LAB — BASIC METABOLIC PANEL
BUN: 15 mg/dL (ref 6–23)
GFR calc Af Amer: >90 mL/min (ref >90)
GFR calc non Af Amer: 78 mL/min — ABNORMAL LOW (ref >90)
Potassium: 3.7 mEq/L (ref 3.5–5.1)
Sodium: 134 mEq/L — ABNORMAL LOW (ref 135–145)

## 2011-12-04 MED ORDER — FENTANYL CITRATE 0.05 MG/ML IJ SOLN
INTRAMUSCULAR | Status: AC | PRN
  Administered 2011-12-04: 100 ug via INTRAVENOUS

## 2011-12-04 MED ORDER — DIPHENHYDRAMINE HCL 25 MG PO CAPS: 25.0000 mg | ORAL_CAPSULE | Freq: Every evening | ORAL | Status: DC | PRN

## 2011-12-04 MED ORDER — DIPHENHYDRAMINE HCL 50 MG/ML IJ SOLN
12.5000 mg | Freq: Every evening | INTRAMUSCULAR | Status: DC | PRN
  Administered 2011-12-05 (×2): 12.5 mg via INTRAMUSCULAR
  Filled 2011-12-04 (×2): qty 1

## 2011-12-04 MED ORDER — MIDAZOLAM HCL 5 MG/5ML IJ SOLN
INTRAMUSCULAR | Status: AC | PRN
Start: 2011-12-04 — End: 2011-12-04
  Administered 2011-12-04 (×2): 1 mg via INTRAVENOUS

## 2011-12-04 MED ORDER — DIPHENHYDRAMINE HCL 25 MG PO CAPS
25.0000 mg | ORAL_CAPSULE | Freq: Every evening | ORAL | Status: DC | PRN
  Administered 2011-12-07: 25 mg via ORAL
  Filled 2011-12-04 (×2): qty 1

## 2011-12-04 NOTE — Progress Notes (Signed)
UR complete 

## 2011-12-04 NOTE — Procedures (Signed)
Successful placement of a 10 French drain into the abscess/fluid collection within the left lower abdominal quadrant Approximately 4 mL of bloody, purulent fluid was aspirated. Samples sent to lab as requested. No immediate post procedural complications.

## 2011-12-04 NOTE — Interval H&P Note (Cosign Needed)
History and Physical Interval Note:  12/04/2011 9:57 AM  Mikayla Villanueva  Is scheduled for CT guided aspiration/possible drainage of recurrent diverticular abscess today. The various methods of treatment have been discussed with the patient and family. After consideration of risks, benefits and other options for treatment, the patient has consented to the above procedure.  The patients' history has been reviewed, imaging studies reviewed by Dr. Grace Isaac, patient examined, no change in status, stable for the above procedure. Chest- few bibasilar crackles, heart- RRR with 2/6 SEM; abd.- soft, positive BS, NT/ND.  I have reviewed the patients' chart and labs.  Questions were answered to the patient's satisfaction.   Past Medical History  Diagnosis Date  . Thyroid disease   . Hypertension   . Hyperlipemia   . Diverticulitis    Past Surgical History  Procedure Date  . Back surgery   . Abdominal hysterectomy   . Ankle surgery   . Eye surgery    Ct Abdomen Pelvis W Contrast  12/03/2011  *RADIOLOGY REPORT*  Clinical Data: Pain, diverticulitis  CT ABDOMEN AND PELVIS WITH CONTRAST  Technique:  Multidetector CT imaging of the abdomen and pelvis was performed following the standard protocol during bolus administration of intravenous contrast.  Contrast: OMNIPAQUE IOHEXOL 300 MG/ML  SOLN  Comparison: 11/04/2011 and 11/29/2011  Findings: Lung bases are unremarkable.  Sagittal images of the spine shows diffuse osteopenia.  Stable compression deformity is of T12 and L3 vertebral body.  Enhanced liver, spleen, pancreas and adrenals are unremarkable. Atherosclerotic calcifications of the abdominal aorta and the iliac arteries again noted.  No calcified gallstones are noted within gallbladder.  Enhanced kidneys are symmetrical in size.  No hydronephrosis or hydroureter.  Delayed renal images shows bilateral renal symmetrical excretion. Again noted multiple sigmoid colon diverticula.  Again noted loculated fluid  collection with some air fluid level is just posterior to the bladder measures 2.9 x 2.8 cm.  On the prior exam measures 3 x 3 cm.  This is consistent with probable small residual abscess.  No new abscess is noted or pericolonic fluid collection. No  new diverticulitis. Stable small amount of residual fluid in previous abscess cavity adjacent to sigmoid colon measures 1.7 x 1.8 cm.  On the prior exam measures 4.4 x 1.2 cm.  No extraluminal oral contrast material.  The patient is status post hysterectomy.  No destructive bony lesions are noted within pelvis.  IMPRESSION:   1. Again noted multiloculated small collection just posterior to the urinary bladder measures 2.9 x 2.8 cm.  On the prior exam measures 3 x 3 cm.  This is probable due to a small residual abscess. Stable small amount of residual fluid in previous abscess cavity adjacent to sigmoid colon measures 1.7 x 1.8 cm.  On the prior exam measures 4.4 x 1.2 cm.  2.  No definite evidence of new diverticulitis. 3.  No hydronephrosis or hydroureter. 4.  Status post hysterectomy. 5.  Stable chronic compression deformities of T12 and L3 vertebral body.  Original Report Authenticated By: Natasha Mead, M.D.   Ct Abdomen Pelvis W Contrast  11/29/2011  **ADDENDUM** CREATED: 11/29/2011 14:41:13  Last sentence of the impression has an incorrect word. Descend should be corrected to state:  New multiloculated collection posterior to the bladder at the left superior margin of the vaginal CUFF (not cough), question new small multiloculated abscess.  **END ADDENDUM** SIGNED BY: Loraine Leriche A. Tyron Russell, M.D.   11/29/2011  *RADIOLOGY REPORT*  Clinical Data: Right side abdominal  pain, abscess drained 2 weeks ago, history of diverticulitis, hypertension, hysterectomy  CT ABDOMEN AND PELVIS WITH CONTRAST  Technique:  Multidetector CT imaging of the abdomen and pelvis was performed following the standard protocol during bolus administration of intravenous contrast. Sagittal and coronal MPR  images reconstructed from axial data set.  Contrast: OMNIPAQUE IOHEXOL 300 MG/ML  SOLN Dilute oral contrast.  Comparison: 11/04/2011  Findings: Minimal chronic subpleural changes at the lung bases. Liver, spleen, pancreas, kidneys, adrenal glands normal appearance. Diverticulosis of distal descending and sigmoid colon with significantly improved changes of diverticulitis is seen on the previous exam. Less pericolic inflammatory changes are seen. Previously placed percutaneous drainage catheter is then removed. Small amount residual fluid at the abscess cavity, 4.4 x 1.2 cm in size.  Newly identified multiloculated collection posterior to bladder at the superior margin of the vaginal cuff post hysterectomy, question multiloculated abscess, 3.0 x 3.0 cm. Foci of gas within urinary bladder may be related to prior catheterization. No definite mass, adenopathy, or free fluid. Normal appendix. Superior endplate compression deformities of the T12 and L3 vertebral bodies unchanged.  IMPRESSION: Improved inflammatory changes of acute diverticulitis in left lower quadrant/pelvis. Small residual fluid collection at site of previously identified percutaneous drainage catheter, 4.4 x 1.2 cm. New multiloculated collection posterior to the bladder at the left superior margin of the vaginal cough, question new small multiloculated abscess.  Original Report Authenticated By: Lollie Marrow, M.D.   Ct Abdomen Pelvis W Contrast  11/04/2011  *RADIOLOGY REPORT*  Clinical Data: History of diverticular abscess with drain placement.  Evaluate drain.  CT ABDOMEN AND PELVIS WITH CONTRAST  Technique:  Multidetector CT imaging of the abdomen and pelvis was performed following the standard protocol during bolus administration of intravenous contrast.  Contrast: OMNIPAQUE IOHEXOL 300 MG/ML  SOLN  Comparison: 10/29/2011 and drain placement CT of 10/30/2011  Findings: Mild nonspecific interstitial thickening at the lung bases.   Probable subpleural lymph node at 2 mm on image 3.  Mild cardiomegaly.  Small hiatal hernia with small para esophageal nodes, likely reactive. No pericardial or pleural effusion. Prominent right lobe of the liver.  No focal liver lesion.  Normal spleen, remainder of stomach, pancreas, gallbladder, biliary tract.  Normal adrenal glands and kidneys.  Mild non aneurysmal dilatation of the infrarenal aorta, 2.3 cm. No retroperitoneal or retrocrural adenopathy.  Extensive colonic diverticulosis.  Most marked in the sigmoid region.  Wall thickening is improved and likely related to muscular hypertrophy.  The percutaneous drain is positioned in the region of the sigmoid.  No dominant residual fluid collection is identified. There is minimal low density with peripheral enhancement laterally, 1.4 cm on image 55.  Possibly residual inflamed diverticulum.  On the prior, this measures 2.1 cm (image 57).  In addition, more anteriorly (along the tract of the catheter), is a extraluminal ill-defined fluid and gas collection which measures 3.6 x 1.8 cm on image 54.  Only mild peripheral enhancement at this site.  Normal terminal ileum and appendix.  Normal small bowel without abdominal ascites.    No pelvic adenopathy.  The bladder is mildly displaced to the right.  No air within.  Suspect mild secondary pericystic edema/inflammation.  Image 56.  Hysterectomy. No significant free fluid.   Moderate osteopenia.  L3 superior endplate deformity is favored to represent moderate compression.  Schmorl's node deformity felt less likely.  There is also a moderate T12 compression deformity.  IMPRESSION: 1.  Interval placement of a percutaneous drain  into the previously described diverticular abscess. 2.  Marked improvement in perisigmoid fluid collections.  One anterior ill-defined collection and one tiny nearly resolved lateral collection remain. 3.  Probable secondary cystitis. 4.  Osteopenia with definite T12 and probable L3 compression  deformities. Per CMS PQRS reporting requirements (PQRS Measure 24): Given the patient's age of greater than 50 and the fracture site (hip, distal radius, or spine), the patient should be tested for osteoporosis using DXA, and the appropriate treatment considered based on the DXA results.  Original Report Authenticated By: Consuello Bossier, M.D.   Dg Sinus/fist Tube Chk-non Gi  11/05/2011  *RADIOLOGY REPORT*  Clinical Data:Status post left pelvic diverticular abscess drainage  ABSCESS INJECTION  Fluoroscopy Time: 0.48 minutes  Comparison: CT abdomen pelvis dated 11/04/2011  Findings: Contrast was injected via indwelling pelvic drain.  Contrast opacifies small pockets of residual fluid adjacent to the indwelling pigtail drain.  No intraluminal contrast is present to suggest fistulous communication.  Contrast refluxed around the catheter tract, external to the patient.  IMPRESSION: No fistulous communication is demonstrated.  Small residual fluid collection/cavity adjacent to the indwelling pigtail drain.  Original Report Authenticated By: Charline Bills, M.D.  Results for orders placed during the hospital encounter of 12/03/11  CBC      Component Value Range   WBC 8.2  4.0 - 10.5 (K/uL)   RBC 4.48  3.87 - 5.11 (MIL/uL)   Hemoglobin 13.4  12.0 - 15.0 (g/dL)   HCT 62.1  30.8 - 65.7 (%)   MCV 90.4  78.0 - 100.0 (fL)   MCH 29.9  26.0 - 34.0 (pg)   MCHC 33.1  30.0 - 36.0 (g/dL)   RDW 84.6  96.2 - 95.2 (%)   Platelets 355  150 - 400 (K/uL)  DIFFERENTIAL      Component Value Range   Neutrophils Relative 77  43 - 77 (%)   Neutro Abs 6.3  1.7 - 7.7 (K/uL)   Lymphocytes Relative 11 (*) 12 - 46 (%)   Lymphs Abs 0.9  0.7 - 4.0 (K/uL)   Monocytes Relative 9  3 - 12 (%)   Monocytes Absolute 0.7  0.1 - 1.0 (K/uL)   Eosinophils Relative 4  0 - 5 (%)   Eosinophils Absolute 0.3  0.0 - 0.7 (K/uL)   Basophils Relative 0  0 - 1 (%)   Basophils Absolute 0.0  0.0 - 0.1 (K/uL)  COMPREHENSIVE METABOLIC PANEL       Component Value Range   Sodium 134 (*) 135 - 145 (mEq/L)   Potassium 3.6  3.5 - 5.1 (mEq/L)   Chloride 96  96 - 112 (mEq/L)   CO2 29  19 - 32 (mEq/L)   Glucose, Bld 121 (*) 70 - 99 (mg/dL)   BUN 26 (*) 6 - 23 (mg/dL)   Creatinine, Ser 8.41  0.50 - 1.10 (mg/dL)   Calcium 8.5  8.4 - 32.4 (mg/dL)   Total Protein 7.2  6.0 - 8.3 (g/dL)   Albumin 3.4 (*) 3.5 - 5.2 (g/dL)   AST 16  0 - 37 (U/L)   ALT 10  0 - 35 (U/L)   Alkaline Phosphatase 87  39 - 117 (U/L)   Total Bilirubin 0.4  0.3 - 1.2 (mg/dL)   GFR calc non Af Amer 77 (*) >90 (mL/min)   GFR calc Af Amer 89 (*) >90 (mL/min)  URINALYSIS, ROUTINE W REFLEX MICROSCOPIC      Component Value Range   Color, Urine YELLOW  YELLOW    APPearance CLOUDY (*) CLEAR    Specific Gravity, Urine 1.019  1.005 - 1.030    pH 7.5  5.0 - 8.0    Glucose, UA NEGATIVE  NEGATIVE (mg/dL)   Hgb urine dipstick NEGATIVE  NEGATIVE    Bilirubin Urine NEGATIVE  NEGATIVE    Ketones, ur NEGATIVE  NEGATIVE (mg/dL)   Protein, ur NEGATIVE  NEGATIVE (mg/dL)   Urobilinogen, UA 0.2  0.0 - 1.0 (mg/dL)   Nitrite NEGATIVE  NEGATIVE    Leukocytes, UA LARGE (*) NEGATIVE   URINE MICROSCOPIC-ADD ON      Component Value Range   Squamous Epithelial / LPF MANY (*) RARE    WBC, UA 21-50  <3 (WBC/hpf)   RBC / HPF 0-2  <3 (RBC/hpf)   Bacteria, UA MANY (*) RARE   APTT      Component Value Range   aPTT 35  24 - 37 (seconds)  BASIC METABOLIC PANEL      Component Value Range   Sodium 134 (*) 135 - 145 (mEq/L)   Potassium 3.7  3.5 - 5.1 (mEq/L)   Chloride 99  96 - 112 (mEq/L)   CO2 26  19 - 32 (mEq/L)   Glucose, Bld 83  70 - 99 (mg/dL)   BUN 15  6 - 23 (mg/dL)   Creatinine, Ser 1.61  0.50 - 1.10 (mg/dL)   Calcium 8.4  8.4 - 09.6 (mg/dL)   GFR calc non Af Amer 78 (*) >90 (mL/min)   GFR calc Af Amer >90  >90 (mL/min)  MAGNESIUM      Component Value Range   Magnesium 2.0  1.5 - 2.5 (mg/dL)  PROTIME-INR      Component Value Range   Prothrombin Time 14.8  11.6 - 15.2  (seconds)   INR 1.14  0.00 - 1.49      Mikayla Villanueva,D KEVIN

## 2011-12-04 NOTE — Progress Notes (Signed)
Agree. IR to see.  Cont abx

## 2011-12-04 NOTE — Progress Notes (Signed)
Perc drain.  Clinically stable and does not want surgery.

## 2011-12-04 NOTE — Progress Notes (Addendum)
Subjective: Feels better blames it on the morphine, awaiting IR  Objective: Vital signs in last 24 hours: Temp:  [97 F (36.1 C)-98.1 F (36.7 C)] 97 F (36.1 C) (05/21 0625) Pulse Rate:  [70-84] 74  (05/21 0625) Resp:  [16-18] 16  (05/21 0625) BP: (117-148)/(47-74) 129/47 mmHg (05/21 0625) SpO2:  [70 %-100 %] 98 % (05/21 0625) Weight:  [68.493 kg (151 lb)] 68.493 kg (151 lb) (05/20 1810) Last BM Date: 12/01/11  2 stools this AM, afebrile, VSS, labs ok,  Intake/Output from previous day: 05/20 0701 - 05/21 0700 In: 1250 [I.V.:1200; IV Piggyback:50] Out: 1300 [Urine:1300] Intake/Output this shift: Total I/O In: 100 [P.O.:100] Out: 200 [Urine:200]  General appearance: alert, cooperative and no distress ABD;  Soft, not really tender right now either. Lab Results:   [facility] 12/03/11 0920  WBC 8.2  HGB 13.4  HCT 40.5  PLT 355    BMET  Basename 12/04/11 0420 12/03/11 0920  NA 134* 134*  K 3.7 3.6  CL 99 96  CO2 26 29  GLUCOSE 83 121*  BUN 15 26*  CREATININE 0.62 0.66  CALCIUM 8.4 8.5   PT/INR  Basename 12/04/11 0420  LABPROT 14.8  INR 1.14     Lab 12/03/11 0920  AST 16  ALT 10  ALKPHOS 87  BILITOT 0.4  PROT 7.2  ALBUMIN 3.4*     Lipase  No results found for this basename: lipase     Studies/Results: Ct Abdomen Pelvis W Contrast  12/03/2011  *RADIOLOGY REPORT*  Clinical Data: Pain, diverticulitis  CT ABDOMEN AND PELVIS WITH CONTRAST  Technique:  Multidetector CT imaging of the abdomen and pelvis was performed following the standard protocol during bolus administration of intravenous contrast.  Contrast: OMNIPAQUE IOHEXOL 300 MG/ML  SOLN  Comparison: 11/04/2011 and 11/29/2011  Findings: Lung bases are unremarkable.  Sagittal images of the spine shows diffuse osteopenia.  Stable compression deformity is of T12 and L3 vertebral body.  Enhanced liver, spleen, pancreas and adrenals are unremarkable. Atherosclerotic calcifications of the abdominal  aorta and the iliac arteries again noted.  No calcified gallstones are noted within gallbladder.  Enhanced kidneys are symmetrical in size.  No hydronephrosis or hydroureter.  Delayed renal images shows bilateral renal symmetrical excretion. Again noted multiple sigmoid colon diverticula.  Again noted loculated fluid collection with some air fluid level is just posterior to the bladder measures 2.9 x 2.8 cm.  On the prior exam measures 3 x 3 cm.  This is consistent with probable small residual abscess.  No new abscess is noted or pericolonic fluid collection. No  new diverticulitis. Stable small amount of residual fluid in previous abscess cavity adjacent to sigmoid colon measures 1.7 x 1.8 cm.  On the prior exam measures 4.4 x 1.2 cm.  No extraluminal oral contrast material.  The patient is status post hysterectomy.  No destructive bony lesions are noted within pelvis.  IMPRESSION:   1. Again noted multiloculated small collection just posterior to the urinary bladder measures 2.9 x 2.8 cm.  On the prior exam measures 3 x 3 cm.  This is probable due to a small residual abscess. Stable small amount of residual fluid in previous abscess cavity adjacent to sigmoid colon measures 1.7 x 1.8 cm.  On the prior exam measures 4.4 x 1.2 cm.  2.  No definite evidence of new diverticulitis. 3.  No hydronephrosis or hydroureter. 4.  Status post hysterectomy. 5.  Stable chronic compression deformities of T12 and L3 vertebral  body.  Original Report Authenticated By: Natasha Mead, M.D.    Medications:    . sodium chloride   Intravenous Once  . enoxaparin  40 mg Subcutaneous Q24H  . ertapenem (INVANZ) IV  1 g Intravenous Q24H  . famotidine (PEPCID) IV  20 mg Intravenous Q12H  . levothyroxine  88 mcg Oral Daily  . metoprolol tartrate  25 mg Oral Daily  . morphine      . DISCONTD: metoprolol tartrate  25 mg Oral Daily    Assessment/Plan 1. Multiloculated diverticular abscess, with ongoing abdominal discomfort and pain.    2. Thyroid disease on supple  3. Hypertension  4. Hyperlipidemia  5. History of diverticulitis 20 years ago.  6. 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 7. UTI culture pending Plan:  IR intervention, antibiotics      LOS: 1 day    Jahnia Hewes 12/04/2011

## 2011-12-05 LAB — COMPREHENSIVE METABOLIC PANEL
ALT: 9 U/L (ref 0–35)
AST: 16 U/L (ref 0–37)
Alkaline Phosphatase: 79 U/L (ref 39–117)
CO2: 25 mEq/L (ref 19–32)
Chloride: 94 mEq/L — ABNORMAL LOW (ref 96–112)
Creatinine, Ser: 0.61 mg/dL (ref 0.50–1.10)
GFR calc non Af Amer: 79 mL/min — ABNORMAL LOW (ref >90)
Potassium: 4 mEq/L (ref 3.5–5.1)
Sodium: 132 mEq/L — ABNORMAL LOW (ref 135–145)
Total Bilirubin: 0.5 mg/dL (ref 0.3–1.2)

## 2011-12-05 LAB — URINE CULTURE
Colony Count: 50000
Culture  Setup Time: 201305201503

## 2011-12-05 LAB — CBC
MCV: 89.3 fL (ref 78.0–100.0)
Platelets: 350 10*3/uL (ref 150–400)
RBC: 4.38 MIL/uL (ref 3.87–5.11)
WBC: 11.7 10*3/uL — ABNORMAL HIGH (ref 4.0–10.5)

## 2011-12-05 MED ORDER — DIPHENHYDRAMINE HCL 50 MG/ML IJ SOLN: 12.5000 mg | Freq: Once | INTRAMUSCULAR | Status: DC

## 2011-12-05 NOTE — Progress Notes (Signed)
Pt acting confused and disoriented, thinks she is at home, wants to get out of bed and get on her couch.   Daughter tried unsuccessfully to reorient pt and keep her from getting out of bed by herself.  Pt refused to take PO Benadryl or SQ Benadryl to help her relax and sleep.  She became combative, swinging her fist at [place] when we tried to pull her up in bed.  She continued to try to crawl out of bed, so I gave her 12.5 mg Benadryl IV.  Will continue to monitor.

## 2011-12-05 NOTE — Progress Notes (Signed)
Pt woke up from sleep confused, said she was going to her kitchen to make coffee.  Tried to climb out of bed on he own.  Became combative when we tried to help her to bathroom and didn't want to be touched. Pulled her IV out, and was pulling at her drain.  Daughter became upset, said she has never seen her mother like this before. I tried to reassure her that it was most likely the morphine she had taken yesterday and pt would eventually be her old self again.  Notified MD on call, obtained one time order for Benadryl.  Will continue to monitor.

## 2011-12-05 NOTE — Progress Notes (Signed)
Subjective: Very confused last PM, didn't sleep, thinks she's in [place].  Only complaint is her back.  Objective: Vital signs in last 24 hours: Temp:  [97.7 F (36.5 C)-97.9 F (36.6 C)] 97.9 F (36.6 C) (05/22 0651) Pulse Rate:  [78-127] 103  (05/22 0651) Resp:  [14-23] 20  (05/22 0651) BP: (109-166)/(58-104) 159/80 mmHg (05/22 0651) SpO2:  [15 %-100 %] 98 % (05/22 0651) Last BM Date: 12/01/11  2 stools recorded for last PM, Afebrile, VSS, BP up some last night, NA down some, WBC is up a little  Intake/Output from previous day: 05/21 0701 - 05/22 0700 In: 2958.3 [P.O.:460; I.V.:2498.3] Out: 2001 [Urine:2000; Stool:1] Intake/Output this shift:    General appearance: alert and confused, just woke up from a napl Resp: clear to auscultation bilaterally GI: soft, non-tender; bowel sounds normal; no masses,  no organomegaly and drain is redish clear in color.  Lab Results:   Basename 12/05/11 0435 12/03/11 0920  WBC 11.7* 8.2  HGB 12.8 13.4  HCT 39.1 40.5  PLT 350 355    BMET  Basename 12/05/11 0435 12/04/11 0420  NA 132* 134*  K 4.0 3.7  CL 94* 99  CO2 25 26  GLUCOSE 101* 83  BUN 11 15  CREATININE 0.61 0.62  CALCIUM 8.8 8.4   PT/INR  Basename 12/04/11 0420  LABPROT 14.8  INR 1.14     Lab 12/05/11 0435 12/03/11 0920  AST 16 16  ALT 9 10  ALKPHOS 79 87  BILITOT 0.5 0.4  PROT 6.7 7.2  ALBUMIN 3.1* 3.4*     Lipase  No results found for this basename: lipase     Studies/Results: Ct Guided Abscess Drain  12/04/2011  *RADIOLOGY REPORT*  Indication: Recurrent/symptomatic left lower quadrant diverticular abscess  CT GUIDED LEFT LOWER QUADRANT DIVERTICULAR ABSCESS DRAINAGE CATHETER PLACEMENT  Comparison: Abdominal CT - 12/03/2011; 11/29/2011; 11/04/2011; 10/29/2011; CT guided drainage catheter placement - 10/30/2011  Medications: Fentanyl 100 mcg IV; Versed 2 mg IV  Total Moderate Sedation time: 50 minutes  Contrast: None  Complications: None  immediate  Technique / Findings:  Informed written consent was obtained from the patient after a discussion of the risks, benefits and alternatives to treatment. The patient was placed supine on the CT gantry and a pre procedural CT was performed re-demonstrating the known abscess/fluid collection within the left lower abdominal quadrant.  The procedure was planned.   A timeout was performed prior to the initiation of the procedure.  The left lower abdomen was prepped and draped in the usual sterile fashion.   The overlying soft tissues were anesthetized with 1% lidocaine with epinephrine. Utilizing a 22 gauge spinal needle, appropriate trajectory was planned with special attention paid to avoid the low-lying small bowel.  An 18 gauge trocar needle was advanced in to the abscess/fluid collection and a short Amplatz super stiff wire was coiled within the abscess/fluid collection. Appropriate positioning was confirmed with a limited CT scan.  To ensure the small bowel had not been transgressed, reformatted sagittal images were performed and reviewed at the CT workstation console.  The tract was serially dilated allowing placement of a 10 [place] all-purpose drainage catheter.  Appropriate positioning was confirmed with a limited postprocedural CT scan.  5 ml of purulent, slightly bloody fluid was aspirated.  The tube was connected to a drainage bag and sutured in place.  A dressing was placed.  The patient tolerated the procedure well without immediate post procedural complication.  Impression:  Successful CT guided  placement of a 10 [place] all purpose drain catheter into the left lower abdominal quadrant with aspiration of 5 mL of purulent, bloody fluid.  Samples were sent to the laboratory as requested by the ordering clinical team.  Original Report Authenticated By: Waynard Reeds, M.D.    Medications:    . diphenhydrAMINE  12.5 mg Intramuscular Once  . enoxaparin  40 mg Subcutaneous Q24H  . ertapenem  (INVANZ) IV  1 g Intravenous Q24H  . famotidine (PEPCID) IV  20 mg Intravenous Q12H  . levothyroxine  88 mcg Oral Daily  . metoprolol tartrate  25 mg Oral Daily    Assessment/Plan 1. Multiloculated diverticular abscess, with ongoing abdominal discomfort and pain.  2. Thyroid disease on supple  3. Hypertension  4. Hyperlipidemia  5. History of diverticulitis 20 years ago.  6. 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  7. UTI culture pending    Plan: I told family to keep her awake as much as possible today, walk her some try to get her reoriented.  I will get a K pad for her back.  Continue antibiotics, and fluids, she is on clear liquids.      LOS: 2 days    Hazem Kenner 12/05/2011

## 2011-12-05 NOTE — Progress Notes (Signed)
Hopefully she will get better.  If not will need to consider exploratory laparotomy.

## 2011-12-06 NOTE — Progress Notes (Signed)
Subjective: Pt feeling better this am; no sig abd pain ,nausea or vomiting; tolerating liquids ok  Objective: Vital signs in last 24 hours: Temp:  [97.5 F (36.4 C)-98.3 F (36.8 C)] 97.6 F (36.4 C) (05/23 0630) Pulse Rate:  [71-79] 71  (05/23 0630) Resp:  [19-20] 20  (05/23 0630) BP: (131-145)/(72-79) 131/74 mmHg (05/23 0630) SpO2:  [95 %-96 %] 95 % (05/23 0630) Last BM Date: 12/06/11  Intake/Output from previous day: 05/22 0701 - 05/23 0700 In: 1120 [P.O.:320; I.V.:800] Out: 735 [Urine:725; Drains:10] Intake/Output this shift:    LLQ drain intact, insertion site ok , NT; output about 10-15 cc's today cream colored fluid; cx's pend; drain flushed with 10 cc's sterile NS with minimal return  Lab Results:   [facility] 12/05/11 0435  WBC 11.7*  HGB 12.8  HCT 39.1  PLT 350   BMET  Basename 12/05/11 0435 12/04/11 0420  NA 132* 134*  K 4.0 3.7  CL 94* 99  CO2 25 26  GLUCOSE 101* 83  BUN 11 15  CREATININE 0.61 0.62  CALCIUM 8.8 8.4   PT/INR  Basename 12/04/11 0420  LABPROT 14.8  INR 1.14   ABG No results found for this basename: PHART:2,PCO2:2,PO2:2,HCO3:2 in the last 72 hours Results for orders placed during the hospital encounter of 12/03/11  URINE CULTURE     Status: Normal   Collection Time   12/03/11  9:16 AM      Component Value Range Status Comment   Specimen Description URINE, CLEAN CATCH   Final    Special Requests NONE   Final    Culture  Setup Time [id number]   Final    Colony Count 50,000 COLONIES/ML   Final    Culture     Final    Value: Multiple bacterial morphotypes present, none predominant. Suggest appropriate recollection if clinically indicated.   Report Status 12/05/2011 FINAL   Final   CULTURE, ROUTINE-ABSCESS     Status: Normal (Preliminary result)   Collection Time   12/04/11  1:30 PM      Component Value Range Status Comment   Specimen Description DRAINAGE   Final    Special Requests Normal   Final    Gram Stain     Final    Value: ABUNDANT WBC PRESENT,BOTH PMN AND MONONUCLEAR     NO SQUAMOUS EPITHELIAL CELLS SEEN     NO ORGANISMS SEEN   Culture NO GROWTH   Final    Report Status PENDING   Incomplete     Studies/Results: Ct Guided Abscess Drain  12/04/2011  *RADIOLOGY REPORT*  Indication: Recurrent/symptomatic left lower quadrant diverticular abscess  CT GUIDED LEFT LOWER QUADRANT DIVERTICULAR ABSCESS DRAINAGE CATHETER PLACEMENT  Comparison: Abdominal CT - 12/03/2011; 11/29/2011; 11/04/2011; 10/29/2011; CT guided drainage catheter placement - 10/30/2011  Medications: Fentanyl 100 mcg IV; Versed 2 mg IV  Total Moderate Sedation time: 50 minutes  Contrast: None  Complications: None immediate  Technique / Findings:  Informed written consent was obtained from the patient after a discussion of the risks, benefits and alternatives to treatment. The patient was placed supine on the CT gantry and a pre procedural CT was performed re-demonstrating the known abscess/fluid collection within the left lower abdominal quadrant.  The procedure was planned.   A timeout was performed prior to the initiation of the procedure.  The left lower abdomen was prepped and draped in the usual sterile fashion.   The overlying soft tissues were anesthetized with 1% lidocaine with epinephrine. Utilizing a  22 gauge spinal needle, appropriate trajectory was planned with special attention paid to avoid the low-lying small bowel.  An 18 gauge trocar needle was advanced in to the abscess/fluid collection and a short Amplatz super stiff wire was coiled within the abscess/fluid collection. Appropriate positioning was confirmed with a limited CT scan.  To ensure the small bowel had not been transgressed, reformatted sagittal images were performed and reviewed at the CT workstation console.  The tract was serially dilated allowing placement of a 10 [place] all-purpose drainage catheter.  Appropriate positioning was confirmed with a limited postprocedural CT scan.  5  ml of purulent, slightly bloody fluid was aspirated.  The tube was connected to a drainage bag and sutured in place.  A dressing was placed.  The patient tolerated the procedure well without immediate post procedural complication.  Impression:  Successful CT guided placement of a 10 [place] all purpose drain catheter into the left lower abdominal quadrant with aspiration of 5 mL of purulent, bloody fluid.  Samples were sent to the laboratory as requested by the ordering clinical team.  Original Report Authenticated By: Waynard Reeds, M.D.    Anti-infectives: Anti-infectives     Start     Dose/Rate Route Frequency Ordered Stop   12/03/11 1700   ertapenem (INVANZ) 1 g in sodium chloride 0.9 % 50 mL IVPB        1 g 100 mL/hr over 30 Minutes Intravenous Every 24 hours 12/03/11 1608            Assessment/Plan: s/p recurrent LLQ diverticular abscess drainage 5/21; check final cx's, cont drain flushes, ambulate, diet per CCS; check f/u CT next week with drain injection   LOS: 3 days    Prestyn Stanco,D [facility] 12/06/2011

## 2011-12-06 NOTE — Progress Notes (Signed)
Subjective: No pain, family and patient want to know if she can have full liquids and go home over with drain. Less confused, doesn't like the chair she has in room.   Objective: Vital signs in last 24 hours: Temp:  [97.5 F (36.4 C)-98.3 F (36.8 C)] 97.6 F (36.4 C) (05/23 0630) Pulse Rate:  [71-79] 71  (05/23 0630) Resp:  [19-20] 20  (05/23 0630) BP: (131-145)/(72-79) 131/74 mmHg (05/23 0630) SpO2:  [95 %-96 %] 95 % (05/23 0630) Last BM Date: 12/06/11  10 ml thru drain, flushed and some brown purulent material in it now, afebrile, VSS, no labs today.  Intake/Output from previous day: 05/22 0701 - 05/23 0700 In: 1120 [P.O.:320; I.V.:800] Out: 735 [Urine:725; Drains:10] Intake/Output this shift: Total I/O In: 240 [P.O.:240] Out: -   General appearance: alert, cooperative and no distress GI: soft, non-tender; bowel sounds normal; no masses,  no organomegaly and drain has some brownish thick purulent fluid in it now mostly in tubing.  Lab Results:   [facility] 12/05/11 0435  WBC 11.7*  HGB 12.8  HCT 39.1  PLT 350    BMET  Basename 12/05/11 0435 12/04/11 0420  NA 132* 134*  K 4.0 3.7  CL 94* 99  CO2 25 26  GLUCOSE 101* 83  BUN 11 15  CREATININE 0.61 0.62  CALCIUM 8.8 8.4   PT/INR  Basename 12/04/11 0420  LABPROT 14.8  INR 1.14     Lab 12/05/11 0435 12/03/11 0920  AST 16 16  ALT 9 10  ALKPHOS 79 87  BILITOT 0.5 0.4  PROT 6.7 7.2  ALBUMIN 3.1* 3.4*     Lipase  No results found for this basename: lipase     Studies/Results: Ct Guided Abscess Drain  12/04/2011  *RADIOLOGY REPORT*  Indication: Recurrent/symptomatic left lower quadrant diverticular abscess  CT GUIDED LEFT LOWER QUADRANT DIVERTICULAR ABSCESS DRAINAGE CATHETER PLACEMENT  Comparison: Abdominal CT - 12/03/2011; 11/29/2011; 11/04/2011; 10/29/2011; CT guided drainage catheter placement - 10/30/2011  Medications: Fentanyl 100 mcg IV; Versed 2 mg IV  Total Moderate Sedation time: 50 minutes   Contrast: None  Complications: None immediate  Technique / Findings:  Informed written consent was obtained from the patient after a discussion of the risks, benefits and alternatives to treatment. The patient was placed supine on the CT gantry and a pre procedural CT was performed re-demonstrating the known abscess/fluid collection within the left lower abdominal quadrant.  The procedure was planned.   A timeout was performed prior to the initiation of the procedure.  The left lower abdomen was prepped and draped in the usual sterile fashion.   The overlying soft tissues were anesthetized with 1% lidocaine with epinephrine. Utilizing a 22 gauge spinal needle, appropriate trajectory was planned with special attention paid to avoid the low-lying small bowel.  An 18 gauge trocar needle was advanced in to the abscess/fluid collection and a short Amplatz super stiff wire was coiled within the abscess/fluid collection. Appropriate positioning was confirmed with a limited CT scan.  To ensure the small bowel had not been transgressed, reformatted sagittal images were performed and reviewed at the CT workstation console.  The tract was serially dilated allowing placement of a 10 [place] all-purpose drainage catheter.  Appropriate positioning was confirmed with a limited postprocedural CT scan.  5 ml of purulent, slightly bloody fluid was aspirated.  The tube was connected to a drainage bag and sutured in place.  A dressing was placed.  The patient tolerated the procedure well without  immediate post procedural complication.  Impression:  Successful CT guided placement of a 10 [place] all purpose drain catheter into the left lower abdominal quadrant with aspiration of 5 mL of purulent, bloody fluid.  Samples were sent to the laboratory as requested by the ordering clinical team.  Original Report Authenticated By: Waynard Reeds, M.D.    Medications:    . diphenhydrAMINE  12.5 mg Intramuscular Once  . enoxaparin  40 mg  Subcutaneous Q24H  . ertapenem (INVANZ) IV  1 g Intravenous Q24H  . famotidine (PEPCID) IV  20 mg Intravenous Q12H  . levothyroxine  88 mcg Oral Daily  . metoprolol tartrate  25 mg Oral Daily    Assessment/Plan 1. Multiloculated diverticular abscess, with ongoing abdominal discomfort and pain.  2. Thyroid disease on supple  3. Hypertension  4. Hyperlipidemia  5. History of diverticulitis 20 years ago.  6. 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  7. UTI culture 50K multiple species   Day 3 of Invanz, feels better, mobilize, Go to full liquids, discuss when to repeat CT, and if she can go home over WE with drain.    LOS: 3 days    Kache Mcclurg 12/06/2011

## 2011-12-06 NOTE — Progress Notes (Signed)
Looks better

## 2011-12-07 LAB — CBC
MCH: 29.6 pg (ref 26.0–34.0)
MCHC: 32.9 g/dL (ref 30.0–36.0)
Platelets: 356 10*3/uL (ref 150–400)

## 2011-12-07 LAB — CULTURE, ROUTINE-ABSCESS

## 2011-12-07 MED ORDER — ACETAMINOPHEN 500 MG PO TABS
1000.0000 mg | ORAL_TABLET | Freq: Four times a day (QID) | ORAL | Status: DC | PRN
  Administered 2011-12-07 – 2011-12-08 (×4): 1000 mg via ORAL
  Filled 2011-12-07 (×4): qty 2

## 2011-12-07 MED ORDER — ACETAMINOPHEN 650 MG RE SUPP: 650.0000 mg | Freq: Four times a day (QID) | RECTAL | Status: DC | PRN

## 2011-12-07 NOTE — Progress Notes (Signed)
UR complete 

## 2011-12-07 NOTE — Progress Notes (Signed)
  Subjective: Sitting on side of bed, daughter present - tender along spine - apparently has compression fractures per daughter.   Objective: Vital signs in last 24 hours: Temp:  [97.7 F (36.5 C)-98.6 F (37 C)] 97.7 F (36.5 C) (05/24 1400) Pulse Rate:  [74-86] 82  (05/24 1400) Resp:  [18-20] 18  (05/24 1400) BP: (146-151)/(74-85) 149/74 mmHg (05/24 1400) SpO2:  [95 %-98 %] 96 % (05/24 1400) Last BM Date: 12/06/11  Intake/Output from previous day: 05/23 0701 - 05/24 0700 In: 3240 [P.O.:840; I.V.:2400] Out: 1500 [Urine:1500] Intake/Output this shift: Total I/O In: 700 [P.O.:240; I.V.:460] Out: -   Physical exam :  Drain intact with feculent drainage in tubing, bloody serous fluid in bag.  Insertion site clean and dry.  Abdomen soft, positive bowel sounds.   Lab Results:   Basename 12/07/11 0806 12/05/11 0435  WBC 9.6 11.7*  HGB 12.8 12.8  HCT 38.9 39.1  PLT 356 350   BMET  Basename 12/05/11 0435  NA 132*  K 4.0  CL 94*  CO2 25  GLUCOSE 101*  BUN 11  CREATININE 0.61  CALCIUM 8.8     Anti-infectives: Anti-infectives     Start     Dose/Rate Route Frequency Ordered Stop   12/03/11 1700   ertapenem (INVANZ) 1 g in sodium chloride 0.9 % 50 mL IVPB        1 g 100 mL/hr over 30 Minutes Intravenous Every 24 hours 12/03/11 1608            Assessment/Plan: Multi-loculated diverticular abscess - recurrent s/p drainage by IR 5/21. To continue drain, abx and flushes - being managed by CCS.  For d/c home with drain - will need at least once daily flush of 5-10 ml saline.      LOS: 4 days    Turner Kunzman D 12/07/2011

## 2011-12-07 NOTE — Progress Notes (Signed)
  Subjective: Extremely confused, almost nonfunctional, she can't get up or do anything on her own, right now.  She is still on Mikayla Villanueva and Mikayla Villanueva, although she has not had any Mikayla Villanueva. Having allot of voiding and some loose stool, daughter says she had constipation at home.  Objective: Vital signs in last 24 hours: Temp:  [97.7 F (36.5 C)-98.6 F (37 C)] 98 F (36.7 C) (05/24 0556) Pulse Rate:  [69-86] 74  (05/24 0556) Resp:  [18-20] 20  (05/24 0556) BP: (128-151)/(60-85) 146/79 mmHg (05/24 0556) SpO2:  [95 %-98 %] 95 % (05/24 0556) Last BM Date: 12/06/11  Nothing recorded from drain, 2 BM's yesterday, VSS, afebrile, WBC is back to normal, Drain looks just like it did yesterday.    Intake/Output from previous day: 05/23 0701 - 05/24 0700 In: 3240 [P.O.:840; I.V.:2400] Out: 1500 [Urine:1500] Intake/Output this shift:    General appearance: confuse almost sedated, doesn't know where she is.  Thinks she isn't going to make it. GI: She has back pain and that was better with K-pad, that seems to be her 1st complaint, but when ask specifically about abdomen she point to right side.  It was mostly her left on admission.  Lab Results:   [facility] 12/07/11 0806 12/05/11 0435  WBC 9.6 11.7*  HGB 12.8 12.8  HCT 38.9 39.1  PLT 356 350    BMET  Basename 12/05/11 0435  NA 132*  K 4.0  CL 94*  CO2 25  GLUCOSE 101*  BUN 11  CREATININE 0.61  CALCIUM 8.8   PT/INR No results found for this basename: LABPROT:2,INR:2 in the last 72 hours   Lab 12/05/11 0435 12/03/11 0920  AST 16 16  ALT 9 10  ALKPHOS 79 87  BILITOT 0.5 0.4  PROT 6.7 7.2  ALBUMIN 3.1* 3.4*     Lipase  No results found for this basename: lipase     Studies/Results: No results found.  Medications:    . diphenhydrAMINE  12.5 mg Intramuscular Once  . enoxaparin  40 mg Subcutaneous Q24H  . ertapenem (INVANZ) IV  1 g Intravenous Q24H  . famotidine (PEPCID) IV  20 mg Intravenous Q12H  . levothyroxine   88 mcg Oral Daily  . metoprolol tartrate  25 mg Oral Daily    Assessment/Plan 1. Multiloculated diverticular abscess, with ongoing abdominal discomfort and pain. Hospitalized 4/15 with same.Mikayla Villanueva) 2. Thyroid disease on supple  3. Hypertension  4. Hyperlipidemia  5. History of diverticulitis 20 years ago.  6. 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  7. UTI culture 50K multiple species 8. Worsening Confusion, and deconditioning. 9.Back pain   Plan:  Continue Invanz, stop all narcotics, Kpad for her back, advance to low residual diet, OT/PT.  Decrease IV fluids.  See how she does.  We had hoped to send home for W/E on Augmentin and get CT next week, follow up with Mikayla Villanueva.  She can't go home this way.     LOS: 4 days    Mikayla Villanueva 12/07/2011

## 2011-12-07 NOTE — Progress Notes (Signed)
Spoke with patient and dtr at bedside. Patient sleepy but not confused. Per dtr plan for d/c home with drain. She would like to use [facility] for [facility] services. Also requested a 3-in-1 for home use. States she has a walker. Awaiting PT/OT evaluations for further recommendations.

## 2011-12-07 NOTE — Progress Notes (Signed)
I have discussed surgical therapy with the patient and family.  They are not interested.  I'm not sure she will get any better without laparotomy or laparoscopy. Check stool for C DIFF.

## 2011-12-08 MED ORDER — LINEZOLID 600 MG PO TABS: 600.0000 mg | ORAL_TABLET | Freq: Two times a day (BID) | ORAL | Status: AC

## 2011-12-08 NOTE — Progress Notes (Signed)
Left message for [facility] management that per Dr. Biagio Quint patient can be discharged today if homehealth and drain care set up

## 2011-12-08 NOTE — Progress Notes (Signed)
Patient and daughters/caregivers states understanding of discharge instructions and admendments to meds to stop taking cipro and flagyl. Assessment unchanged. Showed caregivers how to empty drain. Discharged via wheelchair. rx x1 for zyvox

## 2011-12-08 NOTE — Progress Notes (Signed)
Spoke to OR nurse calling for dr. Dwain Sarna states to stop cipro and flagyl at home only use zyvox and patient may have home PT, MD states if patient needs to stay another day is ok informed nurse patient anxious to go home just needed those orders clarified states ok

## 2011-12-08 NOTE — Progress Notes (Signed)
Pt to dc home with HHPT/HHA. [facility] notified of d/c.    Mikayla Villanueva [phone]

## 2011-12-08 NOTE — Progress Notes (Signed)
Left message with [facility] management to ensure everything is prepared for [facility] before discharge.

## 2011-12-08 NOTE — Progress Notes (Signed)
PRN tylenol now available to be given to Pt, nurse entered room to give pt the medications since she asked for it earlier. The daughter in the room told the nurse not to wake the Pt up to give her the medication because she had not been sleeping for too long, and that she would call the nurse when and if the Pt woke up.

## 2011-12-08 NOTE — Evaluation (Signed)
Occupational Therapy Evaluation Patient Details Name: Mikayla Villanueva MRN: [id number] DOB: 03/14/1921 Today's Date: 12/08/2011 Time: 4098-1191 OT Time Calculation (min): 18 min  OT Assessment / Plan / Recommendation Clinical Impression  78 yo female with recurring problems with abd pain now requiring drain and displays decreased strength and independence with ADL. Family declines HHOT services at this time.    OT Assessment  All further OT needs can be met in the next venue of care    Follow Up Recommendations  Home health OT;Other (comment) (however pt's famly currently declines HHOT)    Barriers to Discharge      Equipment Recommendations  3 in 1 bedside comode;Other (comment) (already delivered)    Recommendations for Other Services    Frequency       Precautions / Restrictions Precautions Precautions: Fall Restrictions Weight Bearing Restrictions: No        ADL  Grooming: Simulated;Set up;Supervision/safety;Wash/dry hands Where Assessed - Grooming: Unsupported sitting Lower Body Bathing: Minimal assistance;Other (comment) (standing balance for family to assist) Lower Body Dressing: Minimal assistance;Other (comment) (standing balance for family to assist) Toilet Transfer: Simulated;Minimal assistance;Other (comment) (hand held assist to chair she wanted to sit in by door) Toileting - Clothing Manipulation and Hygiene: Minimal assistance;Other (comment) (standing balance for family to assist) Tub/Shower Transfer Method: Not assessed Equipment Used: Rolling walker ADL Comments: Family was in the process of finishing dressing patient when OT arrived. Discussed HHOT to help increase her independence with ADL but family declines HHOT and would like to start with just HHPT as to not overwhelm the patient. They plan to assist with ADLs. Instructed in caregiver education strategies to increase patient safety with tasks including where to stand to help her with pulling up clothing,  using RW in front of patient for her to hold if they are helping pull up clothes, how to provide lift asssistance if needed. 3in1 already delivered and family took home Discussed how to adjust 3in1 for appropriate height. Pt also has a [place] but family states she will start with sponge bathing. Pt to discharge today. No further questions by family.     OT Diagnosis: Generalized weakness  OT Problem List: Decreased strength;Decreased activity tolerance;Impaired balance (sitting and/or standing) OT Treatment Interventions:     OT Goals    Visit Information  Last OT Received On: 12/08/11 Assistance Needed: +1    Subjective Data  Subjective: want to sit by the door Patient Stated Goal: home   Prior Functioning  Home Living Lives With: Family  Available Help at Discharge: Family Type of Home: House Home Access: Stairs to enter Secretary/administrator of Steps: 1 Entrance Stairs-Rails: None Home Layout: One level Bathroom Shower/Tub: Engineer, manufacturing systems: Handicapped height Home Adaptive Equipment: Bedside commode/3-in-1;Wheelchair - manual (family member can get RW, tubbench) Prior Function Level of Independence: Needs assistance Communication Communication: No difficulties    Cognition  Overall Cognitive Status: Appears within functional limits for tasks assessed/performed Arousal/Alertness: Awake/alert Orientation Level: Appears intact for tasks assessed Behavior During Session: Flat affect    Extremity/Trunk Assessment Right Upper Extremity Assessment RUE ROM/Strength/Tone: Within functional levels Left Upper Extremity Assessment LUE ROM/Strength/Tone: Within functional levels Right Lower Extremity Assessment RLE ROM/Strength/Tone: WFL for tasks assessed RLE Sensation: WFL - Light Touch;WFL - Proprioception RLE Coordination: WFL - gross/fine motor Left Lower Extremity Assessment LLE ROM/Strength/Tone: WFL for tasks assessed LLE Sensation: WFL - Light  Touch;WFL - Proprioception LLE Coordination: WFL - gross/fine motor Trunk Assessment Trunk Assessment: Kyphotic  Mobility Bed Mobility Bed Mobility: Not assessed Transfers Transfers: Sit to Stand;Stand to Sit Sit to Stand: 4: Min assist;With upper extremity assist;From chair/3-in-1 Stand to Sit: 4: Min assist;With upper extremity assist;To chair/3-in-1 Details for Transfer Assistance: verbal cues for hand placement   Exercise    Balance Balance Balance Assessed: Yes Static Standing Balance Static Standing - Balance Support: Bilateral upper extremity supported Static Standing - Level of Assistance: 4: Min assist Static Standing - Comment/# of Minutes: 1  End of Session OT - End of Session Activity Tolerance: Patient tolerated treatment well Patient left: in chair;with call bell/phone within reach;with family/visitor present   Lennox Laity [phone] 12/08/2011, 4:01 PM

## 2011-12-08 NOTE — Progress Notes (Signed)
Dr. Biagio Quint on floor to discharge patient. Informed him PT recommends HH PT and aide, need order. Also that [place] management states home health is prepared for discharge need orders for drain flushes. Spoke to [place] with [facility] informed her drain flushed twice today, states will send out nurse tomorrow to begin bid flushes.

## 2011-12-08 NOTE — Progress Notes (Addendum)
  Subjective: Feels well.  Pain free, tolerating diet  Objective: Vital signs in last 24 hours: Temp:  [97.7 F (36.5 C)-98.1 F (36.7 C)] 98.1 F (36.7 C) (05/25 0618) Pulse Rate:  [82-87] 84  (05/25 0919) Resp:  [16-18] 16  (05/25 0618) BP: (138-157)/(74-87) 138/78 mmHg (05/25 0919) SpO2:  [96 %-98 %] 96 % (05/25 0618) Last BM Date: 12/08/11  Intake/Output from previous day: 05/24 0701 - 05/25 0700 In: 1911.7 [P.O.:240; I.V.:1271.7; IV Piggyback:400] Out: 1 [Stool:1] Intake/Output this shift: Total I/O In: -  Out: 150 [Urine:150]  General appearance: alert, cooperative and no distress Resp: nonlabored Cardio: normal rate GI: soft, non-tender; bowel sounds normal; no masses,  no organomegaly, her drain has some thin brown material  Lab Results:   Basename 12/07/11 0806  WBC 9.6  HGB 12.8  HCT 38.9  PLT 356   BMET No results found for this basename: NA:2,K:2,CL:2,CO2:2,GLUCOSE:2,BUN:2,CREATININE:2,CALCIUM:2 in the last 72 hours PT/INR No results found for this basename: LABPROT:2,INR:2 in the last 72 hours ABG No results found for this basename: PHART:2,PCO2:2,PO2:2,HCO3:2 in the last 72 hours  Studies/Results: No results found.  Anti-infectives: Anti-infectives     Start     Dose/Rate Route Frequency Ordered Stop   12/03/11 1700   ertapenem (INVANZ) 1 g in sodium chloride 0.9 % 50 mL IVPB        1 g 100 mL/hr over 30 Minutes Intravenous Every 24 hours 12/03/11 1608            Assessment/Plan: s/p * No surgery found * she seems to be doing well.  tolerating diet, pain free, AF, and wbc normalized.  She is wanting to be discharged today and says that she has home health set up for drain care. She has an appt on Tues with our office and she can probably go home with oral abx and f/u this week with Dr. Gerrit Friends  LOS: 5 days    Lodema Pilot DAVID 12/08/2011  I spoke with Dr. Algis Liming with ID and he reviewed the cultures and past medical treatments.  He  recommended Zyvox 600mg  BID x10 days and f/u next week.  The patients daughter is present and feels as though she can take care of her with home health assistance with the drain.

## 2011-12-08 NOTE — Progress Notes (Signed)
Pt co/ pain, RN entered room to inform Pt and daughter about the medication times and frequency. The daughter became confrontational and told the RN it was given to the Pt Q4 hrs yesterday. RN informed the daughter the Pt was on prescribed two pain medication options yesterday and was left with 1000 mg Tylenol which she received at 2125. I offered pillows to reposition the Pt until she could get the Tylenol. The daughter quickly responded with NO! The Pt siad yes she would like the pillows for repositioning. When the nurse tried to step in to place the pillows she was asked to leave by the daughter. The daughter said she would deal with a Will this morning.

## 2011-12-08 NOTE — Evaluation (Signed)
Physical Therapy Evaluation Patient Details Name: Mikayla Villanueva MRN: [id number] DOB: [birth year] Today's Date: 12/08/2011 Time: 4098-1191 PT Time Calculation (min): 41 min  PT Assessment / Plan / Recommendation Clinical Impression  78 yo female with recurring problems with abd pain now requiring drain who needs continued HHPT to improve strength and fuctional mobilty. She would also benefit from [facility] if insurance will cover it    PT Assessment  Patient needs continued PT services    Follow Up Recommendations  Home health PT (HHaide if covered by insurance)    Barriers to Discharge        lEquipment Recommendations  None recommended by PT    Recommendations for Other Services     Frequency Min 3X/week    Precautions / Restrictions Precautions Precautions: Fall Restrictions Weight Bearing Restrictions: No   Pertinent Vitals/Pain Pt with little c/o pain      Mobility  Bed Mobility Bed Mobility: Not assessed Transfers Transfers: Sit to Stand;Stand to Sit Sit to Stand: 4: Min assist Stand to Sit: 4: Min assist Details for Transfer Assistance: pt cues to push up with arms, needs extra time Ambulation/Gait Ambulation/Gait Assistance: 4: Min assist Ambulation Distance (Feet): 10 Feet Assistive device: Rolling walker Ambulation/Gait Assistance Details: assist to direct RW and provide balance assist Gait Pattern: Step-to pattern;Decreased step length - right;Decreased step length - left;Decreased stance time - right;Decreased stance time - left;Decreased stride length;Trunk flexed;Narrow base of support;Shuffle Gait velocity: slow General Gait Details: pt hunched over RW with very short shuffling steps Pt self limits distance Stairs: No (verball reviewed one step negotiation with daughter) Naval architect Mobility: No (verbally reviewed going up one step with wheelchair)    Exercises     PT Diagnosis: Difficulty walking;Abnormality of gait;Generalized  weakness  PT Problem List: Decreased strength;Decreased activity tolerance;Decreased balance;Decreased mobility;Decreased knowledge of use of DME PT Treatment Interventions:     PT Goals Acute Rehab PT Goals PT Goal Formulation: With patient/family Time For Goal Achievement: 12/22/11 Potential to Achieve Goals: Good Pt will go Supine/Side to Sit: with modified independence PT Goal: Supine/Side to Sit - Progress: Goal set today Pt will go Sit to Stand: with modified independence PT Goal: Sit to Stand - Progress: Goal set today Pt will Ambulate: >150 feet;with modified independence;with least restrictive assistive device PT Goal: Ambulate - Progress: Goal set today Pt will Go Up / Down Stairs: 1-2 stairs;with min assist;with least restrictive assistive device PT Goal: Up/Down Stairs - Progress: Goal set today  Visit Information  Last PT Received On: 12/08/11 Assistance Needed: +1    Subjective Data  Subjective: pt agreeable to walk a little bit Patient Stated Goal: to go home today   Prior Functioning  Home Living Lives With: Family Available Help at Discharge: Family Type of Home: House Home Access: Stairs to enter Secretary/administrator of Steps: 1 Entrance Stairs-Rails: None Home Layout: One level Home Adaptive Equipment: Bedside commode/3-in-1;Wheelchair - manual (family member can get RW) Prior Function Level of Independence: Needs assistance Communication Communication: No difficulties    Cognition  Overall Cognitive Status: Appears within functional limits for tasks assessed/performed Arousal/Alertness: Awake/alert Orientation Level: Appears intact for tasks assessed Behavior During Session: Flat affect    Extremity/Trunk Assessment Right Lower Extremity Assessment RLE ROM/Strength/Tone: WFL for tasks assessed RLE Sensation: WFL - Light Touch;WFL - Proprioception RLE Coordination: WFL - gross/fine motor Left Lower Extremity Assessment LLE ROM/Strength/Tone:  WFL for tasks assessed LLE Sensation: WFL - Light Touch;WFL - Proprioception LLE Coordination:  WFL - gross/fine motor Trunk Assessment Trunk Assessment: Kyphotic   Balance Balance Balance Assessed: Yes Static Standing Balance Static Standing - Balance Support: Bilateral upper extremity supported Static Standing - Level of Assistance: 5: Stand by assistance Static Standing - Comment/# of Minutes: 1  End of Session PT - End of Session Activity Tolerance: Patient limited by fatigue Patient left: in chair;with family/visitor present   Donnetta Hail 12/08/2011, 12:53 PM

## 2011-12-11 ENCOUNTER — Ambulatory Visit (INDEPENDENT_AMBULATORY_CARE_PROVIDER_SITE_OTHER): Payer: Medicare Other | Admitting: Surgery

## 2011-12-11 ENCOUNTER — Encounter (INDEPENDENT_AMBULATORY_CARE_PROVIDER_SITE_OTHER): Payer: Self-pay | Admitting: Surgery

## 2011-12-11 VITALS — BP 115/72 | HR 84 | Temp 98.8°F | Ht 65.0 in | Wt 151.0 lb

## 2011-12-11 DIAGNOSIS — K5732 Diverticulitis of large intestine without perforation or abscess without bleeding: Secondary | ICD-10-CM

## 2011-12-11 DIAGNOSIS — K5720 Diverticulitis of large intestine with perforation and abscess without bleeding: Secondary | ICD-10-CM

## 2011-12-11 NOTE — Progress Notes (Signed)
Visit Diagnoses: 1. Diverticulitis of colon with perforation, percutaneous drainage     HISTORY: The patient is an 78 year old white female admitted earlier this spring to the general surgery service with perforated diverticular disease with abscess. She was treated with percutaneous drainage and oral antibiotics. She approved and was discharged home. Drain was subsequently removed. Patient develop recurrent abdominal pain. She was readmitted to the hospital with a recurrent abscess and again underwent percutaneous drainage. Patient is now taking Zyvox under the direction of infectious disease consultants. She has a home health nurse irrigating the drain. She returns 3 days following discharge from the inpatient general surgery service for followup.  The patient has been offered surgical exploration and resection. At this point she refuses any surgical intervention.  PERTINENT REVIEW OF SYSTEMS: Normal daily bowel movement, denies diarrhea, no significant pain  EXAM: HEENT: normocephalic; pupils equal and reactive; sclerae clear; dentition good; mucous membranes moist CHEST: clear to auscultation bilaterally without rales, rhonchi, or wheezes CARDIAC: regular rate and rhythm without significant murmur; peripheral pulses are full ABDOMEN: Drain in LLQ with brown, thin, purulent fluid in bag; no tenderness on palpation EXT:  non-tender without edema; no deformity NEURO: no gross focal deficits; no sign of tremor   IMPRESSION: Recurrent sigmoid diverticular disease with abscess  PLAN: Patient is clinically stable with percutaneous drain in place and taking oral Zyvox for a ten-day course. She was discharged from the hospital 3 days ago. Drain has been in place for one week.  Patient will be scheduled for a followup CT scan later this week. If there is resolution of the abscess then the drain may be removed concurrently. Patient will continue to take Zyvox for one more week.  Patient will be  scheduled for followup in 3 weeks. Certainly if she has recurrence of symptoms in the interim she will require evaluation with CT scan and possible readmission.  We again discussed today the fact that if this does not resolve with percutaneous drainage and antibiotics, the patient will require surgical exploration and resection. In all likelihood she would require a colostomy placement. Again, in the presence of her family, the patient refuses any possible surgical intervention.  CT scan of the abdomen will be scheduled later this week. She will return to see me in followup in 3 weeks.  Velora Heckler, MD, FACS General & Endocrine Surgery [facility]

## 2011-12-11 NOTE — Patient Instructions (Signed)
Diverticulitis A diverticulum is a small pouch or sac on the colon. Diverticulosis is the presence of these diverticula on the colon. Diverticulitis is the irritation (inflammation) or infection of diverticula. CAUSES  The colon and its diverticula contain bacteria. If food particles block the tiny opening to a diverticulum, the bacteria inside can grow and cause an increase in pressure. This leads to infection and inflammation and is called diverticulitis. SYMPTOMS   Abdominal pain and tenderness. Usually, the pain is located on the left side of your abdomen. However, it could be located elsewhere.   Fever.   Bloating.   Feeling sick to your stomach (nausea).   Throwing up (vomiting).   Abnormal stools.  DIAGNOSIS  Your caregiver will take a history and perform a physical exam. Since many things can cause abdominal pain, other tests may be necessary. Tests may include:  Blood tests.   Urine tests.   X-ray of the abdomen.   CT scan of the abdomen.  Sometimes, surgery is needed to determine if diverticulitis or other conditions are causing your symptoms. TREATMENT  Most of the time, you can be treated without surgery. Treatment includes:  Resting the bowels by only having liquids for a few days. As you improve, you will need to eat a low-fiber diet.   Intravenous (IV) fluids if you are losing body fluids (dehydrated).   Antibiotic medicines that treat infections may be given.   Pain and nausea medicine, if needed.   Surgery if the inflamed diverticulum has burst.  HOME CARE INSTRUCTIONS   Try a clear liquid diet (broth, tea, or water for as long as directed by your caregiver). You may then gradually begin a low-fiber diet as tolerated. A low-fiber diet is a diet with less than 10 grams of fiber. Choose the foods below to reduce fiber in the diet:   White breads, cereals, rice, and pasta.   Cooked fruits and vegetables or soft fresh fruits and vegetables without the skin.     Ground or well-cooked tender beef, ham, veal, lamb, pork, or poultry.   Eggs and seafood.   After your diverticulitis symptoms have improved, your caregiver may put you on a high-fiber diet. A high-fiber diet includes 14 grams of fiber for every 1000 calories consumed. For a standard 2000 calorie diet, you would need 28 grams of fiber. Follow these diet guidelines to help you increase the fiber in your diet. It is important to slowly increase the amount fiber in your diet to avoid gas, constipation, and bloating.   Choose whole-grain breads, cereals, pasta, and brown rice.   Choose fresh fruits and vegetables with the skin on. Do not overcook vegetables because the more vegetables are cooked, the more fiber is lost.   Choose more nuts, seeds, legumes, dried peas, beans, and lentils.   Look for food products that have greater than 3 grams of fiber per serving on the Nutrition Facts label.   Take all medicine as directed by your caregiver.   If your caregiver has given you a follow-up appointment, it is very important that you go. Not going could result in lasting (chronic) or permanent injury, pain, and disability. If there is any problem keeping the appointment, call to reschedule.  SEEK MEDICAL CARE IF:   Your pain does not improve.   You have a hard time advancing your diet beyond clear liquids.   Your bowel movements do not return to normal.  SEEK IMMEDIATE MEDICAL CARE IF:   Your pain becomes   worse.   You have an oral temperature above 102 F (38.9 C), not controlled by medicine.   You have repeated vomiting.   You have bloody or black, tarry stools.   Symptoms that brought you to your caregiver become worse or are not getting better.  MAKE SURE YOU:   Understand these instructions.   Will watch your condition.   Will get help right away if you are not doing well or get worse.  Document Released: 04/11/2005 Document Revised: 06/21/2011 Document Reviewed:  08/07/2010 Exit[employer] Patient Information 2012 [employer]. 

## 2011-12-14 ENCOUNTER — Ambulatory Visit (HOSPITAL_COMMUNITY)
Admission: RE | Admit: 2011-12-14 | Discharge: 2011-12-14 | Disposition: A | Discharge status: Home / Self Care | Payer: Medicare Other | Source: Ambulatory Visit | Attending: Surgery | Admitting: Surgery

## 2011-12-14 ENCOUNTER — Encounter (HOSPITAL_COMMUNITY): Payer: Self-pay

## 2011-12-14 ENCOUNTER — Ambulatory Visit (HOSPITAL_COMMUNITY)
Admission: RE | Admit: 2011-12-14 | Discharge: 2011-12-14 | Disposition: A | Discharge status: Home / Self Care | Payer: Medicare Other | Source: Ambulatory Visit | Attending: Radiology | Admitting: Radiology

## 2011-12-14 DIAGNOSIS — N731 Chronic parametritis and pelvic cellulitis: Secondary | ICD-10-CM | POA: Insufficient documentation

## 2011-12-14 DIAGNOSIS — K5730 Diverticulosis of large intestine without perforation or abscess without bleeding: Secondary | ICD-10-CM | POA: Insufficient documentation

## 2011-12-14 DIAGNOSIS — X58XXXA Exposure to other specified factors, initial encounter: Secondary | ICD-10-CM | POA: Insufficient documentation

## 2011-12-14 DIAGNOSIS — S22009A Unspecified fracture of unspecified thoracic vertebra, initial encounter for closed fracture: Secondary | ICD-10-CM | POA: Insufficient documentation

## 2011-12-14 DIAGNOSIS — K5720 Diverticulitis of large intestine with perforation and abscess without bleeding: Secondary | ICD-10-CM

## 2011-12-14 MED ORDER — IOHEXOL 300 MG/ML  SOLN
100.0000 mL | Freq: Once | INTRAMUSCULAR | Status: AC | PRN
  Administered 2011-12-14: 100 mL via INTRAVENOUS

## 2011-12-14 NOTE — Progress Notes (Signed)
CT done today for diverticular abscess s/p perc drain placement on 5/21. CT findings noted with improvement but still some mild inflammatory changes present. Also new finding of small locule of gas in bladder, not previously seen. Drainage output in bag was beige/purulent. Drain injection was ordered/completed and finds evidence of communication to colon. Unable to tell if there is a communication to the bladder as contrast from CT already in the bladder. Discussed with family. They will follow up with surgeon.  Mikayla Villanueva [facility] 12/14/2011 4:01 PM

## 2011-12-17 ENCOUNTER — Telehealth (INDEPENDENT_AMBULATORY_CARE_PROVIDER_SITE_OTHER): Payer: Self-pay

## 2011-12-17 NOTE — Discharge Summary (Signed)
Physician Discharge Summary  Patient ID: Mikayla Villanueva MRN: [id number] DOB/AGE: [birth year] 78 y.o.  Admit date: 12/03/2011 Discharge date: 12/17/2011  Admission Diagnoses: 1. Multiloculated diverticular abscess, with ongoing abdominal discomfort and pain.  2. Thyroid disease on supple  3. Hypertension  4. Hyperlipidemia  5. History of diverticulitis 20 years ago.  6. 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   Discharge Diagnoses:  Same 1. Multiloculated diverticular abscess, with ongoing abdominal discomfort and pain.  2. Thyroid disease on supple  3. Hypertension  4. Hyperlipidemia  5. History of diverticulitis 20 years ago.  6. 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  Active Problems:  * No active hospital problems. *    PROCEDURES: Successful placement of a 10 French drain into the abscess/fluid collection within the left lower abdominal quadrant 12/04/11 Dr. Simonne Come, IR  Hospital Course: This is an 78 year old female who was admitted on 10/29/2011 who had a week of abdominal pain she was found to have a urinary tract infection. She was treated with oral Cipro and got a second course was ultimately admitted with a CT scan showing a 5.5 x 9.5 cm multiloculated fluid collection left lower quadrant and a thickened sigmoid colon with multiple sigmoid diverticula. She was placed on IV antibiotics. On 10/30/2011 Dr. Fredia Sorrow placed a percutaneous drainage of the abscess site which was injected later injected no with fistulous communication. The drain was removed and she was sent home on antibiotics. She did well for a couple days but, had pain again she's been back to her primary care twice, repeat CT shows multiloculated collection posterior to the bladder, no different than it was a with repeat CT scan last week, but ongoing collection. We plan to admit her and start Invanz, ask IR to drain tomorrow. She is not keenly interested in surgery if it can be  avoided. She has failed on Cipro and flagyl. She was admitted and underwent percutaneous drainage of the site.  She was maintained on IV antibiotics, she became very confused after the procedure,  She go to the point where she could not even safely stand to go to the BR. Her diet was advanced and she was seen and discharged by DR. Layton, on zyvox for 10 days. Family and patient had not wanted surgery.she will follow up in the office with Dr. Gerrit Friends.   Disposition: 06-Home-Health Care Svc  Discharge Orders    Future Appointments: Provider: Department: Dept Phone: Center:   01/09/2012 10:15 AM Velora Heckler, MD [facility] [phone] None     Future Orders Please Complete By Expires   Increase activity slowly      Discharge instructions      Comments:   Follow up with Dr. Gerrit Friends Tuesday at your regular appointment for labs and repeat evaluation. Low residue diet  Flush drain with 5ml of normal saline twice daily, change outer dressing daily and as needed.   Call MD for:  temperature >100.4      Call MD for:  persistant nausea and vomiting      Call MD for:  severe uncontrolled pain      Call MD for:  redness, tenderness, or signs of infection (pain, swelling, redness, odor or green/yellow discharge around incision site)        Medication List  As of 12/17/2011  6:59 PM   TAKE these medications         acetaminophen 500 MG tablet   Commonly known as: TYLENOL  Take 1,000 mg by mouth 2 (two) times daily as needed. For pain      fish oil-omega-3 fatty acids 1000 MG capsule   Take 1 g by mouth 2 (two) times daily.      levothyroxine 88 MCG tablet   Commonly known as: SYNTHROID, LEVOTHROID   Take 88 mcg by mouth daily.      linezolid 600 MG tablet   Commonly known as: ZYVOX   Take 1 tablet (600 mg total) by mouth every 12 (twelve) hours.      lisinopril 20 MG tablet   Commonly known as: PRINIVIL,ZESTRIL   Take 20 mg by mouth daily.      meloxicam 7.5 MG tablet   Commonly  known as: MOBIC   Take 7.5 mg by mouth daily.      metoprolol tartrate 25 MG tablet   Commonly known as: LOPRESSOR   Take 25 mg by mouth daily.      simvastatin 40 MG tablet   Commonly known as: ZOCOR   Take 40 mg by mouth every evening.             SignedSherrie George 12/17/2011, 6:59 PM

## 2011-12-17 NOTE — Telephone Encounter (Signed)
The nurse with [facility] called and reported that the patient is having abdominal pain a 6 out of 10.  She has a drain that is putting out 20 cc a day yellow discharge.  It has not changed.  They have been flushing it daily with 5 cc up to today.  They have no further orders to flush after today.  The drain tube is not leaking and the pain is not around the tube.  It is all over the abdomen.  She is nauseated and has a migraine but she thinks the nausea may be from the migraine.  She has no appetite.   She had a scan done on Friday but they did not pull the drain.   Her daughter's name is Britta Mccreedy [phone] if you need to give instructions.  I will discuss with Dr Gerrit Friends.

## 2011-12-17 NOTE — Telephone Encounter (Signed)
I spoke to Dr Gerrit Friends and he plans to call the pt.

## 2011-12-18 NOTE — Discharge Summary (Signed)
i had discussed her [place] with ID prior to discharge.  Zyvox recommended.

## 2011-12-19 ENCOUNTER — Telehealth (INDEPENDENT_AMBULATORY_CARE_PROVIDER_SITE_OTHER): Payer: Self-pay | Admitting: Surgery

## 2011-12-19 DIAGNOSIS — K5720 Diverticulitis of large intestine with perforation and abscess without bleeding: Secondary | ICD-10-CM

## 2011-12-19 NOTE — Telephone Encounter (Signed)
Reviewed results of CT and drain injection studies with patient's daughter.  Again recommended that we proceed with laparotomy and resection with colostomy.  Patient refuses any surgical intervention.  Patient and family desire continued percutaneous drainage.  The daughter will learn how to flush drain catheter from [facility].  I will see patient back in office on June 26th and discuss possible surgery.  If she does not agree, we will obtain another drain injection study after that office visit.  Velora Heckler, MD, [facility] Office: [phone]

## 2011-12-25 ENCOUNTER — Telehealth (INDEPENDENT_AMBULATORY_CARE_PROVIDER_SITE_OTHER): Payer: Self-pay | Admitting: General Surgery

## 2011-12-25 NOTE — Telephone Encounter (Signed)
Beth, nurse with [facility], calling to get order for 1 visit per week, x 2 weeks, to monitor pt's wound and drain.  Family has been instructed to take care of both.  Order given for these two visits.

## 2012-01-01 ENCOUNTER — Encounter (INDEPENDENT_AMBULATORY_CARE_PROVIDER_SITE_OTHER): Payer: Self-pay | Admitting: General Surgery

## 2012-01-01 ENCOUNTER — Ambulatory Visit (INDEPENDENT_AMBULATORY_CARE_PROVIDER_SITE_OTHER): Payer: Medicare Other | Admitting: General Surgery

## 2012-01-01 VITALS — BP 118/82 | HR 76 | Temp 98.0°F | Resp 14 | Ht 65.0 in | Wt 135.0 lb

## 2012-01-01 DIAGNOSIS — K5732 Diverticulitis of large intestine without perforation or abscess without bleeding: Secondary | ICD-10-CM

## 2012-01-01 DIAGNOSIS — K5720 Diverticulitis of large intestine with perforation and abscess without bleeding: Secondary | ICD-10-CM

## 2012-01-01 NOTE — Patient Instructions (Signed)
Change dressing daily or as needed. Old drain site will close over the next several days. Area can get wet in shower

## 2012-01-01 NOTE — Progress Notes (Signed)
Subjective:     Patient ID: Mikayla Villanueva, female   DOB: [birth year], 78 y.o.   MRN: [id number]  HPI  78 yo wf who is a patient of Dr Ardine Eng who has been managed with a percutaneous drain for perforated sigmoid diverticulitis. This is her second drain. He has recommended surgery but the pt has refused surgery. [facility] called saying that the patient was complaining of pain around drain site and no drainage in bag. I spoke with Dr Gerrit Friends earlier today who said the drain could be removed. They deny fever, chills, nausea, vomiting, abdominal pain. Reports a good appetite  PMHx, PSHx, SOCHx, FAMHx, ALL reviewed and unchanged  Review of Systems     Objective:   Physical Exam BP 118/82  Pulse 76  Temp 98 F (36.7 C) (Temporal)  Resp 14  Ht 5\' 5"  (1.651 m)  Wt 135 lb (61.236 kg)  BMI 22.47 kg/m2 Alert, nad, nontoxic abd - soft, nontender, nondistended. No cellulitis. Perc drain in LLQ - min to no fluid in bag. Just a  1mm rim of erythema at drain site.     Assessment:     78 yo wf with h/o perforated sigmoid diverticulitis with perc drain    Plan:     Pt and family advised that she is at recurrence of diverticulitis. Pt and family voiced understanding. Drain removed without problem. Wound care given. F/u with Dr Gerrit Friends as scheduled.  Mary Sella. Andrey Campanile, MD, FACS General, Bariatric, & Minimally Invasive Surgery [facility], [place]

## 2012-01-09 ENCOUNTER — Encounter (INDEPENDENT_AMBULATORY_CARE_PROVIDER_SITE_OTHER): Payer: Medicare Other | Admitting: Surgery

## 2012-04-01 ENCOUNTER — Other Ambulatory Visit (HOSPITAL_BASED_OUTPATIENT_CLINIC_OR_DEPARTMENT_OTHER): Payer: Self-pay | Admitting: Family Medicine

## 2012-04-01 DIAGNOSIS — R109 Unspecified abdominal pain: Secondary | ICD-10-CM

## 2012-04-02 ENCOUNTER — Ambulatory Visit (HOSPITAL_BASED_OUTPATIENT_CLINIC_OR_DEPARTMENT_OTHER)
Admission: RE | Admit: 2012-04-02 | Discharge: 2012-04-02 | Disposition: A | Discharge status: Home / Self Care | Payer: Medicare Other | Source: Ambulatory Visit | Attending: Family Medicine | Admitting: Family Medicine

## 2012-04-02 DIAGNOSIS — M8448XA Pathological fracture, other site, initial encounter for fracture: Secondary | ICD-10-CM | POA: Insufficient documentation

## 2012-04-02 DIAGNOSIS — X58XXXA Exposure to other specified factors, initial encounter: Secondary | ICD-10-CM | POA: Insufficient documentation

## 2012-04-02 DIAGNOSIS — R29890 Loss of height: Secondary | ICD-10-CM | POA: Insufficient documentation

## 2012-04-02 DIAGNOSIS — I714 Abdominal aortic aneurysm, without rupture, unspecified: Secondary | ICD-10-CM | POA: Insufficient documentation

## 2012-04-02 DIAGNOSIS — R1031 Right lower quadrant pain: Secondary | ICD-10-CM | POA: Insufficient documentation

## 2012-04-02 DIAGNOSIS — Z87440 Personal history of urinary (tract) infections: Secondary | ICD-10-CM | POA: Insufficient documentation

## 2012-04-02 DIAGNOSIS — R109 Unspecified abdominal pain: Secondary | ICD-10-CM | POA: Insufficient documentation

## 2012-04-02 DIAGNOSIS — I517 Cardiomegaly: Secondary | ICD-10-CM | POA: Insufficient documentation

## 2012-04-02 DIAGNOSIS — M549 Dorsalgia, unspecified: Secondary | ICD-10-CM | POA: Insufficient documentation

## 2012-04-02 DIAGNOSIS — K5730 Diverticulosis of large intestine without perforation or abscess without bleeding: Secondary | ICD-10-CM | POA: Insufficient documentation

## 2012-04-02 DIAGNOSIS — I7090 Unspecified atherosclerosis: Secondary | ICD-10-CM | POA: Insufficient documentation

## 2012-04-02 MED ORDER — IOHEXOL 300 MG/ML  SOLN
100.0000 mL | Freq: Once | INTRAMUSCULAR | Status: AC | PRN
  Administered 2012-04-02: 100 mL via INTRAVENOUS

## 2012-06-19 IMAGING — CT CT ABCESS DRAINAGE
3 of 5 series · 6 of 16 positions shown, 9 images · non-contrast
Comparison: none

CLINICAL DATA: Pelvic diverticular abscess

[Series 5: add scan 5.0 b40f · axial · 0.74mm/px · z∈[-143,-138]mm · 2 of 4 slices shown, 5 images (1 of 3)]
[im 2/4  soft-tissue]
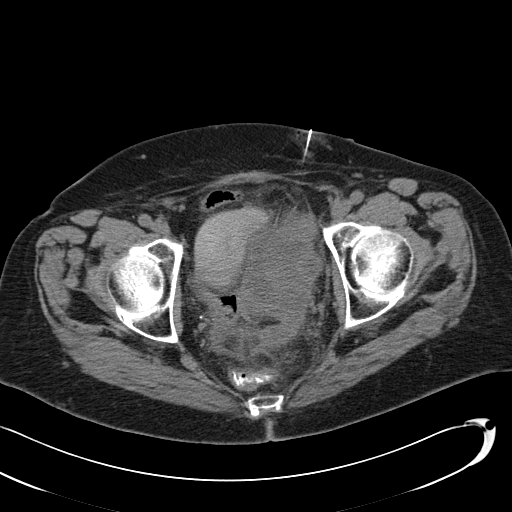
[im 2/4  lung]
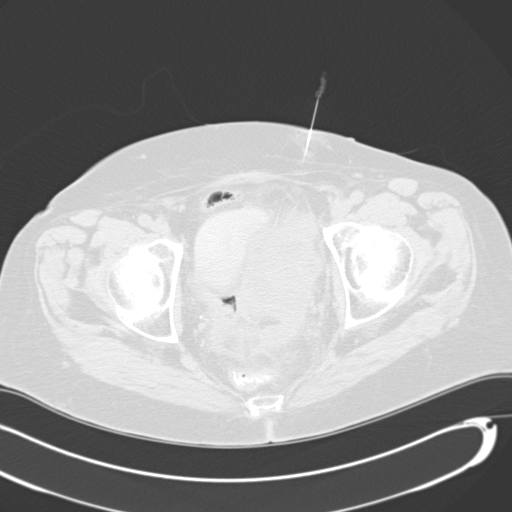
[im 2/4  bone]
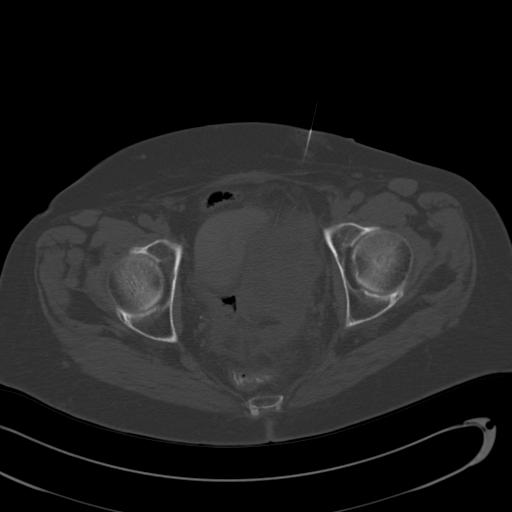
[im 3/4  soft-tissue]
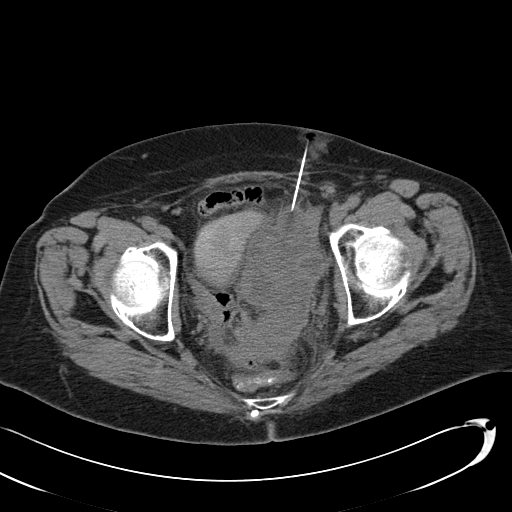
[im 3/4  lung]
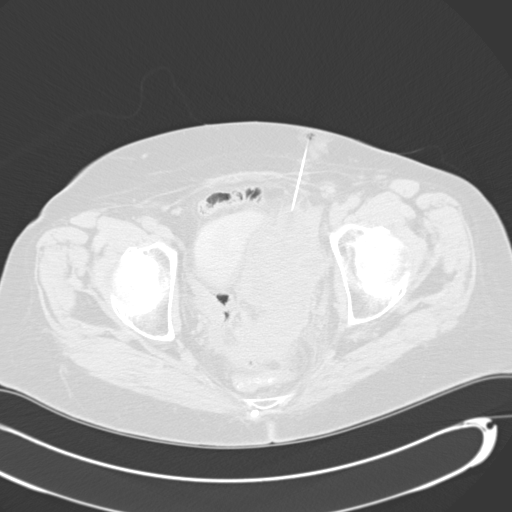

[Series 6: add scan 5.0 b40f · axial · 0.74mm/px · z∈[-145,-140]mm · 2 of 4 slices shown (2 of 3)]
[im 2/4  soft-tissue]
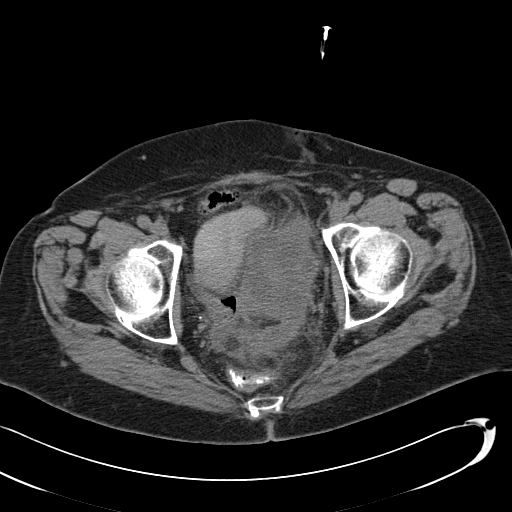
[im 3/4  soft-tissue]
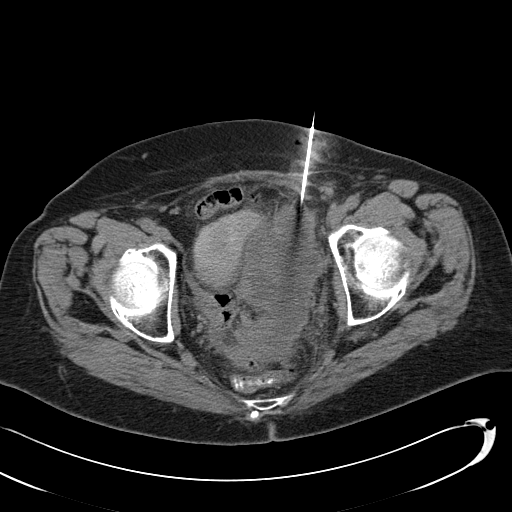

[Series 7: add scan 5.0 b40f · axial · 0.74mm/px · z∈[-145,-140]mm · 2 of 4 slices shown (3 of 3)]
[im 2/4  soft-tissue]
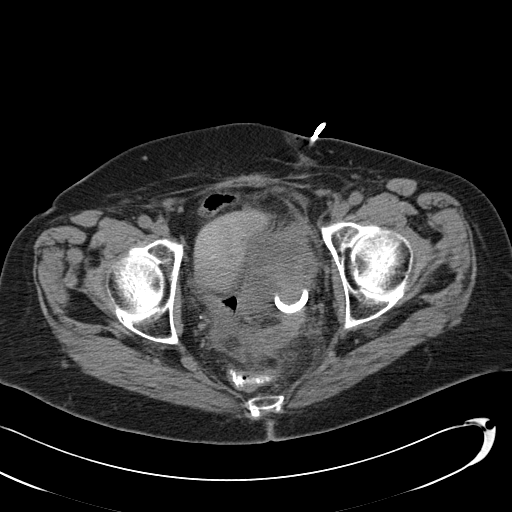
[im 3/4  soft-tissue]
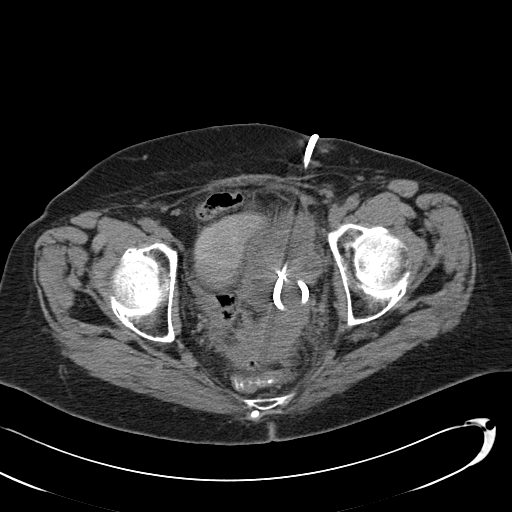

[6 of 16 positions shown; findings below may reference images not displayed]

CT GUIDED LEFT PELVIC DIVERTICULAR ABSCESS 12-FRENCH DRAIN
PLACEMENT

Date:  10/30/2011 [DATE]

Radiologist:  Calen Mekonen, M.D.

Medications:  1 mg Versed, 100 mcg Fentanyl

Guidance:  CT

Fluoroscopy time:  None.

Sedation time:  20 minutes

Contrast volume:  None.

Complications:  No immediate

PROCEDURE/FINDINGS:

Informed consent was obtained from the patient following
explanation of the procedure, risks, benefits and alternatives.
The patient understands, agrees and consents for the procedure.
All questions were addressed.  A time out was performed.

Maximal barrier sterile technique utilized including caps, mask,
sterile gowns, sterile gloves, large sterile drape, hand hygiene,
and betadine

Previous imaging reviewed.  The patient was positioned supine.
Noncontrast localization CT performed through the pelvis.  Complex
left pelvic abscess localized.  Under sterile conditions and local
anesthesia, an 18 gauge access needle was advanced from an anterior
approach into the collection.  Needle position confirmed with CT.
Syringe aspiration yielded purulent fluid.  Sample sent for Gram
stain and culture.  Guide wire advanced followed by tract
dilatation to insert a 12-French drain.  Drain catheter position
confirmed with CT.  Syringe aspiration yielded 120 ml purulent
fluid.  Catheter secured with a Prolene suture and connected to
external suction bulb.  Sterile dressing applied.  No immediate
complication.  The patient tolerated the procedure well.
IMPRESSION: Successful CT guided left lower quadrant pelvic abscess drain
placement.

## 2012-07-23 IMAGING — CT CT ABD-PELV W/ CM
1 of 3 series · 13 of 32 positions shown, 18 images · IV contrast (omnipaque)
Comparison: 11/04/2011 and 11/29/2011

CLINICAL DATA: Pain, diverticulitis

CT ABDOMEN AND PELVIS WITH CONTRAST
TECHNIQUE: Multidetector CT imaging of the abdomen and pelvis was
performed following the standard protocol during bolus
administration of intravenous contrast.
Contrast: 100mL OMNIPAQUE IOHEXOL 300 MG/ML  SOLN

[Series 2: abd/pel with · axial · 0.71mm/px · z∈[+1017,+1382]mm · 13 of 83 slices shown, 18 images]
[im 5/83  soft-tissue]
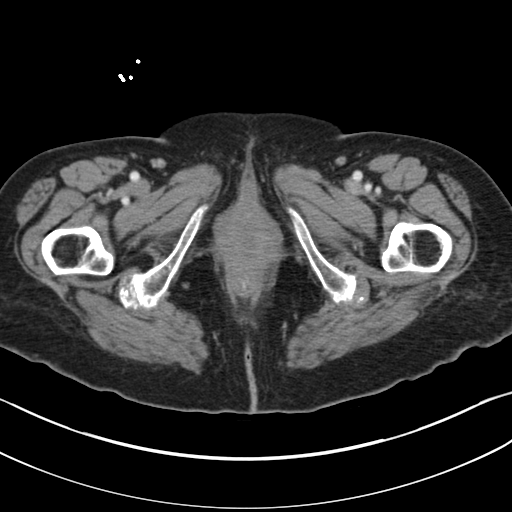
[im 5/83  bone]
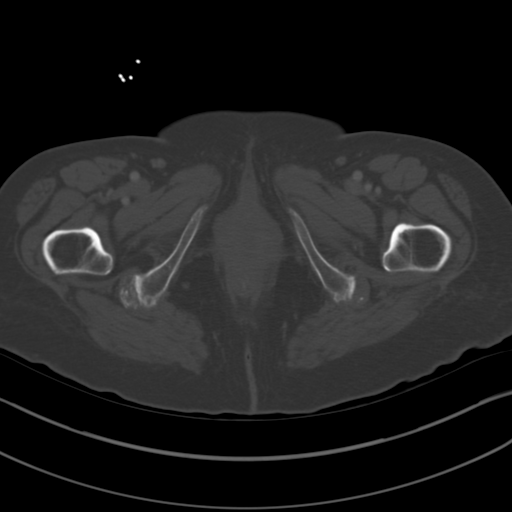
[im 13/83  soft-tissue]
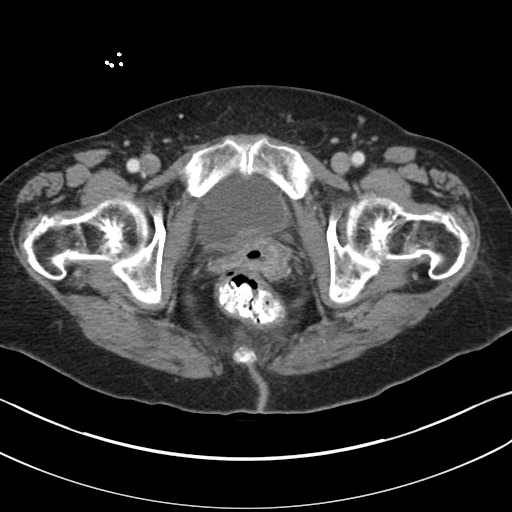
[im 18/83  soft-tissue]
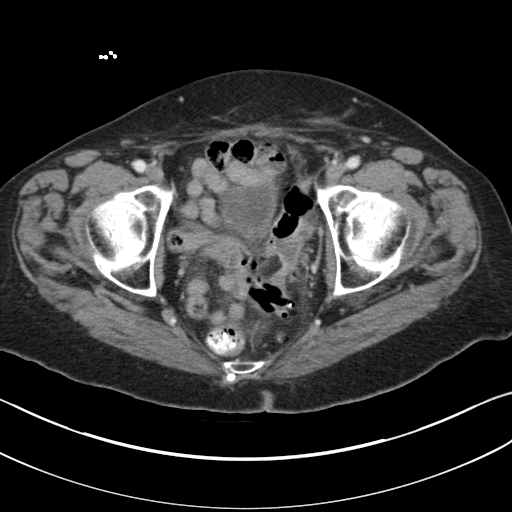
[im 26/83  soft-tissue]
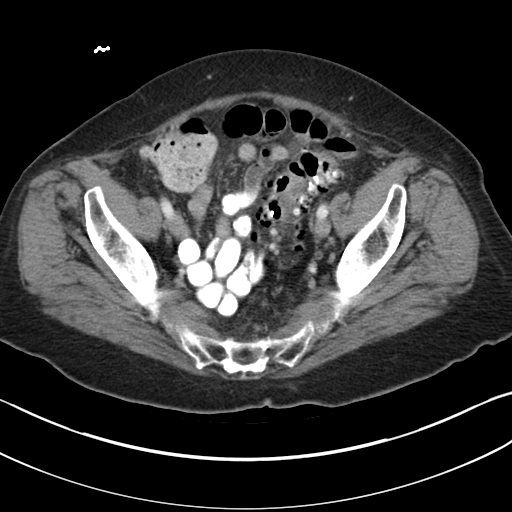
[im 31/83  soft-tissue]
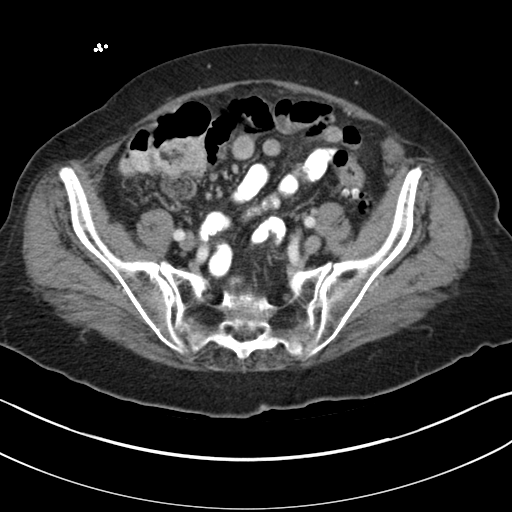
[im 39/83  soft-tissue]
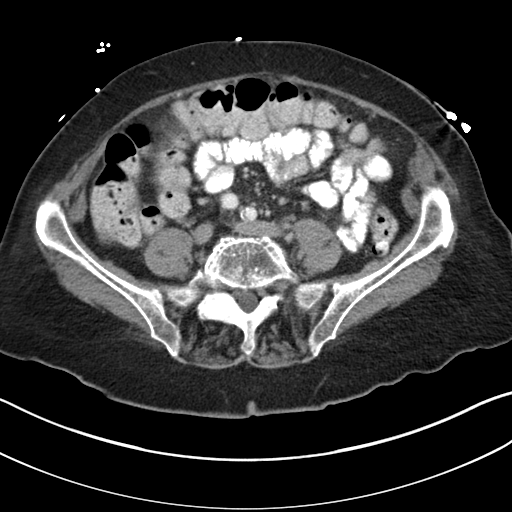
[im 44/83  soft-tissue]
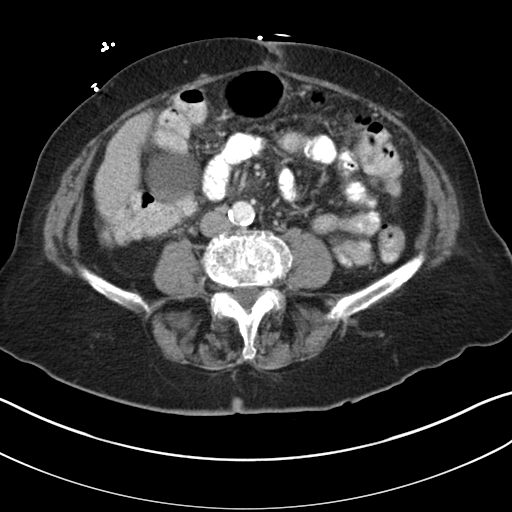
[im 52/83  soft-tissue]
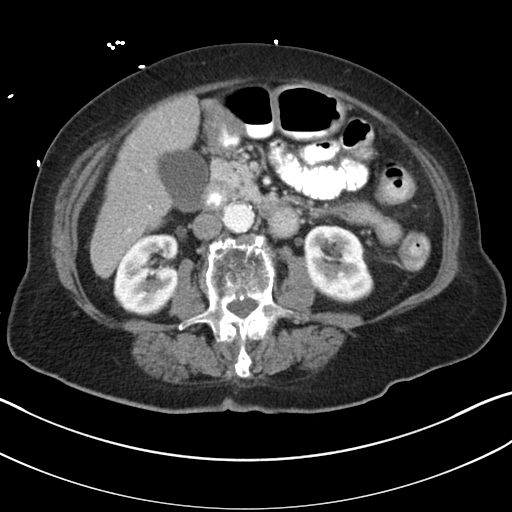
[im 57/83  soft-tissue]
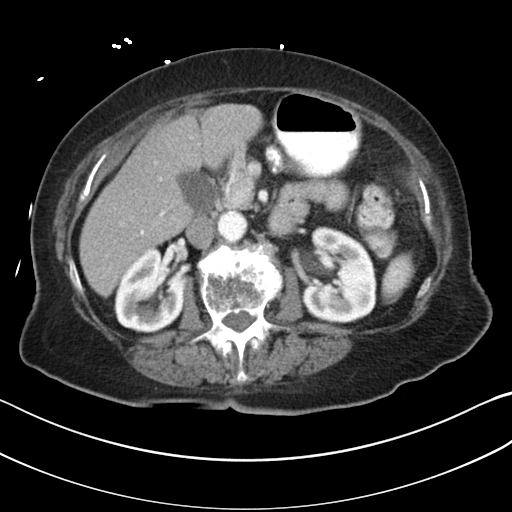
[im 57/83  bone]
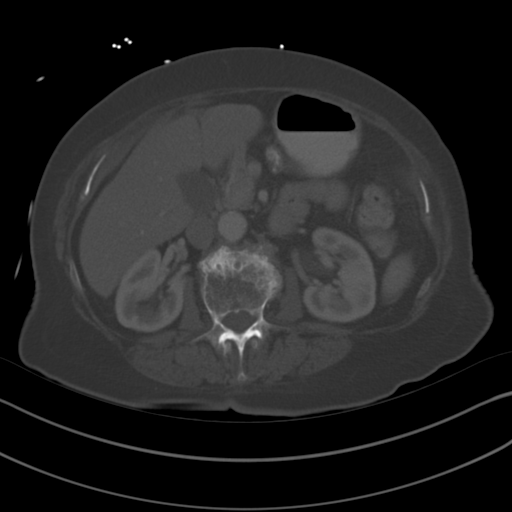
[im 65/83  soft-tissue]
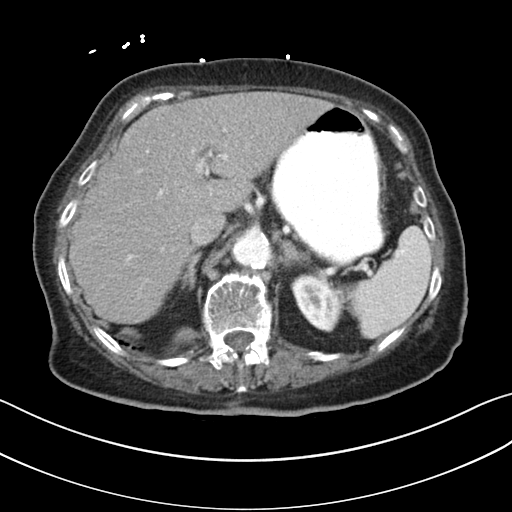
[im 65/83  lung]
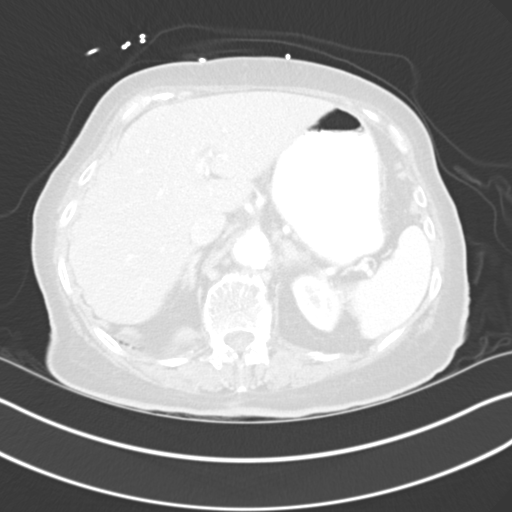
[im 70/83  soft-tissue]
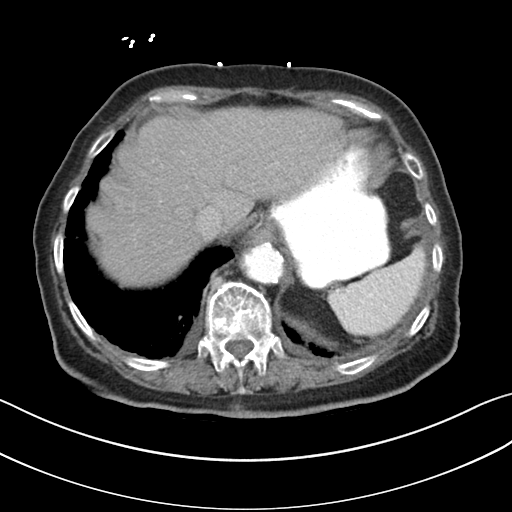
[im 70/83  lung]
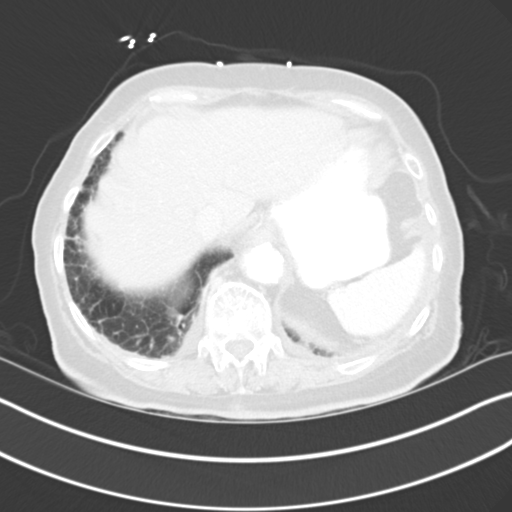
[im 74/83  lung]
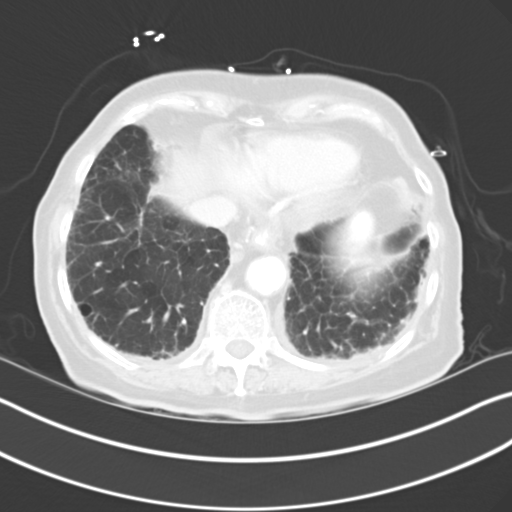
[im 78/83  soft-tissue]
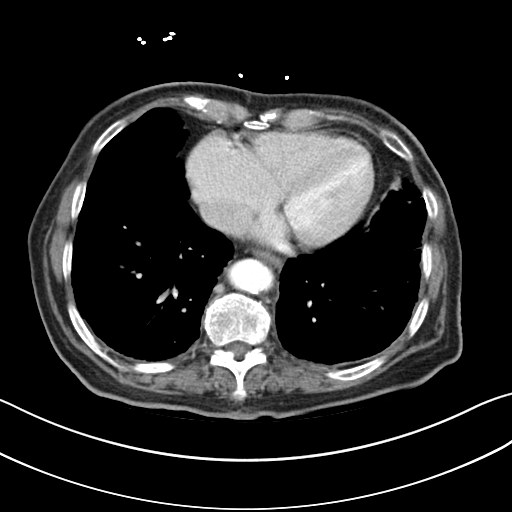
[im 78/83  lung]
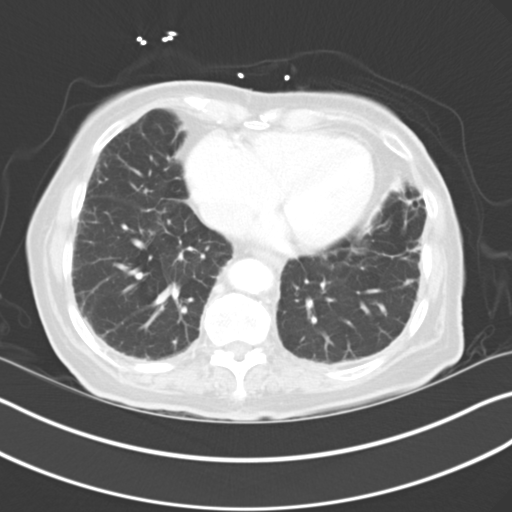

[13 of 32 positions shown; findings below may reference images not displayed]

FINDINGS: Lung bases are unremarkable.

Sagittal images of the spine shows diffuse osteopenia.  Stable
compression deformity is of T12 and L3 vertebral body.

Enhanced liver, spleen, pancreas and adrenals are unremarkable.
Atherosclerotic calcifications of the abdominal aorta and the iliac
arteries again noted.  No calcified gallstones are noted within
gallbladder.

Enhanced kidneys are symmetrical in size.  No hydronephrosis or
hydroureter.

Delayed renal images shows bilateral renal symmetrical excretion.
Again noted multiple sigmoid colon diverticula.  Again noted
loculated fluid collection with some air fluid level is just
posterior to the bladder measures 2.9 x 2.8 cm.  On the prior exam
measures 3 x 3 cm.  This is consistent with probable small residual
abscess.  No new abscess is noted or pericolonic fluid collection.
No  new diverticulitis. Stable small amount of residual fluid in
previous abscess cavity adjacent to sigmoid colon measures 1.7 x
1.8 cm.  On the prior exam measures 4.4 x 1.2 cm.

No extraluminal oral contrast material.  The patient is status post
hysterectomy.  No destructive bony lesions are noted within pelvis.
IMPRESSION: 1. Again noted multiloculated small collection just posterior to
the urinary bladder measures 2.9 x 2.8 cm.  On the prior exam
measures 3 x 3 cm.  This is probable due to a small residual
abscess. Stable small amount of residual fluid in previous abscess
cavity adjacent to sigmoid colon measures 1.7 x 1.8 cm.  On the
prior exam measures 4.4 x 1.2 cm.

2.  No definite evidence of new diverticulitis.
3.  No hydronephrosis or hydroureter.
4.  Status post hysterectomy.
5.  Stable chronic compression deformities of T12 and L3 vertebral
body.

## 2013-05-26 ENCOUNTER — Other Ambulatory Visit (HOSPITAL_BASED_OUTPATIENT_CLINIC_OR_DEPARTMENT_OTHER): Payer: Self-pay | Admitting: Family Medicine

## 2013-05-26 DIAGNOSIS — R109 Unspecified abdominal pain: Secondary | ICD-10-CM

## 2013-05-27 ENCOUNTER — Encounter (HOSPITAL_BASED_OUTPATIENT_CLINIC_OR_DEPARTMENT_OTHER): Payer: Self-pay

## 2013-05-27 ENCOUNTER — Ambulatory Visit (HOSPITAL_BASED_OUTPATIENT_CLINIC_OR_DEPARTMENT_OTHER)
Admission: RE | Admit: 2013-05-27 | Discharge: 2013-05-27 | Disposition: A | Discharge status: Home / Self Care | Payer: Medicare Other | Source: Ambulatory Visit | Attending: Family Medicine | Admitting: Family Medicine

## 2013-05-27 DIAGNOSIS — I714 Abdominal aortic aneurysm, without rupture, unspecified: Secondary | ICD-10-CM | POA: Insufficient documentation

## 2013-05-27 DIAGNOSIS — K449 Diaphragmatic hernia without obstruction or gangrene: Secondary | ICD-10-CM | POA: Insufficient documentation

## 2013-05-27 DIAGNOSIS — R109 Unspecified abdominal pain: Secondary | ICD-10-CM

## 2013-05-27 DIAGNOSIS — I517 Cardiomegaly: Secondary | ICD-10-CM | POA: Insufficient documentation

## 2013-05-27 DIAGNOSIS — K5730 Diverticulosis of large intestine without perforation or abscess without bleeding: Secondary | ICD-10-CM | POA: Insufficient documentation

## 2013-05-27 DIAGNOSIS — I700 Atherosclerosis of aorta: Secondary | ICD-10-CM | POA: Insufficient documentation

## 2013-05-27 DIAGNOSIS — K7689 Other specified diseases of liver: Secondary | ICD-10-CM | POA: Insufficient documentation

## 2013-05-27 DIAGNOSIS — I708 Atherosclerosis of other arteries: Secondary | ICD-10-CM | POA: Insufficient documentation

## 2013-05-27 MED ORDER — IOHEXOL 300 MG/ML  SOLN
100.0000 mL | Freq: Once | INTRAMUSCULAR | Status: AC | PRN
  Administered 2013-05-27: 100 mL via INTRAVENOUS

## 2013-06-05 ENCOUNTER — Emergency Department (HOSPITAL_COMMUNITY): Payer: Medicare Other

## 2013-06-05 ENCOUNTER — Encounter (HOSPITAL_COMMUNITY): Payer: Self-pay | Admitting: Emergency Medicine

## 2013-06-05 ENCOUNTER — Inpatient Hospital Stay (HOSPITAL_COMMUNITY)
Admission: EM | Admit: 2013-06-05 | Discharge: 2013-06-10 | DRG: 470 | Disposition: A | Discharge status: Skilled Nursing Facility | Payer: Medicare Other | Attending: Internal Medicine | Admitting: Internal Medicine

## 2013-06-05 DIAGNOSIS — E039 Hypothyroidism, unspecified: Secondary | ICD-10-CM | POA: Diagnosis present

## 2013-06-05 DIAGNOSIS — E079 Disorder of thyroid, unspecified: Secondary | ICD-10-CM | POA: Diagnosis present

## 2013-06-05 DIAGNOSIS — M129 Arthropathy, unspecified: Secondary | ICD-10-CM | POA: Diagnosis present

## 2013-06-05 DIAGNOSIS — E785 Hyperlipidemia, unspecified: Secondary | ICD-10-CM | POA: Diagnosis present

## 2013-06-05 DIAGNOSIS — G609 Hereditary and idiopathic neuropathy, unspecified: Secondary | ICD-10-CM | POA: Diagnosis present

## 2013-06-05 DIAGNOSIS — Z22322 Carrier or suspected carrier of Methicillin resistant Staphylococcus aureus: Secondary | ICD-10-CM

## 2013-06-05 DIAGNOSIS — Z66 Do not resuscitate: Secondary | ICD-10-CM | POA: Diagnosis present

## 2013-06-05 DIAGNOSIS — Z8249 Family history of ischemic heart disease and other diseases of the circulatory system: Secondary | ICD-10-CM

## 2013-06-05 DIAGNOSIS — Z6825 Body mass index (BMI) 25.0-25.9, adult: Secondary | ICD-10-CM

## 2013-06-05 DIAGNOSIS — M810 Age-related osteoporosis without current pathological fracture: Secondary | ICD-10-CM | POA: Diagnosis present

## 2013-06-05 DIAGNOSIS — I10 Essential (primary) hypertension: Secondary | ICD-10-CM | POA: Diagnosis present

## 2013-06-05 DIAGNOSIS — J449 Chronic obstructive pulmonary disease, unspecified: Secondary | ICD-10-CM | POA: Diagnosis present

## 2013-06-05 DIAGNOSIS — S72002S Fracture of unspecified part of neck of left femur, sequela: Secondary | ICD-10-CM

## 2013-06-05 DIAGNOSIS — S72009A Fracture of unspecified part of neck of unspecified femur, initial encounter for closed fracture: Principal | ICD-10-CM | POA: Diagnosis present

## 2013-06-05 DIAGNOSIS — S72002A Fracture of unspecified part of neck of left femur, initial encounter for closed fracture: Secondary | ICD-10-CM

## 2013-06-05 DIAGNOSIS — J4489 Other specified chronic obstructive pulmonary disease: Secondary | ICD-10-CM | POA: Diagnosis present

## 2013-06-05 DIAGNOSIS — Z823 Family history of stroke: Secondary | ICD-10-CM

## 2013-06-05 DIAGNOSIS — G608 Other hereditary and idiopathic neuropathies: Secondary | ICD-10-CM | POA: Diagnosis present

## 2013-06-05 DIAGNOSIS — N390 Urinary tract infection, site not specified: Secondary | ICD-10-CM | POA: Diagnosis present

## 2013-06-05 DIAGNOSIS — Y92009 Unspecified place in unspecified non-institutional (private) residence as the place of occurrence of the external cause: Secondary | ICD-10-CM

## 2013-06-05 DIAGNOSIS — W108XXA Fall (on) (from) other stairs and steps, initial encounter: Secondary | ICD-10-CM | POA: Diagnosis present

## 2013-06-05 HISTORY — DX: Pure hypercholesterolemia, unspecified: E78.00

## 2013-06-05 HISTORY — DX: Hypothyroidism, unspecified: E03.9

## 2013-06-05 LAB — COMPREHENSIVE METABOLIC PANEL
ALT: 23 U/L (ref 0–35)
AST: 29 U/L (ref 0–37)
Alkaline Phosphatase: 78 U/L (ref 39–117)
CO2: 24 mEq/L (ref 19–32)
Calcium: 9.3 mg/dL (ref 8.4–10.5)
Chloride: 98 mEq/L (ref 96–112)
GFR calc Af Amer: 84 mL/min — ABNORMAL LOW (ref >90)
GFR calc non Af Amer: 73 mL/min — ABNORMAL LOW (ref >90)
Glucose, Bld: 126 mg/dL — ABNORMAL HIGH (ref 70–99)
Potassium: 4.2 mEq/L (ref 3.5–5.1)
Sodium: 136 mEq/L (ref 135–145)
Total Bilirubin: 0.5 mg/dL (ref 0.3–1.2)

## 2013-06-05 LAB — URINALYSIS, ROUTINE W REFLEX MICROSCOPIC
Glucose, UA: NEGATIVE mg/dL
Hgb urine dipstick: NEGATIVE
Specific Gravity, Urine: 1.018 (ref 1.005–1.030)
pH: 7.5 (ref 5.0–8.0)

## 2013-06-05 LAB — URINE MICROSCOPIC-ADD ON

## 2013-06-05 LAB — CBC
Hemoglobin: 14.2 g/dL (ref 12.0–15.0)
MCH: 30.8 pg (ref 26.0–34.0)
RBC: 4.61 MIL/uL (ref 3.87–5.11)
WBC: 10.2 10*3/uL (ref 4.0–10.5)

## 2013-06-05 LAB — ABO/RH: ABO/RH(D): A POS

## 2013-06-05 MED ORDER — TIOTROPIUM BROMIDE MONOHYDRATE 18 MCG IN CAPS
18.0000 ug | ORAL_CAPSULE | Freq: Every day | RESPIRATORY_TRACT | Status: DC
  Administered 2013-06-06 – 2013-06-10 (×4): 18 ug via RESPIRATORY_TRACT
  Filled 2013-06-05: qty 5

## 2013-06-05 MED ORDER — ONDANSETRON HCL 4 MG/2ML IJ SOLN: 4.0000 mg | Freq: Three times a day (TID) | INTRAMUSCULAR | Status: AC | PRN

## 2013-06-05 MED ORDER — HYDROMORPHONE HCL PF 1 MG/ML IJ SOLN: 0.5000 mg | Freq: Once | INTRAMUSCULAR | Status: AC

## 2013-06-05 MED ORDER — HYDROMORPHONE HCL PF 1 MG/ML IJ SOLN
0.5000 mg | INTRAMUSCULAR | Status: DC | PRN
  Administered 2013-06-05 – 2013-06-09 (×6): 0.5 mg via INTRAVENOUS
  Filled 2013-06-05 (×7): qty 1

## 2013-06-05 MED ORDER — HYDROMORPHONE HCL PF 1 MG/ML IJ SOLN
0.5000 mg | Freq: Once | INTRAMUSCULAR | Status: AC
  Administered 2013-06-05: 0.5 mg via INTRAVENOUS
  Filled 2013-06-05: qty 1

## 2013-06-05 MED ORDER — SODIUM CHLORIDE 0.9 % IV SOLN
INTRAVENOUS | Status: AC
  Administered 2013-06-05: 21:00:00 via INTRAVENOUS

## 2013-06-05 MED ORDER — ONDANSETRON HCL 4 MG/2ML IJ SOLN: 4.0000 mg | Freq: Once | INTRAMUSCULAR | Status: AC

## 2013-06-05 MED ORDER — HYDROMORPHONE HCL PF 1 MG/ML IJ SOLN
0.5000 mg | Freq: Once | INTRAMUSCULAR | Status: AC
  Administered 2013-06-05: 0.5 mg via INTRAVENOUS

## 2013-06-05 MED ORDER — DEXTROSE 5 % IV SOLN
1.0000 g | INTRAVENOUS | Status: DC
  Administered 2013-06-05 – 2013-06-08 (×4): 1 g via INTRAVENOUS
  Filled 2013-06-05 (×5): qty 10

## 2013-06-05 MED ORDER — HYDROCODONE-ACETAMINOPHEN 5-325 MG PO TABS
1.0000 | ORAL_TABLET | ORAL | Status: AC | PRN
  Administered 2013-06-06: 1 via ORAL
  Administered 2013-06-06: 2 via ORAL
  Filled 2013-06-05: qty 2
  Filled 2013-06-05: qty 1
  Filled 2013-06-05: qty 2

## 2013-06-05 MED ORDER — METHOCARBAMOL 500 MG PO TABS
500.0000 mg | ORAL_TABLET | Freq: Four times a day (QID) | ORAL | Status: DC | PRN
  Administered 2013-06-06: 500 mg via ORAL
  Filled 2013-06-05: qty 1

## 2013-06-05 MED ORDER — METOCLOPRAMIDE HCL 5 MG/ML IJ SOLN
10.0000 mg | Freq: Three times a day (TID) | INTRAMUSCULAR | Status: DC | PRN
  Administered 2013-06-05 – 2013-06-06 (×2): 10 mg via INTRAVENOUS
  Filled 2013-06-05 (×2): qty 2

## 2013-06-05 MED ORDER — LEVOTHYROXINE SODIUM 88 MCG PO TABS
88.0000 ug | ORAL_TABLET | Freq: Every day | ORAL | Status: DC
  Administered 2013-06-06 – 2013-06-10 (×4): 88 ug via ORAL
  Filled 2013-06-05 (×6): qty 1

## 2013-06-05 MED ORDER — DOCUSATE SODIUM 100 MG PO CAPS
100.0000 mg | ORAL_CAPSULE | Freq: Two times a day (BID) | ORAL | Status: DC
  Administered 2013-06-06 – 2013-06-10 (×6): 100 mg via ORAL

## 2013-06-05 MED ORDER — ONDANSETRON HCL 4 MG/2ML IJ SOLN
4.0000 mg | Freq: Once | INTRAMUSCULAR | Status: AC
  Administered 2013-06-05: 4 mg via INTRAVENOUS
  Filled 2013-06-05: qty 2

## 2013-06-05 MED ORDER — ALBUTEROL SULFATE (5 MG/ML) 0.5% IN NEBU: 2.5000 mg | INHALATION_SOLUTION | RESPIRATORY_TRACT | Status: DC | PRN

## 2013-06-05 MED ORDER — METHOCARBAMOL 100 MG/ML IJ SOLN
500.0000 mg | Freq: Four times a day (QID) | INTRAVENOUS | Status: DC | PRN
  Administered 2013-06-05: 500 mg via INTRAVENOUS
  Filled 2013-06-05 (×2): qty 5

## 2013-06-05 MED ORDER — METOPROLOL SUCCINATE ER 50 MG PO TB24
50.0000 mg | ORAL_TABLET | Freq: Every day | ORAL | Status: DC
  Administered 2013-06-06 – 2013-06-10 (×5): 50 mg via ORAL
  Filled 2013-06-05 (×5): qty 1

## 2013-06-05 NOTE — ED Notes (Signed)
Patient transported to X-ray 

## 2013-06-05 NOTE — H&P (Signed)
PCP:  Joycelyn Rua, MD [facility] Dr. Priscille Kluver who is currently retired at this point he would like to have Dr. Berton Lan  Chief Complaint:  Mikayla Villanueva   HPI: Mikayla Villanueva is a 79 y.o. female   has a past medical history of Thyroid disease; Hypertension; Hyperlipemia; Diverticulitis; Arthritis; Osteoporosis; Hypothyroidism; and Hypercholesteremia.   Presented with  Family states she fell while getting things from the basement. She miss-stepped on a stair and fell on to concrete floor landing on her left hip. She was not able to get up her family was there and called 911. Patient was brought to ER  Her past hx significant for two episodes of severe diverticulitis and abscesses needing drain placement.  At her baseline she walks slow but able to ambulate well. No hx of exercise induced chest pain or shortness of breath.  She thinks she had a stress test in the past and it was normal that was many years ago.  Patient has been told that she has " diabetic neuropathy" but she was never diagnosed with diabetes in her blood sugar has never been elevated.    Review of Systems:    Pertinent positives include: join aches, headache which is chronic. dysuria,  Constitutional:  No weight loss, night sweats, Fevers, chills, fatigue, weight loss  HEENT:   Difficulty swallowing,Tooth/dental problems,Sore throat,  No sneezing, itching, ear ache, nasal congestion, post nasal drip,  Cardio-vascular:  No chest pain, Orthopnea, PND, anasarca, dizziness, palpitations.no Bilateral lower extremity swelling  GI:  No heartburn, indigestion, abdominal pain, nausea, vomiting, diarrhea, change in bowel habits, loss of appetite, melena, blood in stool, hematemesis Resp:  no shortness of breath at rest. No dyspnea on exertion, No excess mucus, no productive cough, No non-productive cough, No coughing up of blood.No change in color of mucus.No wheezing. Skin:  no rash or lesions. No jaundice GU:  no  change  in color of urine, no urgency or frequency. No straining to urinate.  No flank pain.  Musculoskeletal:    no joint swelling. No decreased range of motion  Psych:  No change in mood or affect. No depression or anxiety. No memory loss.  Neuro: no localizing neurological complaints, no tingling, no weakness, no double vision, no gait abnormality, no slurred speech, no confusion  Otherwise ROS are negative except for above, 10 systems were reviewed  Past Medical History: Past Medical History  Diagnosis Date  . Thyroid disease   . Hypertension   . Hyperlipemia   . Diverticulitis   . Arthritis   . Osteoporosis   . Hypothyroidism   . Hypercholesteremia    Past Surgical History  Procedure Laterality Date  . Back surgery    . Abdominal hysterectomy    . Ankle surgery    . Eye surgery    . Broken wrist Left     left, Dr. Cleophas Dunker,     Medications: Prior to Admission medications   Medication Sig Start Date End Date Taking? Authorizing Provider  acetaminophen (TYLENOL) 500 MG tablet Take 1,000 mg by mouth 2 (two) times daily as needed. For pain   Yes Historical Provider, MD  b complex vitamins capsule Take 1 capsule by mouth daily.   Yes Historical Provider, MD  fish oil-omega-3 fatty acids 1000 MG capsule Take 1 g by mouth 2 (two) times daily.    Yes Historical Provider, MD  levothyroxine (SYNTHROID, LEVOTHROID) 88 MCG tablet Take 88 mcg by mouth daily.   Yes Historical Provider, MD  lisinopril (  PRINIVIL,ZESTRIL) 20 MG tablet Take 20 mg by mouth daily.   Yes Historical Provider, MD  meloxicam (MOBIC) 7.5 MG tablet Take 7.5 mg by mouth daily as needed for pain.    Yes Historical Provider, MD  metoprolol succinate (TOPROL-XL) 50 MG 24 hr tablet Take 50 mg by mouth daily. Take with or immediately following a meal.   Yes Historical Provider, MD  simvastatin (ZOCOR) 40 MG tablet Take 40 mg by mouth every evening.   Yes Historical Provider, MD  tiotropium (SPIRIVA) 18 MCG inhalation  capsule Place 18 mcg into inhaler and inhale daily.   Yes Historical Provider, MD  traMADol (ULTRAM) 50 MG tablet Take 25 mg by mouth every 12 (twelve) hours as needed for moderate pain.   Yes Historical Provider, MD    Allergies:   Allergies  Allergen Reactions  . Benadryl [Diphenhydramine]     hallicuinates  . Codeine     hallucinates  . Morphine And Related     hallucinates  . Sulfa Antibiotics Hives  . Nitrofurantoin Monohyd Macro Hives    Social History:  Ambulatory cane Lives at   Home alone but daughter lives next door [facility]  [facility] [place] [place] [phone]             [name] [phone]   reports that she has never smoked. She has never used smokeless tobacco. She reports that she does not drink alcohol or use illicit drugs.   Family History: family history includes Heart disease in her mother; Stroke in her father.    Physical Exam: Patient Vitals for the past 24 hrs:  BP Temp Temp src Pulse Resp SpO2  06/05/13 1928 145/68 mmHg 98.2 F (36.8 C) Oral 84 18 96 %  06/05/13 1705 167/85 mmHg 97.8 F (36.6 C) Oral 93 18 90 %    1. General:  in No Acute distress 2. Psychological: Alert and  Oriented 3. Head/ENT:     Dry Mucous Membranes                          Head Non traumatic, neck supple                          Normal  Dentition 4. SKIN:  decreased Skin turgor,  Skin clean Dry and intact no rash 5. Heart: Irregular rate and rhythm no Murmur, Rub or gallop 6. Lungs: Clear to auscultation bilaterally, no wheezes or crackles   7. Abdomen: Soft, non-tender, Non distended 8. Lower extremities: no clubbing, cyanosis, or edema left lower extremity shortened 9. Neurologically Grossly intact, moving all 4 extremities equally 10. MSK: Normal range of motion except for left hip  body mass index is unknown because there is no weight on file.   Labs on Admission:   Recent Labs  06/05/13 1648  NA 136  K 4.2  CL 98  CO2 24  GLUCOSE 126*  BUN  34*  CREATININE 0.75  CALCIUM 9.3    Recent Labs  06/05/13 1648  AST 29  ALT 23  ALKPHOS 78  BILITOT 0.5  PROT 8.1  ALBUMIN 4.4   No results found for this basename: LIPASE, AMYLASE,  in the last 72 hours  Recent Labs  06/05/13 1648  WBC 10.2  HGB 14.2  HCT 43.7  MCV 94.8  PLT 243   No results found for this basename: CKTOTAL, CKMB, CKMBINDEX, TROPONINI,  in the last  72 hours No results found for this basename: TSH, T4TOTAL, FREET3, T3FREE, THYROIDAB,  in the last 72 hours No results found for this basename: VITAMINB12, FOLATE, FERRITIN, TIBC, IRON, RETICCTPCT,  in the last 72 hours No results found for this basename: HGBA1C    The CrCl is unknown because both a height and weight (above a minimum accepted value) are required for this calculation. ABG No results found for this basename: phart, pco2, po2, hco3, tco2, acidbasedef, o2sat     No results found for this basename: DDIMER     Other results:  I have pearsonaly reviewed this: ECG REPORT  Rate: 88  Rhythm: NSR with PVC's ST&T Change: no ischemic changes  UA WBC 7-10   Cultures:    Component Value Date/Time   SDES DRAINAGE 12/04/2011 1330   SPECREQUEST Normal 12/04/2011 1330   CULT MODERATE ENTEROCOCCUS SPECIES 12/04/2011 1330   REPTSTATUS 12/07/2011 FINAL 12/04/2011 1330       Radiological Exams on Admission: Dg Chest 1 View  06/05/2013   CLINICAL DATA:  Fall, hip injury, preop.  EXAM: CHEST - 1 VIEW  COMPARISON:  06/05/2012  FINDINGS: Cardiomegaly. Diffuse interstitial prominence throughout the lungs. Peribronchial thickening. No effusions. No acute bony abnormality.  IMPRESSION: Cardiomegaly. Peribronchial thickening and interstitial prominence. While this could reflect development of chronic interstitial lung disease, I cannot exclude interstitial edema.   Electronically Signed   By: Charlett Nose M.D.   On: 06/05/2013 18:33   Dg Hip Complete Left  06/05/2013   CLINICAL DATA:  Fall, left hip  pain.  EXAM: LEFT HIP - COMPLETE 2+ VIEW  COMPARISON:  None.  FINDINGS: There is a left femoral neck fracture with varus angulation and mild impaction. No subluxation or dislocation. Mild symmetric degenerative changes in the hips bilaterally. Contrast material noted within the left colonic diverticula.  IMPRESSION: Left femoral neck fracture with varus angulation and impaction.   Electronically Signed   By: Charlett Nose M.D.   On: 06/05/2013 18:31    Chart has been reviewed  Assessment/Plan  79 year old female with mechanical fall resulting in left femoral neck fracture admitted for hip replacement  Present on Admission:  Left hip fracture  - orthopedics is aware and will see her in consult. Anticipate OR time in the morning. Will make patient n.p.o. post midnight except for meds.  continue her metoprolol. Given advanced age patient has moderate risk but given no known history of coronary disease and good exercise tolerance no further cardiac workup is indicated prior to proceeding to OR.  Marland Kitchen Hypertension - hold lisinopril continue metoprolol  . COPD (chronic obstructive pulmonary disease) - this is secondary to secondhand smoke. Will continue Spiriva and when necessary albuterol  . UTI (lower urinary tract infection) -mild will await results of urine culture for now treat with Rocephin   Prophylaxis: SCD    CODE STATUS:DNR/DNI as per patient's wishes  Other plan as per orders.  I have spent a total of 65 min on this admission -time taken to discuss care also with orthopedics  Kaylib Furness 06/05/2013, 7:55 PM

## 2013-06-05 NOTE — ED Provider Notes (Addendum)
CSN: [id number]     Arrival date & time 06/05/13  1637 History   First MD Initiated Contact with Patient 06/05/13 1643     Chief Complaint  Patient presents with  . Hip Injury  . Fall   (Consider location/radiation/quality/duration/timing/severity/associated sxs/prior Treatment) Patient is a 79 y.o. female presenting with fall. The history is provided by the patient and the EMS personnel.  Fall Pertinent negatives include no chest pain, no abdominal pain, no headaches and no shortness of breath.  pt s/p trip and fall at home just pta today. Pt was going up steps, states foot wouldn't slid fully onto step, and fell backwards one step landing on basement floor. C/o left hip pain, constant, mod-sever, worse w movement, non radiating. No associated numbness/weakness. States no faintness or dizziness prior to fall.  Denies hitting head or loc. No headache. No neck or back pain.      Past Medical History  Diagnosis Date  . Thyroid disease   . Hypertension   . Hyperlipemia   . Diverticulitis   . Arthritis   . Osteoporosis    Past Surgical History  Procedure Laterality Date  . Back surgery    . Abdominal hysterectomy    . Ankle surgery    . Eye surgery    . Broken wrist      left   Family History  Problem Relation Age of Onset  . Heart disease Mother   . Stroke Father    History  Substance Use Topics  . Smoking status: Never Smoker   . Smokeless tobacco: Never Used  . Alcohol Use: No   OB History   Grav Para Term Preterm Abortions TAB SAB Ect Mult Living                 Review of Systems  Constitutional: Negative for fever and chills.  HENT: Negative for sore throat.   Eyes: Negative for visual disturbance.  Respiratory: Negative for shortness of breath.   Cardiovascular: Negative for chest pain.  Gastrointestinal: Negative for vomiting and abdominal pain.  Genitourinary: Negative for flank pain.  Musculoskeletal: Negative for back pain and neck pain.  Skin:  Negative for rash.  Neurological: Negative for weakness, numbness and headaches.  Hematological: Does not bruise/bleed easily.  Psychiatric/Behavioral: Negative for confusion.    Allergies  Benadryl; Codeine; Morphine and related; Sulfa antibiotics; and Nitrofurantoin monohyd macro  Home Medications   Current Outpatient Rx  Name  Route  Sig  Dispense  Refill  . acetaminophen (TYLENOL) 500 MG tablet   Oral   Take 1,000 mg by mouth 2 (two) times daily as needed. For pain         . b complex vitamins capsule   Oral   Take 1 capsule by mouth daily.         . fish oil-omega-3 fatty acids 1000 MG capsule   Oral   Take 1 g by mouth 2 (two) times daily.          Marland Kitchen levothyroxine (SYNTHROID, LEVOTHROID) 88 MCG tablet   Oral   Take 88 mcg by mouth daily.         Marland Kitchen lisinopril (PRINIVIL,ZESTRIL) 20 MG tablet   Oral   Take 20 mg by mouth daily.         . meloxicam (MOBIC) 7.5 MG tablet   Oral   Take 7.5 mg by mouth daily.         . metoprolol tartrate (LOPRESSOR) 25 MG tablet  Oral   Take 25 mg by mouth daily.         . simvastatin (ZOCOR) 40 MG tablet   Oral   Take 40 mg by mouth every evening.          BP 167/85  Pulse 93  Temp(Src) 97.8 F (36.6 C) (Oral)  Resp 18  SpO2 90% Physical Exam  Nursing note and vitals reviewed. Constitutional: She is oriented to person, place, and time. She appears well-developed and well-nourished. No distress.  HENT:  Head: Atraumatic.  Eyes: Conjunctivae are normal. Pupils are equal, round, and reactive to light. No scleral icterus.  Neck: Normal range of motion. Neck supple. No tracheal deviation present.  Cardiovascular: Normal rate, regular rhythm, normal heart sounds and intact distal pulses.   Pulmonary/Chest: Effort normal and breath sounds normal. No respiratory distress. She exhibits no tenderness.  Abdominal: Soft. Normal appearance. She exhibits no distension. There is no tenderness.  Musculoskeletal: She  exhibits no edema.  CTLS spine, non tender, aligned, no step off. Tenderness left hip. Distal pulses palp bil. No other focal bony tenderness noted.   Neurological: She is alert and oriented to person, place, and time.  Motor intact bil.   Skin: Skin is warm and dry. No rash noted. She is not diaphoretic.  Psychiatric: She has a normal mood and affect.    ED Course  Procedures (including critical care time)  EKG Interpretation    Date/Time:  Friday June 05 2013 17:15:11 EST Ventricular Rate:  88 PR Interval:  184 QRS Duration: 101 QT Interval:  396 QTC Calculation: 479 R Axis:   27 Text Interpretation:  Sinus rhythm Multiple ventricular premature complexes Left atrial enlargement Confirmed by Denton Lank  MD, Labron Bloodgood ([id number]) on 06/05/2013 7:09:41 PM           Results for orders placed during the hospital encounter of 06/05/13  CBC      Result Value Range   WBC 10.2  4.0 - 10.5 K/uL   RBC 4.61  3.87 - 5.11 MIL/uL   Hemoglobin 14.2  12.0 - 15.0 g/dL   HCT 78.4  69.6 - 29.5 %   MCV 94.8  78.0 - 100.0 fL   MCH 30.8  26.0 - 34.0 pg   MCHC 32.5  30.0 - 36.0 g/dL   RDW 28.4  13.2 - 44.0 %   Platelets 243  150 - 400 K/uL  COMPREHENSIVE METABOLIC PANEL      Result Value Range   Sodium 136  135 - 145 mEq/L   Potassium 4.2  3.5 - 5.1 mEq/L   Chloride 98  96 - 112 mEq/L   CO2 24  19 - 32 mEq/L   Glucose, Bld 126 (*) 70 - 99 mg/dL   BUN 34 (*) 6 - 23 mg/dL   Creatinine, Ser 1.02  0.50 - 1.10 mg/dL   Calcium 9.3  8.4 - 72.5 mg/dL   Total Protein 8.1  6.0 - 8.3 g/dL   Albumin 4.4  3.5 - 5.2 g/dL   AST 29  0 - 37 U/L   ALT 23  0 - 35 U/L   Alkaline Phosphatase 78  39 - 117 U/L   Total Bilirubin 0.5  0.3 - 1.2 mg/dL   GFR calc non Af Amer 73 (*) >90 mL/min   GFR calc Af Amer 84 (*) >90 mL/min  PROTIME-INR      Result Value Range   Prothrombin Time 13.3  11.6 - 15.2 seconds   INR  1.03  0.00 - 1.49  URINALYSIS, ROUTINE W REFLEX MICROSCOPIC      Result Value Range   Color,  Urine YELLOW  YELLOW   APPearance CLEAR  CLEAR   Specific Gravity, Urine 1.018  1.005 - 1.030   pH 7.5  5.0 - 8.0   Glucose, UA NEGATIVE  NEGATIVE mg/dL   Hgb urine dipstick NEGATIVE  NEGATIVE   Bilirubin Urine NEGATIVE  NEGATIVE   Ketones, ur NEGATIVE  NEGATIVE mg/dL   Protein, ur NEGATIVE  NEGATIVE mg/dL   Urobilinogen, UA 0.2  0.0 - 1.0 mg/dL   Nitrite NEGATIVE  NEGATIVE   Leukocytes, UA SMALL (*) NEGATIVE  URINE MICROSCOPIC-ADD ON      Result Value Range   Squamous Epithelial / LPF RARE  RARE   WBC, UA 7-10  <3 WBC/hpf   Bacteria, UA RARE  RARE   Dg Chest 1 View  06/05/2013   CLINICAL DATA:  Fall, hip injury, preop.  EXAM: CHEST - 1 VIEW  COMPARISON:  06/05/2012  FINDINGS: Cardiomegaly. Diffuse interstitial prominence throughout the lungs. Peribronchial thickening. No effusions. No acute bony abnormality.  IMPRESSION: Cardiomegaly. Peribronchial thickening and interstitial prominence. While this could reflect development of chronic interstitial lung disease, I cannot exclude interstitial edema.   Electronically Signed   By: Charlett Nose M.D.   On: 06/05/2013 18:33   Dg Hip Complete Left  06/05/2013   CLINICAL DATA:  Fall, left hip pain.  EXAM: LEFT HIP - COMPLETE 2+ VIEW  COMPARISON:  None.  FINDINGS: There is a left femoral neck fracture with varus angulation and mild impaction. No subluxation or dislocation. Mild symmetric degenerative changes in the hips bilaterally. Contrast material noted within the left colonic diverticula.  IMPRESSION: Left femoral neck fracture with varus angulation and impaction.   Electronically Signed   By: Charlett Nose M.D.   On: 06/05/2013 18:31      MDM  Iv ns. Dilaudid iv. zofran iv. Xr.  Labs.  Reviewed nursing notes and prior charts for additional history.   Ortho called.  hospitalists called.  Recheck pain better but persists. Dilaudid iv.  Discussed xrays and plan w pt and family.         Suzi Roots, MD 06/05/13 1910

## 2013-06-05 NOTE — ED Notes (Signed)
Nurse not available to take report. Will recall report soon. Contact # left for nurse to call back. 

## 2013-06-05 NOTE — ED Notes (Addendum)
Per EMS patient from home for trip and fall, landed on left hip, obvious deformity present. No LOC, denies head injury, patient is alert and oriented at baseline per EMS. Pt c/o left hip and right foot pain.

## 2013-06-05 NOTE — Consult Note (Signed)
Reason for Consult: Left Hip Fracture Referring Physician:  Dr. Anna Genre Mikayla is an 79 y.o. female.  HPI: Patient is an 79 year old female who came into the ED today at [facility] for injuries sustained during fall at home early today around 2:00 PM as per the patient. She lives by herself,but her daughter Mikayla Villanueva lie next door and checks on her regularly.  She has been ambulating with a cane since 2012 following two bouts of diverticulitis. She hd two different hospital stays for the two bouts and apparently was ery deconditioned requiring a walker for mobility and then eventually working back to a cane. She has been on the cane ever since. The patient was in her basement and was starting to go up her stairs when the fall occurred.  She went to step up on the first step and lost her balance falling backward onto the cement basement floor. She had immediate pain and was transported to [facility] where x-rays proved positive for a displaced femoral neck fracture. They have been a patient of Mikayla Villanueva in the past have also been treated by Mikayla Villanueva.  She will admitted by medicine service and Mikayla Villanueva to consult for the displaced hip fracture.  Past Medical History  Diagnosis Date  . Thyroid disease   . Hypertension   . Hyperlipemia   . Diverticulitis   . Arthritis   . Osteoporosis   . Hypothyroidism   . Hypercholesteremia     Past Surgical History  Procedure Laterality Date  . Back surgery    . Abdominal hysterectomy    . Ankle surgery    . Eye surgery    . Broken wrist Left     left, Dr. Cleophas Dunker,    Family History  Problem Relation Age of Onset  . Heart disease Mother   . Stroke Father     Social History:  reports that she has never smoked. She has never used smokeless tobacco. She reports that she does not drink alcohol or use illicit drugs.  She lives alone but her daughter, Mikayla Villanueva live next door.  Allergies:  Allergies  Allergen Reactions  .  Benadryl [Diphenhydramine]     hallicuinates  . Codeine     hallucinates  . Morphine And Related     hallucinates  . Sulfa Antibiotics Hives  . Nitrofurantoin Monohyd Macro Hives    Medications:  acetaminophen (TYLENOL) 500 MG tablet Take 1,000 mg by mouth 2 (two) times daily as needed b complex vitamins capsule Take 1 capsule by mouth daily fish oil-omega-3 fatty acids 1000 MG capsule Take 1 g by mouth 2 (two) times daily levothyroxine (SYNTHROID, LEVOTHROID) 88 MCG tablet Take 88 mcg by mouth daily  lisinopril (PRINIVIL,ZESTRIL) 20 MG tablet Take 20 mg by mouth daily. meloxicam (MOBIC) 7.5 MG tablet Take 7.5 mg by mouth daily as needed for pain metoprolol succinate (TOPROL-XL) 50 MG 24 hr tablet Take 50 mg by mouth daily simvastatin (ZOCOR) 40 MG tablet Take 40 mg by mouth every evening tiotropium (SPIRIVA) 18 MCG inhalation capsule Place 18 mcg into inhaler and inhale daily traMADol (ULTRAM) 50 MG tablet Take 25 mg by mouth every 12 (twelve) hours as needed for moderate pain.   Results for orders placed during the hospital encounter of 06/05/13 (from the past 48 hour(s))  CBC     Status: None   Collection Time    06/05/13  4:48 PM      Result Value Range  WBC 10.2  4.0 - 10.5 K/uL   RBC 4.61  3.87 - 5.11 MIL/uL   Hemoglobin 14.2  12.0 - 15.0 g/dL   HCT 16.1  09.6 - 04.5 %   MCV 94.8  78.0 - 100.0 fL   MCH 30.8  26.0 - 34.0 pg   MCHC 32.5  30.0 - 36.0 g/dL   RDW 40.9  81.1 - 91.4 %   Platelets 243  150 - 400 K/uL  COMPREHENSIVE METABOLIC PANEL     Status: Abnormal   Collection Time    06/05/13  4:48 PM      Result Value Range   Sodium 136  135 - 145 mEq/L   Potassium 4.2  3.5 - 5.1 mEq/L   Chloride 98  96 - 112 mEq/L   CO2 24  19 - 32 mEq/L   Glucose, Bld 126 (*) 70 - 99 mg/dL   BUN 34 (*) 6 - 23 mg/dL   Creatinine, Ser 7.82  0.50 - 1.10 mg/dL   Calcium 9.3  8.4 - 95.6 mg/dL   Total Protein 8.1  6.0 - 8.3 g/dL   Albumin 4.4  3.5 - 5.2 g/dL   AST 29  0 - 37 U/L    ALT 23  0 - 35 U/L   Alkaline Phosphatase 78  39 - 117 U/L   Total Bilirubin 0.5  0.3 - 1.2 mg/dL   GFR calc non Af Amer 73 (*) >90 mL/min   GFR calc Af Amer 84 (*) >90 mL/min   Comment: (NOTE)     The eGFR has been calculated using the CKD EPI equation.     This calculation has not been validated in all clinical situations.     eGFR's persistently <90 mL/min signify possible Chronic Kidney     Disease.  TYPE AND SCREEN     Status: None   Collection Time    06/05/13  4:48 PM      Result Value Range   ABO/RH(D) A POS     Antibody Screen NEG     Sample Expiration 06/08/2013    PROTIME-INR     Status: None   Collection Time    06/05/13  4:48 PM      Result Value Range   Prothrombin Time 13.3  11.6 - 15.2 seconds   INR 1.03  0.00 - 1.49  URINALYSIS, ROUTINE W REFLEX MICROSCOPIC     Status: Abnormal   Collection Time    06/05/13  5:49 PM      Result Value Range   Color, Urine YELLOW  YELLOW   APPearance CLEAR  CLEAR   Specific Gravity, Urine 1.018  1.005 - 1.030   pH 7.5  5.0 - 8.0   Glucose, UA NEGATIVE  NEGATIVE mg/dL   Hgb urine dipstick NEGATIVE  NEGATIVE   Bilirubin Urine NEGATIVE  NEGATIVE   Ketones, ur NEGATIVE  NEGATIVE mg/dL   Protein, ur NEGATIVE  NEGATIVE mg/dL   Urobilinogen, UA 0.2  0.0 - 1.0 mg/dL   Nitrite NEGATIVE  NEGATIVE   Leukocytes, UA SMALL (*) NEGATIVE  URINE MICROSCOPIC-ADD ON     Status: None   Collection Time    06/05/13  5:49 PM      Result Value Range   Squamous Epithelial / LPF RARE  RARE   WBC, UA 7-10  <3 WBC/hpf   Bacteria, UA RARE  RARE    Dg Chest 1 View  06/05/2013   CLINICAL DATA:  Fall, hip injury, preop.  EXAM: CHEST - 1 VIEW  COMPARISON:  06/05/2012  FINDINGS: Cardiomegaly. Diffuse interstitial prominence throughout the lungs. Peribronchial thickening. No effusions. No acute bony abnormality.  IMPRESSION: Cardiomegaly. Peribronchial thickening and interstitial prominence. While this could reflect development of chronic interstitial  lung disease, I cannot exclude interstitial edema.   Electronically Signed   By: Charlett Nose M.D.   On: 06/05/2013 18:33   Dg Hip Complete Left  06/05/2013   CLINICAL DATA:  Fall, left hip pain.  EXAM: LEFT HIP - COMPLETE 2+ VIEW  COMPARISON:  None.  FINDINGS: There is a left femoral neck fracture with varus angulation and mild impaction. No subluxation or dislocation. Mild symmetric degenerative changes in the hips bilaterally. Contrast material noted within the left colonic diverticula.  IMPRESSION: Left femoral neck fracture with varus angulation and impaction.   Electronically Signed   By: Charlett Nose M.D.   On: 06/05/2013 18:31    Review of Systems  Constitutional: Negative.   HENT: Negative.   Respiratory: Negative.   Cardiovascular: Negative.   Gastrointestinal: Negative.   Musculoskeletal: Positive for falls and joint pain. Negative for back pain and neck pain.  Neurological: Negative.    Blood pressure 145/68, pulse 84, temperature 98.2 F (36.8 C), temperature source Oral, resp. rate 18, SpO2 96.00%. Physical Exam  Constitutional: She appears well-developed and well-nourished. No distress.  HENT:  Head: Normocephalic and atraumatic.  Eyes: EOM are normal. Pupils are equal, round, and reactive to light.  Neck: Neck supple.  Cardiovascular:  Pulses:      Dorsalis pedis pulses are 2+ on the left side.       Posterior tibial pulses are 2+ on the left side.  Musculoskeletal:       Left hip: She exhibits decreased range of motion (pain on hip roll) and tenderness.       Legs:   Assessment/Plan: Displaced Left Femoral Neck Fracture  Patient will be admitted to [facility] by the medical service.  She will be placed at bedrest and worked up for probable surgery. She will likely need a left hip hemiarthroplasty for the left hip fracture.  Mikayla Villanueva to evaluate patient and determine final plans.  Mikayla Villanueva 06/05/2013, 8:35 PM     I saw the patient earlier and  examined her and agree with the above findings. She had a mechanical fall on her basement steps leading to a displaced left femoral neck fracture. I discussed treatment options with her and her family and we will plan on left hip hemiarthroplasty on Sunday morning.

## 2013-06-05 NOTE — Progress Notes (Signed)
Dr. Lequita Halt came by and evaluated the patient.  Discussed surgical plans with her and her family.  He will plan on performing the surgery on Sunday so will allow her to eat on Saturday and then NPO after MN on Saturday night for surgery on Sunday.  Consent for Left Hip Hemiarthroplasty has been ordered.  Avel Peace, PA-C

## 2013-06-06 DIAGNOSIS — S72009S Fracture of unspecified part of neck of unspecified femur, sequela: Secondary | ICD-10-CM

## 2013-06-06 LAB — CBC WITH DIFFERENTIAL/PLATELET
Basophils Relative: 0 % (ref 0–1)
Eosinophils Absolute: 0 10*3/uL (ref 0.0–0.7)
HCT: 38.8 % (ref 36.0–46.0)
Hemoglobin: 12.9 g/dL (ref 12.0–15.0)
Lymphocytes Relative: 20 % (ref 12–46)
MCH: 31.5 pg (ref 26.0–34.0)
MCHC: 33.2 g/dL (ref 30.0–36.0)
Monocytes Absolute: 0.9 10*3/uL (ref 0.1–1.0)
Monocytes Relative: 7 % (ref 3–12)
Neutrophils Relative %: 73 % (ref 43–77)
RDW: 13.5 % (ref 11.5–15.5)
WBC: 13.6 10*3/uL — ABNORMAL HIGH (ref 4.0–10.5)

## 2013-06-06 LAB — BASIC METABOLIC PANEL
BUN: 25 mg/dL — ABNORMAL HIGH (ref 6–23)
Calcium: 8.3 mg/dL — ABNORMAL LOW (ref 8.4–10.5)
Creatinine, Ser: 0.7 mg/dL (ref 0.50–1.10)
GFR calc non Af Amer: 75 mL/min — ABNORMAL LOW (ref >90)
Glucose, Bld: 171 mg/dL — ABNORMAL HIGH (ref 70–99)
Potassium: 3.8 mEq/L (ref 3.5–5.1)

## 2013-06-06 LAB — SURGICAL PCR SCREEN
MRSA, PCR: NEGATIVE
Staphylococcus aureus: POSITIVE — AB

## 2013-06-06 MED ORDER — HYDROCODONE-ACETAMINOPHEN 5-325 MG PO TABS
1.0000 | ORAL_TABLET | ORAL | Status: AC | PRN
  Administered 2013-06-06: 08:00:00 1 via ORAL
  Administered 2013-06-06 (×2): 2 via ORAL
  Filled 2013-06-06 (×2): qty 2

## 2013-06-06 NOTE — Progress Notes (Signed)
SLP Cancellation Note  Patient Details Name: Mikayla Villanueva MRN: [id number] DOB: 1920/10/04   Cancelled treatment: ST received order for BSE this date.  Attempt x1 but not completed due to patient presenting with decreased LOA with inability to participate fully.  Per family member patient just received pain medication.  Family members bedside report baseline difficulty swallowing pills but no history of difficulty with solids or liquids.  Patient NPO starting at midnight as scheduled for surgery next date.  ST to f/u on 06/08/13 to complete BSE if warranted.   Moreen Fowler MS, CCC-SLP [phone] [facility] 06/06/2013, 5:34 PM

## 2013-06-06 NOTE — Progress Notes (Signed)
[facility] PROGRESS NOTE  Mikayla Villanueva UJW:[id number] DOB: 11/06/1920 DOA: 06/05/2013 PCP: Joycelyn Rua, MD  Assessment/Plan: 1. Left hip fracture - Per Orthopaedic surgery - agree with continuing B blocker  2. HTN - Lisinopril held on admission - B blocker on board and blood pressure relatively well controlled.  3. UTI - On Rocephin, awaiting urine cultures.  Patient with no complaints of dysuria and U/A showed only small leukocytes.  As such threshold to discontinue antibiotics low.  4. COPD - Will continue Spiriva and albuterol - stable  5. Hypothyroidism - Levothyroxine on board, stable  Code Status: DNR Family Communication: discussed with patient and family at bedside.  Disposition Plan: Per orthopaedic surgeons   Consultants:  Orthopaedic surgeons  Procedures:  None  Antibiotics:  Rocephin  HPI/Subjective: Patient states that pain is well controlled on current pain regimen.    Objective: Filed Vitals:   06/06/13 0700  BP: 115/70  Pulse: 66  Temp: 98.4 F (36.9 C)  Resp: 16    Intake/Output Summary (Last 24 hours) at 06/06/13 1009 Last data filed at 06/06/13 0745  Gross per 24 hour  Intake  857.5 ml  Output   1750 ml  Net -892.5 ml   Filed Weights   06/05/13 2227  Weight: 68.04 kg (150 lb)    Exam:   General:  Pt in NAD, Alert and Awake  Cardiovascular: RRR, no MRG  Respiratory: CTA BL, no wheezes, prolonged expiratory phase  Abdomen: soft, NT, ND  Musculoskeletal: no cyanosis or clubbing   Data Reviewed: Basic Metabolic Panel:  Recent Labs Lab 06/05/13 1648 06/06/13 0422  NA 136 133*  K 4.2 3.8  CL 98 98  CO2 24 26  GLUCOSE 126* 171*  BUN 34* 25*  CREATININE 0.75 0.70  CALCIUM 9.3 8.3*   Liver Function Tests:  Recent Labs Lab 06/05/13 1648  AST 29  ALT 23  ALKPHOS 78  BILITOT 0.5  PROT 8.1  ALBUMIN 4.4   No results found for this basename: LIPASE, AMYLASE,  in the last 168 hours No results found  for this basename: AMMONIA,  in the last 168 hours CBC:  Recent Labs Lab 06/05/13 1648 06/06/13 0422  WBC 10.2 13.6*  NEUTROABS  --  9.9*  HGB 14.2 12.9  HCT 43.7 38.8  MCV 94.8 94.6  PLT 243 231   Cardiac Enzymes: No results found for this basename: CKTOTAL, CKMB, CKMBINDEX, TROPONINI,  in the last 168 hours BNP (last 3 results) No results found for this basename: PROBNP,  in the last 8760 hours CBG: No results found for this basename: GLUCAP,  in the last 168 hours  No results found for this or any previous visit (from the past 240 hour(s)).   Studies: Dg Chest 1 View  06/05/2013   CLINICAL DATA:  Fall, hip injury, preop.  EXAM: CHEST - 1 VIEW  COMPARISON:  06/05/2012  FINDINGS: Cardiomegaly. Diffuse interstitial prominence throughout the lungs. Peribronchial thickening. No effusions. No acute bony abnormality.  IMPRESSION: Cardiomegaly. Peribronchial thickening and interstitial prominence. While this could reflect development of chronic interstitial lung disease, I cannot exclude interstitial edema.   Electronically Signed   By: Charlett Nose M.D.   On: 06/05/2013 18:33   Dg Hip Complete Left  06/05/2013   CLINICAL DATA:  Fall, left hip pain.  EXAM: LEFT HIP - COMPLETE 2+ VIEW  COMPARISON:  None.  FINDINGS: There is a left femoral neck fracture with varus angulation and mild impaction. No subluxation or dislocation.  Mild symmetric degenerative changes in the hips bilaterally. Contrast material noted within the left colonic diverticula.  IMPRESSION: Left femoral neck fracture with varus angulation and impaction.   Electronically Signed   By: Charlett Nose M.D.   On: 06/05/2013 18:31    Scheduled Meds: . cefTRIAXone (ROCEPHIN) IVPB 1 gram/50 mL D5W  1 g Intravenous Q24H  . docusate sodium  100 mg Oral BID  . levothyroxine  88 mcg Oral QAC breakfast  . metoprolol succinate  50 mg Oral Daily  . tiotropium  18 mcg Inhalation Daily   Continuous Infusions:   Active Problems:    Closed left hip fracture   Hypertension   COPD (chronic obstructive pulmonary disease)   UTI (lower urinary tract infection)    Time spent: > 35 minutes    Penny Pia  [facility] Pager [phone]. If 7PM-7AM, please contact night-coverage at [email or web address], password [facility] 06/06/2013, 10:09 AM  LOS: 1 day

## 2013-06-06 NOTE — Progress Notes (Signed)
   Subjective: Left hip fracture  Patient reports pain as mild, pain controlled with medication.  States she is planning on surgery tomorrow, on Sunday per Dr. Lequita Halt. No events throughout the night.   Objective:   VITALS:   Filed Vitals:   06/06/13 0700  BP: 115/70  Pulse: 66  Temp: 98.4 F (36.9 C)  Resp: 16    Neurovascular intact Dorsiflexion/Plantar flexion intact  LABS  Recent Labs  06/05/13 1648 06/06/13 0422  HGB 14.2 12.9  HCT 43.7 38.8  WBC 10.2 13.6*  PLT 243 231     Recent Labs  06/05/13 1648 06/06/13 0422  NA 136 133*  K 4.2 3.8  BUN 34* 25*  CREATININE 0.75 0.70  GLUCOSE 126* 171*     Assessment/Plan: Left hip fracture   Plan for left hip hemiarthroplasty on Sunday per Dr. Lequita Halt. NPO after midnight. Norco was reordered to assist in pain management until MN when she will be NPO    Mikayla Villanueva. Mikayla Villanueva   PAC  06/06/2013, 10:03 AM

## 2013-06-07 ENCOUNTER — Inpatient Hospital Stay (HOSPITAL_COMMUNITY): Payer: Medicare Other | Admitting: Anesthesiology

## 2013-06-07 ENCOUNTER — Encounter (HOSPITAL_COMMUNITY)
Admission: EM | Disposition: A | Discharge status: Skilled Nursing Facility | Payer: Self-pay | Source: Home / Self Care | Attending: Internal Medicine

## 2013-06-07 ENCOUNTER — Encounter (HOSPITAL_COMMUNITY): Payer: Medicare Other | Admitting: Anesthesiology

## 2013-06-07 HISTORY — PX: HIP ARTHROPLASTY: SHX981

## 2013-06-07 LAB — CBC
HCT: 42 % (ref 36.0–46.0)
MCH: 31.7 pg (ref 26.0–34.0)
MCV: 95 fL (ref 78.0–100.0)
Platelets: 216 10*3/uL (ref 150–400)
RBC: 4.42 MIL/uL (ref 3.87–5.11)
RDW: 13.7 % (ref 11.5–15.5)

## 2013-06-07 SURGERY — HEMIARTHROPLASTY, HIP, DIRECT ANTERIOR APPROACH, FOR FRACTURE
Anesthesia: General | Site: Hip | Laterality: Left | Wound class: Clean

## 2013-06-07 MED ORDER — HYDROCODONE-ACETAMINOPHEN 5-325 MG PO TABS
1.0000 | ORAL_TABLET | Freq: Four times a day (QID) | ORAL | Status: DC | PRN
  Administered 2013-06-08 – 2013-06-10 (×3): 1 via ORAL
  Filled 2013-06-07 (×3): qty 1

## 2013-06-07 MED ORDER — LACTATED RINGERS IV SOLN
INTRAVENOUS | Status: DC | PRN
  Administered 2013-06-07: 08:00:00 via INTRAVENOUS

## 2013-06-07 MED ORDER — SUCCINYLCHOLINE CHLORIDE 20 MG/ML IJ SOLN
INTRAMUSCULAR | Status: AC
  Filled 2013-06-07: qty 1

## 2013-06-07 MED ORDER — ATROPINE SULFATE 0.4 MG/ML IJ SOLN
INTRAMUSCULAR | Status: AC
  Filled 2013-06-07: qty 1

## 2013-06-07 MED ORDER — PROMETHAZINE HCL 25 MG/ML IJ SOLN: 6.2500 mg | INTRAMUSCULAR | Status: DC | PRN

## 2013-06-07 MED ORDER — 0.9 % SODIUM CHLORIDE (POUR BTL) OPTIME
TOPICAL | Status: DC | PRN
  Administered 2013-06-07: 1000 mL

## 2013-06-07 MED ORDER — METOCLOPRAMIDE HCL 10 MG PO TABS: 5.0000 mg | ORAL_TABLET | Freq: Three times a day (TID) | ORAL | Status: DC | PRN

## 2013-06-07 MED ORDER — CISATRACURIUM BESYLATE 20 MG/10ML IV SOLN
INTRAVENOUS | Status: AC
  Filled 2013-06-07: qty 10

## 2013-06-07 MED ORDER — PHENOL 1.4 % MT LIQD: 1.0000 | OROMUCOSAL | Status: DC | PRN

## 2013-06-07 MED ORDER — ONDANSETRON HCL 4 MG/2ML IJ SOLN: 4.0000 mg | Freq: Four times a day (QID) | INTRAMUSCULAR | Status: DC | PRN

## 2013-06-07 MED ORDER — TRAMADOL HCL 50 MG PO TABS
50.0000 mg | ORAL_TABLET | Freq: Four times a day (QID) | ORAL | Status: DC | PRN
  Administered 2013-06-08 (×2): 50 mg via ORAL
  Administered 2013-06-08 – 2013-06-10 (×4): 100 mg via ORAL
  Filled 2013-06-07: qty 1
  Filled 2013-06-07 (×4): qty 2
  Filled 2013-06-07: qty 1

## 2013-06-07 MED ORDER — ACETAMINOPHEN 650 MG RE SUPP: 650.0000 mg | Freq: Four times a day (QID) | RECTAL | Status: DC | PRN

## 2013-06-07 MED ORDER — ENOXAPARIN SODIUM 40 MG/0.4ML ~~LOC~~ SOLN
40.0000 mg | SUBCUTANEOUS | Status: DC
  Administered 2013-06-08 – 2013-06-10 (×3): 40 mg via SUBCUTANEOUS
  Filled 2013-06-07 (×5): qty 0.4

## 2013-06-07 MED ORDER — FENTANYL CITRATE 0.05 MG/ML IJ SOLN
25.0000 ug | INTRAMUSCULAR | Status: DC | PRN
  Administered 2013-06-07 (×2): 50 ug via INTRAVENOUS

## 2013-06-07 MED ORDER — ONDANSETRON HCL 4 MG PO TABS: 4.0000 mg | ORAL_TABLET | Freq: Four times a day (QID) | ORAL | Status: DC | PRN

## 2013-06-07 MED ORDER — ACETAMINOPHEN 325 MG PO TABS
650.0000 mg | ORAL_TABLET | Freq: Four times a day (QID) | ORAL | Status: DC | PRN
  Administered 2013-06-07: 13:00:00 650 mg via ORAL
  Filled 2013-06-07: qty 2

## 2013-06-07 MED ORDER — FENTANYL CITRATE 0.05 MG/ML IJ SOLN
INTRAMUSCULAR | Status: DC | PRN
  Administered 2013-06-07 (×4): 25 ug via INTRAVENOUS

## 2013-06-07 MED ORDER — EPHEDRINE SULFATE 50 MG/ML IJ SOLN
INTRAMUSCULAR | Status: AC
  Filled 2013-06-07: qty 1

## 2013-06-07 MED ORDER — METOCLOPRAMIDE HCL 5 MG/ML IJ SOLN: 5.0000 mg | Freq: Three times a day (TID) | INTRAMUSCULAR | Status: DC | PRN

## 2013-06-07 MED ORDER — ONDANSETRON HCL 4 MG/2ML IJ SOLN
INTRAMUSCULAR | Status: AC
  Filled 2013-06-07: qty 2

## 2013-06-07 MED ORDER — FENTANYL CITRATE 0.05 MG/ML IJ SOLN
INTRAMUSCULAR | Status: AC
  Filled 2013-06-07: qty 2

## 2013-06-07 MED ORDER — PROPOFOL 10 MG/ML IV BOLUS
INTRAVENOUS | Status: AC
  Filled 2013-06-07: qty 20

## 2013-06-07 MED ORDER — SUCCINYLCHOLINE CHLORIDE 20 MG/ML IJ SOLN
INTRAMUSCULAR | Status: DC | PRN
  Administered 2013-06-07: 100 mg via INTRAVENOUS

## 2013-06-07 MED ORDER — ONDANSETRON HCL 4 MG/2ML IJ SOLN
INTRAMUSCULAR | Status: DC | PRN
  Administered 2013-06-07: 4 mg via INTRAVENOUS

## 2013-06-07 MED ORDER — MENTHOL 3 MG MT LOZG: 1.0000 | LOZENGE | OROMUCOSAL | Status: DC | PRN

## 2013-06-07 MED ORDER — BACITRACIN ZINC 500 UNIT/GM EX OINT
TOPICAL_OINTMENT | CUTANEOUS | Status: AC
  Filled 2013-06-07: qty 28.35

## 2013-06-07 MED ORDER — EPHEDRINE SULFATE 50 MG/ML IJ SOLN
INTRAMUSCULAR | Status: DC | PRN
  Administered 2013-06-07: 10 mg via INTRAVENOUS

## 2013-06-07 MED ORDER — POTASSIUM CHLORIDE IN NACL 20-0.9 MEQ/L-% IV SOLN
INTRAVENOUS | Status: DC
  Administered 2013-06-07: 1000 mL via INTRAVENOUS
  Administered 2013-06-08: 18:00:00 75 mL/h via INTRAVENOUS
  Filled 2013-06-07 (×8): qty 1000

## 2013-06-07 MED ORDER — PROPOFOL 10 MG/ML IV BOLUS
INTRAVENOUS | Status: DC | PRN
  Administered 2013-06-07: 100 mg via INTRAVENOUS

## 2013-06-07 MED ORDER — STERILE WATER FOR IRRIGATION IR SOLN
Status: DC | PRN
  Administered 2013-06-07: 1500 mL

## 2013-06-07 SURGICAL SUPPLY — 48 items
BAG SPEC THK2 15X12 ZIP CLS (MISCELLANEOUS) ×1
BAG ZIPLOCK 12X15 (MISCELLANEOUS) ×2 IMPLANT
BIT DRILL 2.8X128 (BIT) ×2 IMPLANT
BLADE SAW SAG 73X25 THK (BLADE) ×1
BLADE SAW SGTL 73X25 THK (BLADE) ×1 IMPLANT
BRUSH FEMORAL CANAL (MISCELLANEOUS) ×2 IMPLANT
CAPT HIP FX BIPOLAR/UNIPOLAR ×1 IMPLANT
DRAPE INCISE IOBAN 66X45 STRL (DRAPES) ×2 IMPLANT
DRAPE ORTHO SPLIT 77X108 STRL (DRAPES) ×2
DRAPE POUCH INSTRU U-SHP 10X18 (DRAPES) ×2 IMPLANT
DRAPE SURG ORHT 6 SPLT 77X108 (DRAPES) ×1 IMPLANT
DRAPE U-SHAPE 47X51 STRL (DRAPES) ×2 IMPLANT
DRSG ADAPTIC 3X8 NADH LF (GAUZE/BANDAGES/DRESSINGS) ×1 IMPLANT
DRSG EMULSION OIL 3X16 NADH (GAUZE/BANDAGES/DRESSINGS) ×2 IMPLANT
DRSG MEPILEX BORDER 4X4 (GAUZE/BANDAGES/DRESSINGS) ×2 IMPLANT
DRSG MEPILEX BORDER 4X8 (GAUZE/BANDAGES/DRESSINGS) ×2 IMPLANT
DRSG PAD ABDOMINAL 8X10 ST (GAUZE/BANDAGES/DRESSINGS) ×2 IMPLANT
DURAPREP 26ML APPLICATOR (WOUND CARE) ×2 IMPLANT
ELECT REM PT RETURN 9FT ADLT (ELECTROSURGICAL) ×2
ELECTRODE REM PT RTRN 9FT ADLT (ELECTROSURGICAL) ×1 IMPLANT
EVACUATOR 1/8 PVC DRAIN (DRAIN) ×2 IMPLANT
FACESHIELD LNG OPTICON STERILE (SAFETY) ×6 IMPLANT
GLOVE BIO SURGEON STRL SZ7.5 (GLOVE) ×2 IMPLANT
GLOVE BIO SURGEON STRL SZ8 (GLOVE) ×4 IMPLANT
GLOVE BIOGEL PI IND STRL 8 (GLOVE) ×1 IMPLANT
GLOVE BIOGEL PI INDICATOR 8 (GLOVE) ×1
GOWN PREVENTION PLUS LG XLONG (DISPOSABLE) ×2 IMPLANT
GOWN STRL REIN XL XLG (GOWN DISPOSABLE) ×2 IMPLANT
HANDPIECE INTERPULSE COAX TIP (DISPOSABLE) ×2
IMMOBILIZER KNEE 20 (SOFTGOODS)
IMMOBILIZER KNEE 20 THIGH 36 (SOFTGOODS) IMPLANT
KIT BASIN OR (CUSTOM PROCEDURE TRAY) ×2 IMPLANT
MANIFOLD NEPTUNE II (INSTRUMENTS) ×2 IMPLANT
PACK TOTAL JOINT (CUSTOM PROCEDURE TRAY) ×2 IMPLANT
PASSER SUT SWANSON 36MM LOOP (INSTRUMENTS) ×2 IMPLANT
POSITIONER SURGICAL ARM (MISCELLANEOUS) ×2 IMPLANT
PRESSURIZER FEMORAL UNIV (MISCELLANEOUS) ×2 IMPLANT
SET HNDPC FAN SPRY TIP SCT (DISPOSABLE) ×1 IMPLANT
SPONGE GAUZE 4X4 12PLY (GAUZE/BANDAGES/DRESSINGS) ×3 IMPLANT
STRIP CLOSURE SKIN 1/2X4 (GAUZE/BANDAGES/DRESSINGS) ×2 IMPLANT
SUT ETHIBOND NAB CT1 #1 30IN (SUTURE) ×4 IMPLANT
SUT MNCRL AB 4-0 PS2 18 (SUTURE) ×2 IMPLANT
SUT VIC AB 1 CT1 27 (SUTURE) ×6
SUT VIC AB 1 CT1 27XBRD ANTBC (SUTURE) ×3 IMPLANT
SUT VIC AB 2-0 CT1 27 (SUTURE) ×6
SUT VIC AB 2-0 CT1 TAPERPNT 27 (SUTURE) ×3 IMPLANT
TOWEL OR 17X26 10 PK STRL BLUE (TOWEL DISPOSABLE) ×4 IMPLANT
TOWER CARTRIDGE SMART MIX (DISPOSABLE) ×2 IMPLANT

## 2013-06-07 NOTE — Brief Op Note (Signed)
06/05/2013 - 06/07/2013  9:19 AM  PATIENT:  Mikayla Villanueva  79 y.o. female  PRE-OPERATIVE DIAGNOSIS:  left femoral neck fracture  POST-OPERATIVE DIAGNOSIS:  left femoral neck fracture  PROCEDURE:  Procedure(s): ARTHROPLASTY BIPOLAR HIP (Left)  SURGEON:  Surgeon(s) and Role:    * Loanne Drilling, MD - Primary  PHYSICIAN ASSISTANT:   ASSISTANTS: Avel Peace, PA-C   ANESTHESIA:   general  EBL:  Total I/O In: -  Out: 1650 [Urine:1550; Blood:100]  BLOOD ADMINISTERED:none  DRAINS: (Medium) Hemovact drain(s) in the left hip with  Suction Open   LOCAL MEDICATIONS USED:  NONE  SPECIMEN:  No Specimen  DISPOSITION OF SPECIMEN:  N/A  COUNTS:  YES  TOURNIQUET:  * No tourniquets in log *  DICTATION: .Other Dictation: Dictation Number [id number]  PLAN OF CARE: Admit to inpatient   PATIENT DISPOSITION:  PACU - hemodynamically stable.

## 2013-06-07 NOTE — Transfer of Care (Signed)
Immediate Anesthesia Transfer of Care Note  Patient: Mikayla Villanueva  Procedure(s) Performed: Procedure(s): ARTHROPLASTY BIPOLAR HIP (Left)  Patient Location: PACU  Anesthesia Type:General  Level of Consciousness: awake and patient cooperative  Airway & Oxygen Therapy: Patient Spontanous Breathing and Patient connected to face mask oxygen  Post-op Assessment: Report given to PACU RN and Post -op Vital signs reviewed and stable  Post vital signs: Reviewed and stable  Complications: No apparent anesthesia complications

## 2013-06-07 NOTE — Anesthesia Preprocedure Evaluation (Signed)
Anesthesia Evaluation  Patient identified by MRN, date of birth, ID band Patient awake    Reviewed: Allergy & Precautions, H&P , NPO status , Patient's Chart, lab work & pertinent test results  Airway Mallampati: II TM Distance: >3 FB Neck ROM: Full    Dental no notable dental hx.    Pulmonary neg pulmonary ROS,  breath sounds clear to auscultation  Pulmonary exam normal       Cardiovascular hypertension, Pt. on medications and Pt. on home beta blockers negative cardio ROS  Rhythm:Regular Rate:Normal     Neuro/Psych negative neurological ROS  negative psych ROS   GI/Hepatic negative GI ROS, Neg liver ROS,   Endo/Other  negative endocrine ROSHypothyroidism   Renal/GU negative Renal ROS  negative genitourinary   Musculoskeletal negative musculoskeletal ROS (+)   Abdominal   Peds negative pediatric ROS (+)  Hematology negative hematology ROS (+)   Anesthesia Other Findings   Reproductive/Obstetrics negative OB ROS                           Anesthesia Physical Anesthesia Plan  ASA: II  Anesthesia Plan: General   Post-op Pain Management:    Induction: Intravenous  Airway Management Planned: Oral ETT  Additional Equipment:   Intra-op Plan:   Post-operative Plan: Extubation in OR  Informed Consent: I have reviewed the patients History and Physical, chart, labs and discussed the procedure including the risks, benefits and alternatives for the proposed anesthesia with the patient or authorized representative who has indicated his/her understanding and acceptance.   Dental advisory given  Plan Discussed with: CRNA and Surgeon  Anesthesia Plan Comments:         Anesthesia Quick Evaluation

## 2013-06-07 NOTE — Progress Notes (Signed)
[facility] PROGRESS NOTE  Mikayla Villanueva RUE:[id number] DOB: 20-Apr-1921 DOA: 06/05/2013 PCP: Joycelyn Rua, MD Brief narrative: 79 year old female with past medical history of hypothyroidism, hypertension, hyperlipidemia, diverticulitis who presented to the ED after a fall. X-ray of hip which showed left femoral neck fracture with varus angulation and impaction.  Assessment/Plan: 1. Left hip fracture - Management per Orthopaedic surgery - Patient is status post Left hip hemiarthroplasty.  2. HTN - Lisinopril held on admission - B blocker on board, we'll continue to monitor blood pressures and will consider adding lisinopril if blood pressure remaining elevated  3. UTI - On Rocephin, awaiting urine cultures.  Patient with no complaints of dysuria and U/A showed only small leukocytes.  As such threshold to discontinue antibiotics low.  4. COPD - Will continue Spiriva and albuterol - stable and compensated  5. Hypothyroidism - Levothyroxine on board, stable  Code Status: DNR Family Communication: discussed with patient and family at bedside.  Disposition Plan: Per orthopaedic surgeons   Consultants:  Orthopaedic surgeons  Procedures:  None  Antibiotics:  Rocephin  HPI/Subjective: No new complaints currently. No acute issues overnight. Patient had just gotten back from the operation.  Objective: Filed Vitals:   06/07/13 1350  BP: 144/81  Pulse: 93  Temp: 98.2 F (36.8 C)  Resp: 18    Intake/Output Summary (Last 24 hours) at 06/07/13 1545 Last data filed at 06/07/13 1022  Gross per 24 hour  Intake   2090 ml  Output   2860 ml  Net   -770 ml   Filed Weights   06/05/13 2227  Weight: 68.04 kg (150 lb)    Exam:   General:  Pt in NAD, Alert and Awake  Cardiovascular: RRR, no MRG  Respiratory: CTA BL, no wheezes, prolonged expiratory phase  Abdomen: soft, NT, ND  Musculoskeletal: No clubbing with wound VAC in place  Data Reviewed: Basic  Metabolic Panel:  Recent Labs Lab 06/05/13 1648 06/06/13 0422  NA 136 133*  K 4.2 3.8  CL 98 98  CO2 24 26  GLUCOSE 126* 171*  BUN 34* 25*  CREATININE 0.75 0.70  CALCIUM 9.3 8.3*   Liver Function Tests:  Recent Labs Lab 06/05/13 1648  AST 29  ALT 23  ALKPHOS 78  BILITOT 0.5  PROT 8.1  ALBUMIN 4.4   No results found for this basename: LIPASE, AMYLASE,  in the last 168 hours No results found for this basename: AMMONIA,  in the last 168 hours CBC:  Recent Labs Lab 06/05/13 1648 06/06/13 0422 06/07/13 0450  WBC 10.2 13.6* 11.5*  NEUTROABS  --  9.9*  --   HGB 14.2 12.9 14.0  HCT 43.7 38.8 42.0  MCV 94.8 94.6 95.0  PLT 243 231 216   Cardiac Enzymes: No results found for this basename: CKTOTAL, CKMB, CKMBINDEX, TROPONINI,  in the last 168 hours BNP (last 3 results) No results found for this basename: PROBNP,  in the last 8760 hours CBG: No results found for this basename: GLUCAP,  in the last 168 hours  Recent Results (from the past 240 hour(s))  URINE CULTURE     Status: None   Collection Time    06/05/13  5:49 PM      Result Value Range Status   Specimen Description URINE, CATHETERIZED   Final   Special Requests NONE   Final   Culture  Setup Time     Final   Value: 06/06/2013 03:16     Performed at [facility]  Colony Count PENDING   Incomplete   Culture     Final   Value: Culture reincubated for better growth     Performed at [facility]   Report Status PENDING   Incomplete  SURGICAL PCR SCREEN     Status: Abnormal   Collection Time    06/06/13  5:55 PM      Result Value Range Status   MRSA, PCR NEGATIVE  NEGATIVE Final   Staphylococcus aureus POSITIVE (*) NEGATIVE Final   Comment:            The Xpert SA Assay (FDA     approved for NASAL specimens     in patients over 28 years of age),     is one component of     a comprehensive surveillance     program.  Test performance has     been validated by [employer] for  patients greater     than or equal to 20 year old.     It is not intended     to diagnose infection nor to     guide or monitor treatment.     Studies: Dg Chest 1 View  06/05/2013   CLINICAL DATA:  Fall, hip injury, preop.  EXAM: CHEST - 1 VIEW  COMPARISON:  06/05/2012  FINDINGS: Cardiomegaly. Diffuse interstitial prominence throughout the lungs. Peribronchial thickening. No effusions. No acute bony abnormality.  IMPRESSION: Cardiomegaly. Peribronchial thickening and interstitial prominence. While this could reflect development of chronic interstitial lung disease, I cannot exclude interstitial edema.   Electronically Signed   By: Charlett Nose M.D.   On: 06/05/2013 18:33   Dg Hip Complete Left  06/05/2013   CLINICAL DATA:  Fall, left hip pain.  EXAM: LEFT HIP - COMPLETE 2+ VIEW  COMPARISON:  None.  FINDINGS: There is a left femoral neck fracture with varus angulation and mild impaction. No subluxation or dislocation. Mild symmetric degenerative changes in the hips bilaterally. Contrast material noted within the left colonic diverticula.  IMPRESSION: Left femoral neck fracture with varus angulation and impaction.   Electronically Signed   By: Charlett Nose M.D.   On: 06/05/2013 18:31    Scheduled Meds: . cefTRIAXone (ROCEPHIN) IVPB 1 gram/50 mL D5W  1 g Intravenous Q24H  . docusate sodium  100 mg Oral BID  . [START ON 06/08/2013] enoxaparin (LOVENOX) injection  40 mg Subcutaneous Q24H  . fentaNYL      . levothyroxine  88 mcg Oral QAC breakfast  . metoprolol succinate  50 mg Oral Daily  . tiotropium  18 mcg Inhalation Daily   Continuous Infusions: . 0.9 % NaCl with KCl 20 mEq / L 1,000 mL (06/07/13 1030)    Active Problems:   Closed left hip fracture   Hypertension   COPD (chronic obstructive pulmonary disease)   UTI (lower urinary tract infection)    Time spent: > 35 minutes    Penny Pia  [facility] Pager [phone]. If 7PM-7AM, please contact night-coverage at  [email or web address], password [facility] 06/07/2013, 3:45 PM  LOS: 2 days

## 2013-06-07 NOTE — Progress Notes (Addendum)
06/07/2013 1415 NCM spoke to dtr, [facility] # [phone]. States she had [facility] in the past and wanted [facility] if pt's dc home with HH. Pt has RW, w/c and bedside commode at home. Dtr will be contact for home. Waiting final recommendations and orders. Notified [facility] of referral. Isidoro Donning RN CCM [facility] phone [phone]

## 2013-06-07 NOTE — Preoperative (Signed)
Beta Blockers   Reason not to administer Beta Blockers:Not ApplicableNPO, received 06/06/13

## 2013-06-07 NOTE — Interval H&P Note (Signed)
History and Physical Interval Note:  06/07/2013 8:18 AM  Mikayla Villanueva  has presented today for surgery, with the diagnosis of left femoral neck fracture  The various methods of treatment have been discussed with the patient and family. After consideration of risks, benefits and other options for treatment, the patient has consented to  Procedure(s): ARTHROPLASTY BIPOLAR HIP (Left) as a surgical intervention .  The patient's history has been reviewed, patient examined, no change in status, stable for surgery.  I have reviewed the patient's chart and labs.  Questions were answered to the patient's satisfaction.     Loanne Drilling

## 2013-06-07 NOTE — Anesthesia Postprocedure Evaluation (Signed)
  Anesthesia Post-op Note  Patient: Mikayla Villanueva  Procedure(s) Performed: Procedure(s) (LRB): ARTHROPLASTY BIPOLAR HIP (Left)  Patient Location: PACU  Anesthesia Type: General  Level of Consciousness: awake and alert   Airway and Oxygen Therapy: Patient Spontanous Breathing  Post-op Pain: mild  Post-op Assessment: Post-op Vital signs reviewed, Patient's Cardiovascular Status Stable, Respiratory Function Stable, Patent Airway and No signs of Nausea or vomiting  Last Vitals:  Filed Vitals:   06/07/13 0736  BP:   Pulse:   Temp:   Resp: 16    Post-op Vital Signs: stable   Complications: No apparent anesthesia complications

## 2013-06-07 NOTE — Op Note (Signed)
NAME:  Fehr, FINESSE FIELDER NO.:  [id number]  MEDICAL RECORD NO.:  [id number]  LOCATION:  [facility]                         FACILITY:  [facility]  PHYSICIAN:  Ollen Gross, M.D.    DATE OF BIRTH:  1920/08/02  DATE OF PROCEDURE: DATE OF DISCHARGE:                              OPERATIVE REPORT   PREOPERATIVE DIAGNOSIS:  Left femoral neck fracture.  POSTOPERATIVE DIAGNOSIS:  Left femoral neck fracture.  PROCEDURE:  Left hip hemiarthroplasty.  SURGEON:  Ollen Gross, M.D.  ASSISTANT:  Alexzandrew L. Perkins, P.A.C.  ANESTHESIA:  General.  ESTIMATED BLOOD LOSS:  100.  DRAINS:  Hemovac x1.  COMPLICATIONS:  None.  CONDITION:  Stable to recovery.  BRIEF CLINICAL NOTE:  Mikayla Villanueva is a 79 year old female, had a mechanical fall 2 days ago, sustaining a displaced left femoral neck fracture.  She presents now for left hip hemiarthroplasty.  PROCEDURE IN DETAIL:  After successful administration of general anesthetic, the patient was placed in the right lateral decubitus position in the left side up and held with the hip positioner repaired. Left lower extremity was isolated from her perineum with plastic drapes and prepped and draped in the usual sterile fashion.  Short posterolateral incision was made with a 10 blade, through subcutaneous tissue to the fascia lata, which was incised in line with the skin incision.  Sciatic nerve was palpated and protected.  Short rotators and capsular isolated off the femur to expose the joint.  She had a complete displaced femoral neck fracture.  The femoral head was removed. Diameters 50 mm.  The femoral neck cut was freshened with an oscillating saw.  The retractors were placed and the cut was made.  Then I used a starter reamer and lateralizing reamer to gain access to the canal.  The canal was thoroughly irrigated to remove fatty contents.  Broaching started at size 1 up to a size 7, which had an excellent press fit.  We placed the  standard femoral neck with a 28 +0 head and a 50 bipolar. The bipolar fit extremely well into the acetabulum.  We needed a little extra length so went to a +5 head, which is equal leg lengths.  She had a great stability, full extension, full external rotation, 70 degrees flexion, 40 degrees adduction, about 90 degrees internal rotation, 90 degrees of flexion and 70 degrees of internal rotation.  The hip was dislocated and trials removed.  Permanent size 7 on the [place] [place] basic press-fit stem is impacted into the femur with excellent fit.  A 28+ 5 head, a 50 mm bipolar component was placed.  Hip was then reduced with the same stability parameters.  Wound was further irrigated with saline and then the short rotators and capsule reattached to the femur through drill holes with Ethibond suture.  Fascia lata was closed over Hemovac drain with a running #1 V-Loc suture.  Subcu is closed in interrupted 2-0 Vicryl, subcuticular running 4-0 Monocryl.  The incision was then cleaned and dried, and Steri-Strips and a bulky sterile dressing applied.  Drains hooked to suction and she was placed into a knee immobilizer, awakened and transported to recovery in stable  condition.  Note; that the surgical assistant is a medical necessity for this procedure in order to perform in a safe and expeditious manner. Surgical assistance necessary for retraction of vital neurovascular structures and appropriate positioning of the leg in order to properly place the prosthesis in anatomic position.     Ollen Gross, M.D.     FA/MEDQ  D:  06/07/2013  T:  06/07/2013  Job:  [id number]

## 2013-06-08 ENCOUNTER — Encounter (HOSPITAL_COMMUNITY): Payer: Self-pay | Admitting: Orthopedic Surgery

## 2013-06-08 DIAGNOSIS — G609 Hereditary and idiopathic neuropathy, unspecified: Secondary | ICD-10-CM | POA: Diagnosis present

## 2013-06-08 DIAGNOSIS — Z22322 Carrier or suspected carrier of Methicillin resistant Staphylococcus aureus: Secondary | ICD-10-CM

## 2013-06-08 LAB — BASIC METABOLIC PANEL
BUN: 18 mg/dL (ref 6–23)
CO2: 28 mEq/L (ref 19–32)
Chloride: 98 mEq/L (ref 96–112)
Creatinine, Ser: 0.59 mg/dL (ref 0.50–1.10)
GFR calc non Af Amer: 79 mL/min — ABNORMAL LOW (ref >90)
Glucose, Bld: 137 mg/dL — ABNORMAL HIGH (ref 70–99)

## 2013-06-08 LAB — CBC
HCT: 38 % (ref 36.0–46.0)
Hemoglobin: 12.6 g/dL (ref 12.0–15.0)
MCH: 31.3 pg (ref 26.0–34.0)
MCV: 94.5 fL (ref 78.0–100.0)
Platelets: 206 10*3/uL (ref 150–400)
RBC: 4.02 MIL/uL (ref 3.87–5.11)
RDW: 13.6 % (ref 11.5–15.5)
WBC: 12.2 10*3/uL — ABNORMAL HIGH (ref 4.0–10.5)

## 2013-06-08 LAB — VITAMIN D 25 HYDROXY (VIT D DEFICIENCY, FRACTURES): Vit D, 25-Hydroxy: 21 ng/mL — ABNORMAL LOW (ref 30–89)

## 2013-06-08 MED ORDER — ENSURE COMPLETE PO LIQD: 237.0000 mL | Freq: Three times a day (TID) | ORAL | Status: DC

## 2013-06-08 MED ORDER — GABAPENTIN 300 MG PO CAPS
300.0000 mg | ORAL_CAPSULE | Freq: Every day | ORAL | Status: DC
  Administered 2013-06-08: 300 mg via ORAL
  Filled 2013-06-08 (×2): qty 1

## 2013-06-08 NOTE — Progress Notes (Signed)
   Subjective: 1 Day Post-Op Procedure(s) (LRB): ARTHROPLASTY BIPOLAR HIP (Left) Patient reports pain as mild.   Patient seen in rounds by Dr. Lequita Halt. Patient is well, but has had some minor complaints of pain in the hip, requiring pain medications We will start therapy today.  Patient may be WBAT tot he left leg.  Objective: Vital signs in last 24 hours: Temp:  [97.3 F (36.3 C)-98.8 F (37.1 C)] 98.8 F (37.1 C) (11/24 0526) Pulse Rate:  [80-105] 89 (11/24 0526) Resp:  [16-27] 18 (11/24 0526) BP: (144-192)/(75-94) 144/80 mmHg (11/24 0526) SpO2:  [92 %-100 %] 95 % (11/24 0526)  Intake/Output from previous day:  Intake/Output Summary (Last 24 hours) at 06/08/13 0734 Last data filed at 06/08/13 0600  Gross per 24 hour  Intake 2362.5 ml  Output   3148 ml  Net -785.5 ml    Intake/Output this shift:    Labs:  Recent Labs  06/05/13 1648 06/06/13 0422 06/07/13 0450 06/08/13 0505  HGB 14.2 12.9 14.0 12.6    Recent Labs  06/07/13 0450 06/08/13 0505  WBC 11.5* 12.2*  RBC 4.42 4.02  HCT 42.0 38.0  PLT 216 206    Recent Labs  06/06/13 0422 06/08/13 0505  NA 133* 135  K 3.8 3.9  CL 98 98  CO2 26 28  BUN 25* 18  CREATININE 0.70 0.59  GLUCOSE 171* 137*  CALCIUM 8.3* 8.3*    Recent Labs  06/05/13 1648  INR 1.03    EXAM General - Patient is Alert and Appropriate Extremity - Neurovascular intact Sensation intact distally Dressing - dressing C/D/I Motor Function - intact, moving foot and toes well on exam.  Hemovac pulled without difficulty.  Past Medical History  Diagnosis Date  . Thyroid disease   . Hypertension   . Hyperlipemia   . Diverticulitis   . Arthritis   . Osteoporosis   . Hypothyroidism   . Hypercholesteremia     Assessment/Plan: 1 Day Post-Op Procedure(s) (LRB): ARTHROPLASTY BIPOLAR HIP (Left) Active Problems:   Closed left hip fracture   Hypertension   COPD (chronic obstructive pulmonary disease)   UTI (lower urinary  tract infection)  Estimated body mass index is 25.73 kg/(m^2) as calculated from the following:   Height as of this encounter: 5\' 4"  (1.626 m).   Weight as of this encounter: 68.04 kg (150 lb). Up with therapy  DVT Prophylaxis - Lovenox Weight Bearing As Tolerated left Leg D/C Knee Immobilizer Hemovac Pulled Begin Therapy Hip Preacutions  Mikayla Villanueva 06/08/2013, 7:34 AM

## 2013-06-08 NOTE — Progress Notes (Signed)
Clinical Social Work  Per MD, patient will DC tomorrow. CSW spoke with [facility] who is agreeable to accept patient tomorrow if medically stable. Patient and family relieved to have found a facility close to home and remain agreeable to plan. CSW will continue to follow.  Unk Lightning, LCSW  (Coverage for [employer])

## 2013-06-08 NOTE — Evaluation (Signed)
Occupational Therapy Evaluation Patient Details Name: Azyria Gatta MRN: [id number] DOB: [birth year] Today's Date: 06/08/2013 Time: 1191-4782 OT Time Calculation (min): 44 min  OT Assessment / Plan / Recommendation History of present illness 79 y.o. female admitted for L hip fx following a fall. Pt reports hitting her head on concrete.   Clinical Impression   Pt limited by dizziness at EOB this visit so only worked on EOB sitting and returned to supine as dizziness didn't improve. Pt reports dizziness to actually get worse with time and with head turns and looking up while sitting EOB. Informed nursing and left note for MD. Pt also limited by pain. Pt will benefit from continued OT services to increase independence with self care tasks for next venue.    OT Assessment  Patient needs continued OT Services    Follow Up Recommendations  SNF;Supervision/Assistance - 24 hour    Barriers to Discharge      Equipment Recommendations  None recommended by OT    Recommendations for Other Services    Frequency  Min 2X/week    Precautions / Restrictions Precautions Precautions: Fall;Posterior Hip Precaution Booklet Issued: Yes (comment) Precaution Comments: reviewed precautions with pt and daughter, sign hung in room Restrictions Weight Bearing Restrictions: No Other Position/Activity Restrictions: WBAT   Pertinent Vitals/Pain 152/84 sitting EOB 94-95% O2 at EOB on RA Pain reports pain in L hip and stomach. Didn't rate. Has had pain meds per pt.    ADL  Eating/Feeding: Set up Where Assessed - Eating/Feeding: Bed level Grooming: Simulated;Wash/dry face;Set up;Supervision/safety Where Assessed - Grooming: Supine, head of bed up Upper Body Bathing: Simulated;Chest;Right arm;Left arm;Abdomen;+1 Total assistance Where Assessed - Upper Body Bathing: Unsupported sitting Lower Body Bathing: +2 Total assistance Lower Body Bathing: Patient Percentage: 0% Where Assessed - Lower Body Bathing:  Rolling right and/or left;Supported sitting Upper Body Dressing: Simulated;Maximal assistance Where Assessed - Upper Body Dressing: Supine, head of bed up Lower Body Dressing: Simulated;+1 Total assistance Where Assessed - Lower Body Dressing: Supine, head of bed up Toilet Transfer:  (not able) ADL Comments: Pt sat EOB but reported dizziness during entire time sitting. Pt reports dizziness to get worse with more time and also with head turns R and L as well as looking up. Informed nursing and left note for MD. Pt reports hitting her head during fall on concrete. Introduced Fish farm manager of AE but Lexicographer. Daugther present for session. Pt returned to supine after sitting EOB approximately 10 minutes as she didnt feel she could transfer to chair today.     OT Diagnosis: Generalized weakness;Acute pain  OT Problem List: Decreased strength;Decreased knowledge of use of DME or AE;Decreased knowledge of precautions;Pain OT Treatment Interventions: Self-care/ADL training;DME and/or AE instruction;Therapeutic activities;Patient/family education   OT Goals(Current goals can be found in the care plan section) Acute Rehab OT Goals Patient Stated Goal: " I don't think I can do the therapy. Am I required to do the therapy? I really don't want to." OT Goal Formulation: With patient/family Time For Goal Achievement: 06/15/13 Potential to Achieve Goals: Good  Visit Information  Last OT Received On: 06/08/13 Assistance Needed: +2 PT/OT Co-Evaluation/Treatment: Yes History of Present Illness: 79 y.o. female admitted for L hip fx following a fall. Pt reports hitting her head on concrete.       Prior Functioning     Home Living Family/patient expects to be discharged to:: Private residence Living Arrangements: Alone Available Help at Discharge: Family;Available 24 hours/day (daughter lives next door and assists as  needed) Type of Home: House Home Access: Stairs to enter [employer] of  Steps: 3 in front , 4 in back; ; 1 handrail in front, one in back Entrance Stairs-Rails: Right Home Layout: One level Home Equipment: Grab bars - tub/shower;Cane - single point;Bedside commode;Shower seat;Walker - 2 wheels;Wheelchair - [employer] - 4 wheels Additional Comments: tub, handicapped height commode Prior Function Level of Independence: Independent with assistive device(s) Comments: independent with cooking and cleaning Communication Communication: No difficulties         Vision/Perception     Cognition  Cognition Arousal/Alertness: Lethargic;Awake/alert Behavior During Therapy: WFL for tasks assessed/performed Overall Cognitive Status: Within Functional Limits for tasks assessed    Extremity/Trunk Assessment Upper Extremity Assessment Upper Extremity Assessment: Generalized weakness (UE tremor noted R hand) Lower Extremity Assessment Lower Extremity Assessment: LLE deficits/detail LLE Deficits / Details: limited by pain, hip ABD 10* AAROM, flexion 40* AAROM; ankle WNL LLE Sensation: history of peripheral neuropathy Cervical / Trunk Assessment Cervical / Trunk Assessment: Kyphotic     Mobility Bed Mobility Bed Mobility: Rolling Left;Supine to Sit;Sit to Supine Rolling Left: 1: +2 Total assist Rolling Left: Patient Percentage: 10% Supine to Sit: 1: +2 Total assist Supine to Sit: Patient Percentage: 10% Sit to Supine: 1: +2 Total assist Sit to Supine: Patient Percentage: 0% Details for Bed Mobility Assistance: All movement limited by pain; Pt sat on EOB 10 minutes with total assist for sitting balance 2* posterior and right lateral lean. Dizziness reported in sitting which did not resolve, increased with turning head and lifting head. VSS. Transfers Details for Transfer Assistance: pt not able this visit        Balance     End of Session OT - End of Session Activity Tolerance: Patient limited by lethargy;Patient limited by pain;Other (comment)  (dizziness) Patient left: in bed;with call bell/phone within reach;with family/visitor present  GO     Lennox Laity [phone] 06/08/2013, 9:55 AM

## 2013-06-08 NOTE — Progress Notes (Signed)
Clinical Social Work Department BRIEF PSYCHOSOCIAL ASSESSMENT 06/08/2013  Patient:  Mikayla Villanueva,Mikayla Villanueva     Account Number:  [id number]     Admit date:  06/05/2013  Clinical Social Worker:  Dennison Bulla  Date/Time:  06/08/2013 01:00 PM  Referred by:  Physician  Date Referred:  06/08/2013 Referred for  SNF Placement   Other Referral:   Interview type:  Patient Other interview type:    PSYCHOSOCIAL DATA Living Status:  FAMILY Admitted from facility:   Level of care:   Primary support name:  Britta Mccreedy Primary support relationship to patient:  CHILD, ADULT Degree of support available:   Strong    CURRENT CONCERNS Current Concerns  Post-Acute Placement   Other Concerns:    SOCIAL WORK ASSESSMENT / PLAN CSW received referral to assist with DC planning. Per chart review, PT/OT is recommending SNF placement at DC. CSW met with patient and family at bedside. CSW introduced myself and explained role.    Patient reports that family can be involved in assessment. Patient and family aware of DC recommendations for SNF placement. CSW provided SNF list and explained process. Patient and family request placement at [facility] due to close proximity to house. CSW suggested that family review list and have alternative options in [place] [facility] is not available.    CSW completed FL2 and faxed out. CSW called and spoke with Gearldine Bienenstock at [facility] who will review referral and call CSW once it is determined if SNF can offer a bed. CSW will follow up with bed offers.   Assessment/plan status:  Psychosocial Support/Ongoing Assessment of Needs Other assessment/ plan:   Information/referral to community resources:   SNF list    PATIENT'S/FAMILY'S RESPONSE TO PLAN OF CARE: Patient oriented but somewhat drowsy during assessment. Patient and family agreeable to SNF placement and will be happy if patient can remain close to home. Patient thanked CSW for time and will call if any further questions  arise.       Unk Lightning, LCSW (Coverage for [employer])

## 2013-06-08 NOTE — Discharge Summary (Addendum)
Physician Discharge Summary  Seerat Pfarr ZOX:[id number] DOB: 07-Jul-1921 DOA: 06/05/2013  PCP: Joycelyn Rua, MD  Admit date: 06/05/2013 Discharge date: 06/08/2013  Time spent: 40 minutes  Recommendations for Outpatient Follow-up:   Left hip fracture  - Management per Orthopaedic surgery  - Patient is status post Left hip hemiarthroplasty on 06/07/2013 Dr. Ollen Gross (Orthopedic Surgeon).  -Physical therapy/OT; recommend SNF -Physical therapy will work with patient while she waits discharge today  -Continue Lovenox anticoagulation per orthopedic instructions.(within discharge summary instructions) -Continue pain medication per orthopedic instructions (within discharge summary instructions)  HTN  - BP currently within AHA guidelines continue to hold Lisinopril  - Discharge on Toprol XL 50 mg daily. SNF to consider adding lisinopril if blood pressure becomes elevated.  UTI  - Continue levofloxacin 250 mg daily for total of 5 days (culture showing enterococcus/viridans strep)  -Continue normal saline 153ml/hr to ensure patient hydrated properly until time for patient's discharge today   COPD  - continue Spiriva and albuterol  - stable and compensated   Hypothyroidism  - Continue Levothyroxine Daily   Neuropathy lower extremity  -Will discharge patient on Neurontin BID which has provided patient with some relief. PCP/SNF physician to titrate up.       Discharge Diagnoses:  Active Problems:   Closed left hip fracture   Hypertension   COPD (chronic obstructive pulmonary disease)   UTI (lower urinary tract infection)   Discharge Condition: Stable  Diet recommendation: Heart healthy  Filed Weights   06/05/13 2227  Weight: 68.04 kg (150 lb)    History of present illness: 79yo WF PMHx hypothyroidism, hypertension, hyperlipidemia, diverticulitis who presented to the ED after a fall. X-ray of hip which showed left femoral neck fracture with varus angulation and  impaction. Left hip hemiarthroplasty on 06/07/2013 Dr. Ollen Gross (Orthopedic Surgeon). 06/08/2013 patient mildly sleepy (just taken tramadol x2 per family) answers all questions appropriately. States was too tired and weak to participate fully with physical therapy today. States her normal neuropathy in her bilateral lower ankles and feet is worse and has radiated upward in her legs. Describes pain as burning/pins and needles. States has never been treated for the neuropathy and unsure of the cause. 06/09/2013 states feeling very fatigued, positive abdominal pain, negative CP, negative SOB, negative back pain. States did get a good nights sleep with Neurontin for her neuropathy however the leg pain has returned today and requests we increased the dosage. TODAY patient says the increased dose of Neurontin gave her more relief from her peripheral neuropathy but did not totally resolve her neuropathy. Patient states feels much less fatigued today, ready to get out of the bed, requests no one she can be discharged   Procedures: Negative MRSA by PCR 06/06/2013  Urine culture 06/05/2013, positive for enterococcus species and Viridans Streptococcus  Consultations: Orthopedic surgery  (Dr. Ollen Gross)   By  Discharge Exam: Filed Vitals:   06/08/13 0526 06/08/13 0911 06/08/13 0947 06/08/13 1002  BP: 144/80  152/84 154/78  Pulse: 89   86  Temp: 98.8 F (37.1 C)     TempSrc: Oral     Resp: 18     Height:      Weight:      SpO2: 95% 95% 94%    General:  A./O. x4, NAD, increased energy much more interactive with staff  Cardiovascular: Regular rhythm and rate, negative murmurs rubs or gallops, DP/PT pulse one plus bilateral  Respiratory: Clear to auscultation bilateral  Abdomen; nontender, nondistended, plus  bowel sounds  Musculoskeletal; pain to palpation bilateral lower feet ankles shin (decreased from yesterday). Left hip lateral aspect incision is clean dry dressing, negative erythema,  negative sign of infection. Negative CVA tenderness   Discharge Instructions     Medication List    ASK your doctor about these medications       acetaminophen 500 MG tablet  Commonly known as:  TYLENOL  Take 1,000 mg by mouth 2 (two) times daily as needed. For pain     b complex vitamins capsule  Take 1 capsule by mouth daily.     fish oil-omega-3 fatty acids 1000 MG capsule  Take 1 g by mouth 2 (two) times daily.     levothyroxine 88 MCG tablet  Commonly known as:  SYNTHROID, LEVOTHROID  Take 88 mcg by mouth daily.     lisinopril 20 MG tablet  Commonly known as:  PRINIVIL,ZESTRIL  Take 20 mg by mouth daily.     meloxicam 7.5 MG tablet  Commonly known as:  MOBIC  Take 7.5 mg by mouth daily as needed for pain.     metoprolol succinate 50 MG 24 hr tablet  Commonly known as:  TOPROL-XL  Take 50 mg by mouth daily. Take with or immediately following a meal.     simvastatin 40 MG tablet  Commonly known as:  ZOCOR  Take 40 mg by mouth every evening.     tiotropium 18 MCG inhalation capsule  Commonly known as:  SPIRIVA  Place 18 mcg into inhaler and inhale daily.     traMADol 50 MG tablet  Commonly known as:  ULTRAM  Take 25 mg by mouth every 12 (twelve) hours as needed for moderate pain.       Allergies  Allergen Reactions  . Benadryl [Diphenhydramine]     hallicuinates  . Codeine     hallucinates  . Morphine And Related     hallucinates  . Sulfa Antibiotics Hives  . Nitrofurantoin Monohyd Macro Hives      The results of significant diagnostics from this hospitalization (including imaging, microbiology, ancillary and laboratory) are listed below for reference.    Significant Diagnostic Studies: Dg Chest 1 View  06/05/2013   CLINICAL DATA:  Fall, hip injury, preop.  EXAM: CHEST - 1 VIEW  COMPARISON:  06/05/2012  FINDINGS: Cardiomegaly. Diffuse interstitial prominence throughout the lungs. Peribronchial thickening. No effusions. No acute bony abnormality.   IMPRESSION: Cardiomegaly. Peribronchial thickening and interstitial prominence. While this could reflect development of chronic interstitial lung disease, I cannot exclude interstitial edema.   Electronically Signed   By: Charlett Nose M.D.   On: 06/05/2013 18:33   Dg Hip Complete Left  06/05/2013   CLINICAL DATA:  Fall, left hip pain.  EXAM: LEFT HIP - COMPLETE 2+ VIEW  COMPARISON:  None.  FINDINGS: There is a left femoral neck fracture with varus angulation and mild impaction. No subluxation or dislocation. Mild symmetric degenerative changes in the hips bilaterally. Contrast material noted within the left colonic diverticula.  IMPRESSION: Left femoral neck fracture with varus angulation and impaction.   Electronically Signed   By: Charlett Nose M.D.   On: 06/05/2013 18:31   Ct Abdomen Pelvis W Contrast  05/27/2013   CLINICAL DATA:  Epigastric abdominal pain. History of diverticulitis.  EXAM: CT ABDOMEN AND PELVIS WITH CONTRAST  TECHNIQUE: Multidetector CT imaging of the abdomen and pelvis was performed using the standard protocol following bolus administration of intravenous contrast.  CONTRAST:  100 mL OMNIPAQUE  IOHEXOL 300 MG/ML  SOLN  COMPARISON:  CT abdomen and pelvis 04/02/2012.  FINDINGS: The lung bases are clear. There is cardiomegaly. No pleural or pericardial effusion. Small hiatal hernia is identified.  There is fatty infiltration of the liver without focal lesion. The gallbladder, adrenal glands, spleen, pancreas and right kidney are unremarkable. There is extensive sigmoid diverticulosis without diverticulitis. The colon is otherwise unremarkable. Small bowel is unremarkable. Cystocele seen on the comparison study is not visualized today. No lymphadenopathy or fluid is seen. Extensive aortoiliac atherosclerosis is noted. Mild aneurysmal dilatation of the descending abdominal aorta measuring 3.6 cm at the celiac origin is unchanged. No lytic or sclerotic bony lesion is identified. Remote  superior endplate compression fractures T12 and L3 are unchanged.  IMPRESSION: No acute finding.  Stable compared to prior CT scan.  Diverticulosis without diverticulitis.  Small hiatal hernia.  Cardiomegaly.  Aortoiliac atherosclerosis. 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  Fatty infiltration of the liver.   Electronically Signed   By: Drusilla Kanner M.D.   On: 05/27/2013 11:08    Microbiology: Recent Results (from the past 240 hour(s))  URINE CULTURE     Status: None   Collection Time    06/05/13  5:49 PM      Result Value Range Status   Specimen Description URINE, CATHETERIZED   Final   Special Requests NONE   Final   Culture  Setup Time     Final   Value: 06/06/2013 03:16     Performed at [facility] Count     Final   Value: >=100,000 COLONIES/ML     Performed at [facility]   Culture     Final   Value: ENTEROCOCCUS SPECIES     VIRIDANS STREPTOCOCCUS     Performed at [facility]   Report Status PENDING   Incomplete  SURGICAL PCR SCREEN     Status: Abnormal   Collection Time    06/06/13  5:55 PM      Result Value Range Status   MRSA, PCR NEGATIVE  NEGATIVE Final   Staphylococcus aureus POSITIVE (*) NEGATIVE Final   Comment:            The Xpert SA Assay (FDA     approved for NASAL specimens     in patients over 12 years of age),     is one component of     a comprehensive surveillance     program.  Test performance has     been validated by [employer] for patients greater     than or equal to 42 year old.     It is not intended     to diagnose infection nor to     guide or monitor treatment.     Labs: Basic Metabolic Panel:  Recent Labs Lab 06/05/13 1648 06/06/13 0422 06/08/13 0505  NA 136 133* 135  K 4.2 3.8 3.9  CL 98 98 98  CO2 24 26 28   GLUCOSE 126* 171* 137*  BUN 34* 25* 18  CREATININE 0.75 0.70 0.59  CALCIUM 9.3 8.3* 8.3*   Liver Function Tests:  Recent Labs Lab  06/05/13 1648  AST 29  ALT 23  ALKPHOS 78  BILITOT 0.5  PROT 8.1  ALBUMIN 4.4   No results found for this basename: LIPASE, AMYLASE,  in the last 168 hours No results found for this basename: AMMONIA,  in the last 168 hours CBC:  Recent Labs Lab 06/05/13 1648 06/06/13 0422 06/07/13 0450 06/08/13 0505  WBC 10.2 13.6* 11.5* 12.2*  NEUTROABS  --  9.9*  --   --   HGB 14.2 12.9 14.0 12.6  HCT 43.7 38.8 42.0 38.0  MCV 94.8 94.6 95.0 94.5  PLT 243 231 216 206   Cardiac Enzymes: No results found for this basename: CKTOTAL, CKMB, CKMBINDEX, TROPONINI,  in the last 168 hours BNP: BNP (last 3 results) No results found for this basename: PROBNP,  in the last 8760 hours CBG: No results found for this basename: GLUCAP,  in the last 168 hours     Signed:  Carolyne Littles, MD [facility] [phone] pager

## 2013-06-08 NOTE — Progress Notes (Signed)
[facility]  Patient Status: New  [facility] is providing the following services: PT, or whichever Home Health services are needed at d/c.  Referral called in over the weekend by Isidoro Donning, RN CM   If patient discharges after hours, please call [phone].   Lanae Crumbly 06/08/2013, 10:22 AM

## 2013-06-08 NOTE — Evaluation (Signed)
Physical Therapy Evaluation Patient Details Name: Mikayla Villanueva MRN: [id number] DOB: December 31, 1920 Today's Date: 06/08/2013 Time: 4098-1191 PT Time Calculation (min): 50 min  PT Assessment / Plan / Recommendation History of Present Illness  79 y.o. female admitted for L hip fx following a fall. Pt reports hitting her head on concrete.  Clinical Impression  **Pt admitted with L hip fx, s/p hemiarthroplasty**. Pt currently with functional limitations due to the deficits listed below (see PT Problem List).  Pt will benefit from skilled PT to increase their independence and safety with mobility to allow discharge to the venue listed below.    Pt reported dizziness while sitting on EOB x 10 minutes. This did not resolve and increased with head turns. Pt reports hitting head on concrete during fall. Consider head imaging if indicated.  *    PT Assessment  Patient needs continued PT services    Follow Up Recommendations  SNF;Supervision/Assistance - 24 hour    Does the patient have the potential to tolerate intense rehabilitation      Barriers to Discharge        Equipment Recommendations  None recommended by PT    Recommendations for Other Services OT consult   Frequency Min 5X/week    Precautions / Restrictions Precautions Precautions: Fall;Posterior Hip Precaution Booklet Issued: Yes (comment) Precaution Comments: reviewed precautions with pt and daughter, sign hung in room Restrictions Weight Bearing Restrictions: No Other Position/Activity Restrictions: WBAT   Pertinent Vitals/Pain *8/10 L hip Premedicated, ice applied SaO2 95% on RA, HR 96, BP 152/84 sitting with dizziness reported**      Mobility  Bed Mobility Bed Mobility: Rolling Left;Supine to Sit;Sit to Supine Rolling Left: 1: +2 Total assist Rolling Left: Patient Percentage: 10% Supine to Sit: 1: +2 Total assist Supine to Sit: Patient Percentage: 10% Sit to Supine: 1: +2 Total assist Sit to Supine: Patient  Percentage: 0% Details for Bed Mobility Assistance: All movement limited by pain; Pt sat on EOB 10 minutes with total assist for sitting balance 2* posterior and right lateral lean. Dizziness reported in sitting which did not resolve, increased with turning head and lifting head. VSS. Transfers Transfers: Not assessed    Exercises Total Joint Exercises Ankle Circles/Pumps: AROM;Both;5 reps;Supine Heel Slides: AAROM;Left;10 reps;Supine Hip ABduction/ADduction: AAROM;Left;10 reps;Supine   PT Diagnosis: Difficulty walking;Generalized weakness;Acute pain  PT Problem List: Decreased strength;Decreased activity tolerance;Decreased balance;Decreased mobility;Pain PT Treatment Interventions: Gait training;Functional mobility training;DME instruction;Therapeutic activities;Therapeutic exercise;Balance training;Patient/family education     PT Goals(Current goals can be found in the care plan section) Acute Rehab PT Goals Patient Stated Goal: " I don't think I can do the therapy. Am I required to do the therapy? I really don't want to." PT Goal Formulation: With patient/family Time For Goal Achievement: 06/22/13 Potential to Achieve Goals: Fair  Visit Information  Last PT Received On: 06/08/13 Assistance Needed: +2 PT/OT Co-Evaluation/Treatment: Yes History of Present Illness: 79 y.o. female admitted for L hip fx following a fall. Pt reports hitting her head on concrete.       Prior Functioning  Home Living Family/patient expects to be discharged to:: Private residence Living Arrangements: Alone Available Help at Discharge: Family;Available 24 hours/day (daughter lives next door and assists as needed) Type of Home: House Home Access: Stairs to enter [employer] of Steps: 3 in front , 4 in back; ; 1 handrail in front, one in back Entrance Stairs-Rails: Right Home Layout: One level Home Equipment: Grab bars - tub/shower;Cane - single point;Bedside commode;Shower seat;Walker -  2  wheels;Wheelchair - [employer] - 4 wheels Additional Comments: tub, handicapped height commode Prior Function Level of Independence: Independent with assistive device(s) Comments: independent with cooking and cleaning Communication Communication: No difficulties    Cognition  Cognition Arousal/Alertness: Lethargic;Awake/alert Behavior During Therapy: WFL for tasks assessed/performed Overall Cognitive Status: Within Functional Limits for tasks assessed    Extremity/Trunk Assessment Upper Extremity Assessment Upper Extremity Assessment: Generalized weakness (UE tremor noted R hand) Lower Extremity Assessment Lower Extremity Assessment: LLE deficits/detail LLE Deficits / Details: limited by pain, hip ABD 10* AAROM, flexion 40* AAROM; ankle WNL LLE Sensation: history of peripheral neuropathy Cervical / Trunk Assessment Cervical / Trunk Assessment: Kyphotic   Balance    End of Session PT - End of Session Activity Tolerance: Patient limited by pain;Patient limited by fatigue Patient left: in bed;with call bell/phone within reach;with family/visitor present Nurse Communication: Mobility status  GP     Ralene Bathe Kistler 06/08/2013, 9:51 AM [phone]

## 2013-06-08 NOTE — Progress Notes (Signed)
Clinical Social Work Department CLINICAL SOCIAL WORK PLACEMENT NOTE 06/08/2013  Patient:  Mikayla Villanueva,Mikayla Villanueva  Account Number:  [id number] Admit date:  06/05/2013  Clinical Social Worker:  Unk Lightning, LCSW  Date/time:  06/08/2013 01:00 PM  Clinical Social Work is seeking post-discharge placement for this patient at the following level of care:   SKILLED NURSING   (*CSW will update this form in Epic as items are completed)   06/08/2013  Patient/family provided with [facility] Department of Clinical Social Work's list of facilities offering this level of care within the geographic area requested by the patient (or if unable, by the patient's family).  06/08/2013  Patient/family informed of their freedom to choose among providers that offer the needed level of care, that participate in Medicare, Medicaid or managed care program needed by the patient, have an available bed and are willing to accept the patient.  06/08/2013  Patient/family informed of [facility]' ownership interest in [facility], as well as of the fact that they are under no obligation to receive care at this facility.  PASARR submitted to EDS on 06/08/2013 PASARR number received from EDS on 06/08/2013  FL2 transmitted to all facilities in geographic area requested by pt/family on  06/08/2013 FL2 transmitted to all facilities within larger geographic area on   Patient informed that his/her managed care company has contracts with or will negotiate with  certain facilities, including the following:     Patient/family informed of bed offers received:   Patient chooses bed at  Physician recommends and patient chooses bed at    Patient to be transferred to  on   Patient to be transferred to facility by   The following physician request were entered in Epic:   Additional Comments:

## 2013-06-09 LAB — CBC
HCT: 31.3 % — ABNORMAL LOW (ref 36.0–46.0)
MCH: 31.6 pg (ref 26.0–34.0)
MCV: 95.1 fL (ref 78.0–100.0)
Platelets: 192 10*3/uL (ref 150–400)
RDW: 13.7 % (ref 11.5–15.5)
WBC: 12.4 10*3/uL — ABNORMAL HIGH (ref 4.0–10.5)

## 2013-06-09 LAB — URINE CULTURE: Colony Count: >=100000

## 2013-06-09 LAB — BASIC METABOLIC PANEL
BUN: 31 mg/dL — ABNORMAL HIGH (ref 6–23)
Chloride: 99 mEq/L (ref 96–112)
Creatinine, Ser: 0.59 mg/dL (ref 0.50–1.10)
GFR calc Af Amer: >90 mL/min (ref >90)

## 2013-06-09 MED ORDER — SODIUM CHLORIDE 0.9 % IV SOLN
INTRAVENOUS | Status: DC
  Administered 2013-06-09: 100 mL/h via INTRAVENOUS

## 2013-06-09 MED ORDER — ENOXAPARIN SODIUM 40 MG/0.4ML ~~LOC~~ SOLN: 40.0000 mg | SUBCUTANEOUS | Status: DC

## 2013-06-09 MED ORDER — LEVOFLOXACIN IN D5W 500 MG/100ML IV SOLN
500.0000 mg | Freq: Once | INTRAVENOUS | Status: AC
  Administered 2013-06-09: 500 mg via INTRAVENOUS
  Filled 2013-06-09: qty 100

## 2013-06-09 MED ORDER — LEVOFLOXACIN IN D5W 250 MG/50ML IV SOLN
250.0000 mg | INTRAVENOUS | Status: DC
  Administered 2013-06-10: 11:00:00 250 mg via INTRAVENOUS
  Filled 2013-06-09: qty 50

## 2013-06-09 MED ORDER — GABAPENTIN 300 MG PO CAPS
300.0000 mg | ORAL_CAPSULE | Freq: Two times a day (BID) | ORAL | Status: DC
  Administered 2013-06-09 (×2): 300 mg via ORAL
  Filled 2013-06-09 (×4): qty 1

## 2013-06-09 MED ORDER — TRAMADOL HCL 50 MG PO TABS: 50.0000 mg | ORAL_TABLET | Freq: Four times a day (QID) | ORAL | Status: DC | PRN

## 2013-06-09 MED ORDER — HYDROCODONE-ACETAMINOPHEN 5-325 MG PO TABS: 1.0000 | ORAL_TABLET | Freq: Four times a day (QID) | ORAL | Status: DC | PRN

## 2013-06-09 MED ORDER — METHOCARBAMOL 500 MG PO TABS: 500.0000 mg | ORAL_TABLET | Freq: Four times a day (QID) | ORAL | Status: DC | PRN

## 2013-06-09 NOTE — Progress Notes (Signed)
[facility] PROGRESS NOTE  Mikayla Villanueva ZOX:[id number] DOB: 1920/08/06 DOA: 06/05/2013 PCP: Joycelyn Rua, MD  Assessment/Plan: Left hip fracture  - Management per Orthopaedic surgery  - Patient is status post Left hip hemiarthroplasty on 06/07/2013 Dr. Ollen Gross (Orthopedic Surgeon).  -Physical therapy/OT; recommend SNF; will hold on discharge to SNF secondary to patient's UTI; patient and daughter agree with this plan   HTN  - BP currently within [facility] guidelines continue to hold Lisinopril  - Continue Toprol XL 50 mg daily. Will consider adding lisinopril if blood pressure remaining elevated   UTI  - Will DC Rocephin -Start levofloxacin IV (culture showing enterococcus/viridans strep) -Will start IV normal saline 156ml/hr to ensure patient hydrated properly   COPD  - continue Spiriva and albuterol  - stable and compensated   Hypothyroidism  - Continue Levothyroxine Daily   Neuropathy lower extremity  -Patient did well with Neurontin QHS, so we'll increase dose to BID   Code Status: Full Family Communication: Daughter was present for discussion of plan of care Disposition Plan:    Procedures:  Negative MRSA by PCR 06/06/2013  Urine culture 06/05/2013, positive for enterococcus species and Viridans Streptococcus   Consultations:  Orthopedic surgery (Dr. Ollen Gross)    Antibiotics:  Levofloxacin 11/25    HPI/Subjective: 79yo WF PMHx hypothyroidism, hypertension, hyperlipidemia, diverticulitis who presented to the ED after a fall. X-ray of hip which showed left femoral neck fracture with varus angulation and impaction. Left hip hemiarthroplasty on 06/07/2013 Dr. Ollen Gross (Orthopedic Surgeon). 06/08/2013 patient mildly sleepy (just taken tramadol x2 per family) answers all questions appropriately. States was too tired and weak to participate fully with physical therapy today. States her normal neuropathy in her bilateral lower ankles and feet is  worse and has radiated upward in her legs. Describes pain as burning/pins and needles. States has never been treated for the neuropathy and unsure of the cause. TODAY states feeling very fatigued, positive abdominal pain, negative CP, negative SOB, negative back pain. States did get a good nights sleep with Neurontin for her neuropathy however the leg pain has returned today and requests we increased the dosage.   Objective: Filed Vitals:   06/08/13 2118 06/09/13 0530 06/09/13 0906 06/09/13 0907  BP: 124/73 101/65 131/72   Pulse: 86 84 94   Temp: 97.7 F (36.5 C) 99.6 F (37.6 C)    TempSrc: Oral Oral    Resp: 18 16    Height:      Weight:      SpO2: 94% 95%  97%    Intake/Output Summary (Last 24 hours) at 06/09/13 1321 Last data filed at 06/09/13 1019  Gross per 24 hour  Intake 2562.5 ml  Output    850 ml  Net 1712.5 ml   Filed Weights   06/05/13 2227  Weight: 68.04 kg (150 lb)    Exam: General: Somewhat fatigued,  A./O. x4, NAD  Cardiovascular: Regular rhythm and rate, negative murmurs rubs or gallops, DP/PT pulse one plus bilateral  Respiratory: Clear to auscultation bilateral  Abdomen; positive suprapubic area tenderness to palpation Musculoskeletal; pain to palpation bilateral lower feet ankles shin. Left hip lateral aspect incision has a clean dry dressing, negative erythema, negative sign of infection. Negative CVA tenderness  Data Reviewed: Basic Metabolic Panel:  Recent Labs Lab 06/05/13 1648 06/06/13 0422 06/08/13 0505 06/09/13 0418  NA 136 133* 135 132*  K 4.2 3.8 3.9 4.5  CL 98 98 98 99  CO2 24 26 28  27  GLUCOSE 126* 171* 137* 140*  BUN 34* 25* 18 31*  CREATININE 0.75 0.70 0.59 0.59  CALCIUM 9.3 8.3* 8.3* 8.0*   Liver Function Tests:  Recent Labs Lab 06/05/13 1648  AST 29  ALT 23  ALKPHOS 78  BILITOT 0.5  PROT 8.1  ALBUMIN 4.4   No results found for this basename: LIPASE, AMYLASE,  in the last 168 hours No results found for this  basename: AMMONIA,  in the last 168 hours CBC:  Recent Labs Lab 06/05/13 1648 06/06/13 0422 06/07/13 0450 06/08/13 0505 06/09/13 0418  WBC 10.2 13.6* 11.5* 12.2* 12.4*  NEUTROABS  --  9.9*  --   --   --   HGB 14.2 12.9 14.0 12.6 10.4*  HCT 43.7 38.8 42.0 38.0 31.3*  MCV 94.8 94.6 95.0 94.5 95.1  PLT 243 231 216 206 192   Cardiac Enzymes: No results found for this basename: CKTOTAL, CKMB, CKMBINDEX, TROPONINI,  in the last 168 hours BNP (last 3 results) No results found for this basename: PROBNP,  in the last 8760 hours CBG: No results found for this basename: GLUCAP,  in the last 168 hours  Recent Results (from the past 240 hour(s))  URINE CULTURE     Status: None   Collection Time    06/05/13  5:49 PM      Result Value Range Status   Specimen Description URINE, CATHETERIZED   Final   Special Requests NONE   Final   Culture  Setup Time     Final   Value: 06/06/2013 03:16     Performed at [facility] Count     Final   Value: >=100,000 COLONIES/ML     Performed at [facility]   Culture     Final   Value: ENTEROCOCCUS SPECIES     VIRIDANS STREPTOCOCCUS     Performed at [facility]   Report Status 06/09/2013 FINAL   Final   Organism ID, Bacteria ENTEROCOCCUS SPECIES   Final  SURGICAL PCR SCREEN     Status: Abnormal   Collection Time    06/06/13  5:55 PM      Result Value Range Status   MRSA, PCR NEGATIVE  NEGATIVE Final   Staphylococcus aureus POSITIVE (*) NEGATIVE Final   Comment:            The Xpert SA Assay (FDA     approved for NASAL specimens     in patients over 18 years of age),     is one component of     a comprehensive surveillance     program.  Test performance has     been validated by [employer] for patients greater     than or equal to 76 year old.     It is not intended     to diagnose infection nor to     guide or monitor treatment.     Studies: No results found.  Scheduled Meds: . docusate  sodium  100 mg Oral BID  . enoxaparin (LOVENOX) injection  40 mg Subcutaneous Q24H  . feeding supplement (ENSURE COMPLETE)  237 mL Oral TID BM  . gabapentin  300 mg Oral QHS  . [START ON 06/10/2013] levofloxacin (LEVAQUIN) IV  250 mg Intravenous Q24H  . levofloxacin (LEVAQUIN) IV  500 mg Intravenous Once  . levothyroxine  88 mcg Oral QAC breakfast  . metoprolol succinate  50 mg Oral Daily  .  tiotropium  18 mcg Inhalation Daily   Continuous Infusions: . 0.9 % NaCl with KCl 20 mEq / L 75 mL/hr at 06/09/13 4098    Active Problems:   Closed left hip fracture   Hypertension   COPD (chronic obstructive pulmonary disease)   UTI (lower urinary tract infection)   MRSA nasal colonization   Unspecified hereditary and idiopathic peripheral neuropathy    Time spent: 40 minutes   WOODS, CURTIS, J  [facility] Pager [phone]. If 7PM-7AM, please contact night-coverage at [email or web address], password [facility] 06/09/2013, 1:21 PM  LOS: 4 days

## 2013-06-09 NOTE — Progress Notes (Signed)
PT Cancellation Note  ___Treatment cancelled today due to medical issues with patient which prohibited therapy  ___ Treatment cancelled today due to patient receiving procedure or test   ___ Treatment cancelled today due to patient's refusal to participate   _X_ Treatment cancelled today due to pt's inability to tolerate and decreased mental status to self perform OOB activity.  [name]  PTA [facility]  Acute  Rehab Pager      [phone]

## 2013-06-09 NOTE — Progress Notes (Signed)
06/09/2013 Colleen Can BSN RN CCM [phone] Discharge plans are currently for  SNF rehab. CM signing off .

## 2013-06-09 NOTE — Progress Notes (Addendum)
   Subjective: 2 Days Post-Op Procedure(s) (LRB): ARTHROPLASTY BIPOLAR HIP (Left) Patient reports pain as mild.   Patient seen in rounds by Dr. Lequita Halt. Patient is well, but has had some minor complaints of pain in the hip, requiring pain medications Patient is ready to go to the SNF for further rehab.  Objective: Vital signs in last 24 hours: Temp:  [97.7 F (36.5 C)-99.6 F (37.6 C)] 99.6 F (37.6 C) (11/25 0530) Pulse Rate:  [84-87] 84 (11/25 0530) Resp:  [16-18] 16 (11/25 0530) BP: (101-154)/(58-84) 101/65 mmHg (11/25 0530) SpO2:  [94 %-95 %] 95 % (11/25 0530)  Intake/Output from previous day:  Intake/Output Summary (Last 24 hours) at 06/09/13 0846 Last data filed at 06/09/13 0530  Gross per 24 hour  Intake   2150 ml  Output    750 ml  Net   1400 ml     Labs:  Recent Labs  06/07/13 0450 06/08/13 0505 06/09/13 0418  HGB 14.0 12.6 10.4*    Recent Labs  06/08/13 0505 06/09/13 0418  WBC 12.2* 12.4*  RBC 4.02 3.29*  HCT 38.0 31.3*  PLT 206 192    Recent Labs  06/08/13 0505 06/09/13 0418  NA 135 132*  K 3.9 4.5  CL 98 99  CO2 28 27  BUN 18 31*  CREATININE 0.59 0.59  GLUCOSE 137* 140*  CALCIUM 8.3* 8.0*   No results found for this basename: LABPT, INR,  in the last 72 hours  EXAM: General - Patient is Alert and Appropriate Extremity - Neurovascular intact Sensation intact distally Incision - clean, dry, healing Motor Function - intact, moving foot and toes well on exam.   Assessment/Plan: 2 Days Post-Op Procedure(s) (LRB): ARTHROPLASTY BIPOLAR HIP (Left) Procedure(s) (LRB): ARTHROPLASTY BIPOLAR HIP (Left) Past Medical History  Diagnosis Date  . Thyroid disease   . Hypertension   . Hyperlipemia   . Diverticulitis   . Arthritis   . Osteoporosis   . Hypothyroidism   . Hypercholesteremia    Active Problems:   Closed left hip fracture   Hypertension   COPD (chronic obstructive pulmonary disease)   UTI (lower urinary tract  infection)   MRSA nasal colonization   Unspecified hereditary and idiopathic peripheral neuropathy  Estimated body mass index is 25.73 kg/(m^2) as calculated from the following:   Height as of this encounter: 5\' 4"  (1.626 m).   Weight as of this encounter: 68.04 kg (150 lb). Discharge to SNF Follow up - in 2 weeks with Dr. Lequita Halt Activity - WBAT Disposition - Skilled nursing facility Condition Upon Discharge - Stable D/C Meds - See DC Summary DVT Prophylaxis - Lovenox for a total of 10 days and then baby aspirin 81 mg for 4 more weeks.  RX for pain meds and Lovenox placed on chart.  Patrica Duel 06/09/2013, 8:46 AM

## 2013-06-10 DIAGNOSIS — E039 Hypothyroidism, unspecified: Secondary | ICD-10-CM | POA: Diagnosis present

## 2013-06-10 LAB — COMPREHENSIVE METABOLIC PANEL
ALT: 40 U/L — ABNORMAL HIGH (ref 0–35)
Alkaline Phosphatase: 84 U/L (ref 39–117)
BUN: 21 mg/dL (ref 6–23)
CO2: 26 mEq/L (ref 19–32)
Chloride: 97 mEq/L (ref 96–112)
GFR calc Af Amer: >90 mL/min (ref >90)
GFR calc non Af Amer: 79 mL/min — ABNORMAL LOW (ref >90)
Glucose, Bld: 169 mg/dL — ABNORMAL HIGH (ref 70–99)
Potassium: 4.2 mEq/L (ref 3.5–5.1)
Sodium: 132 mEq/L — ABNORMAL LOW (ref 135–145)
Total Bilirubin: 0.5 mg/dL (ref 0.3–1.2)
Total Protein: 5.9 g/dL — ABNORMAL LOW (ref 6.0–8.3)

## 2013-06-10 LAB — CBC
HCT: 27.2 % — ABNORMAL LOW (ref 36.0–46.0)
Hemoglobin: 9.1 g/dL — ABNORMAL LOW (ref 12.0–15.0)
MCHC: 33.5 g/dL (ref 30.0–36.0)
Platelets: 185 10*3/uL (ref 150–400)
RBC: 2.89 MIL/uL — ABNORMAL LOW (ref 3.87–5.11)
WBC: 10 10*3/uL (ref 4.0–10.5)

## 2013-06-10 MED ORDER — HYDROCODONE-ACETAMINOPHEN 5-325 MG PO TABS: 1.0000 | ORAL_TABLET | Freq: Four times a day (QID) | ORAL | Status: DC | PRN

## 2013-06-10 MED ORDER — ENOXAPARIN SODIUM 40 MG/0.4ML ~~LOC~~ SOLN: 40.0000 mg | SUBCUTANEOUS | Status: DC

## 2013-06-10 MED ORDER — METHOCARBAMOL 500 MG PO TABS: 500.0000 mg | ORAL_TABLET | Freq: Four times a day (QID) | ORAL | Status: DC | PRN

## 2013-06-10 MED ORDER — LEVOFLOXACIN 250 MG PO TABS: 250.0000 mg | ORAL_TABLET | Freq: Every day | ORAL | Status: DC

## 2013-06-10 NOTE — Progress Notes (Signed)
Physical Therapy Treatment Patient Details Name: Mikayla Villanueva MRN: [id number] DOB: 11-Jun-1946 Today's Date: 06/10/2013 Time: 4098-1191 PT Time Calculation (min): 18 min  PT Assessment / Plan / Recommendation  History of Present Illness 79 y.o. female admitted for L hip fx following a fall. Pt reports hitting her head on concrete.   PT Comments   Assisted back to bed + 2 total assist.  Increased time to position to comfort.   Follow Up Recommendations  SNF     Does the patient have the potential to tolerate intense rehabilitation     Barriers to Discharge        Equipment Recommendations  None recommended by PT    Recommendations for Other Services    Frequency Min 5X/week   Progress towards PT Goals Progress towards PT goals: Progressing toward goals  Plan      Precautions / Restrictions Precautions Precautions: Fall;Posterior Hip Precaution Comments: Pt unable to recall any THP so re educated Restrictions Weight Bearing Restrictions: No Other Position/Activity Restrictions: WBAT    Pertinent Vitals/Pain C/o fatigue    Mobility  Bed Mobility Bed Mobility: Sit to Supine Supine to Sit: 1: +2 Total assist Supine to Sit: Patient Percentage: 20% Sit to Supine: 1: +2 Total assist Sit to Supine: Patient Percentage: 10% Details for Bed Mobility Assistance: Assisted pt back to bed + 2 total assist and increased time to position to comfort. Transfers Transfers: Sit to Stand;Stand to Sit Sit to Stand: 1: +2 Total assist;From chair/3-in-1 Sit to Stand: Patient Percentage: 20% Stand to Sit: 1: +2 Total assist;To bed Details for Transfer Assistance: 75% VC's on proper tech and hand placement to increase self assist Ambulation/Gait Ambulation/Gait Assistance: 1: +2 Total assist Ambulation/Gait: Patient Percentage: 30% Ambulation Distance (Feet): 18 Feet Assistive device: Rolling walker Ambulation/Gait Assistance Details: assisted from recliner to bed only this session due to  MAX c/o fatigue Gait Pattern: Step-to pattern;Decreased stance time - left Gait velocity: decreased    PT Goals (current goals can now be found in the care plan section)    Visit Information  Last PT Received On: 06/10/13 Assistance Needed: +2 History of Present Illness: 79 y.o. female admitted for L hip fx following a fall. Pt reports hitting her head on concrete.    Subjective Data      Cognition       Balance     End of Session PT - End of Session Equipment Utilized During Treatment: Gait belt Activity Tolerance: Patient limited by fatigue Patient left: in bed;with call bell/phone within reach;with family/visitor present Nurse Communication: Mobility status;Need for lift equipment   [name]  PTA [facility]  Acute  Rehab Pager      [phone]

## 2013-06-10 NOTE — Progress Notes (Signed)
Patient is set to discharge to [facility] today. Discharge packet at nurses station - RN, Amy aware. Patient aware - [facility] scheduled for 1:00 pickup.   Clinical Social Work Department CLINICAL SOCIAL WORK PLACEMENT NOTE 06/10/2013  Patient:  Mikayla Villanueva,Mikayla Villanueva  Account Number:  [id number] Admit date:  06/05/2013  Clinical Social Worker:  Unk Lightning, LCSW  Date/time:  06/08/2013 01:00 PM  Clinical Social Work is seeking post-discharge placement for this patient at the following level of care:   SKILLED NURSING   (*CSW will update this form in Epic as items are completed)   06/08/2013  Patient/family provided with [facility] Department of Clinical Social Work's list of facilities offering this level of care within the geographic area requested by the patient (or if unable, by the patient's family).  06/08/2013  Patient/family informed of their freedom to choose among providers that offer the needed level of care, that participate in Medicare, Medicaid or managed care program needed by the patient, have an available bed and are willing to accept the patient.  06/08/2013  Patient/family informed of [facility]' ownership interest in [facility], as well as of the fact that they are under no obligation to receive care at this facility.  PASARR submitted to EDS on 06/08/2013 PASARR number received from EDS on 06/08/2013  FL2 transmitted to all facilities in geographic area requested by pt/family on  06/08/2013 FL2 transmitted to all facilities within larger geographic area on   Patient informed that his/her managed care company has contracts with or will negotiate with  certain facilities, including the following:     Patient/family informed of bed offers received:  06/09/2013 Patient chooses bed at [place], [facility] Physician recommends and patient chooses bed at    Patient to be transferred to [place], [facility] on  06/10/2013 Patient to be  transferred to facility by PTAR  The following physician request were entered in Epic:   Additional Comments:   Unice Bailey, LCSW [facility] Clinical Social Worker cell #: [phone]

## 2013-06-10 NOTE — Progress Notes (Signed)
   Subjective: 3 Days Post-Op Procedure(s) (LRB): ARTHROPLASTY BIPOLAR HIP (Left) Patient reports pain as mild.   Patient seen in rounds for Dr. Lequita Halt.  She is doing better. Patient is well, and has had no acute complaints or problems Plan is to go Skilled nursing facility after hospital stay.  Objective: Vital signs in last 24 hours: Temp:  [98.1 F (36.7 C)-98.7 F (37.1 C)] 98.7 F (37.1 C) (11/26 0526) Pulse Rate:  [82-85] 85 (11/26 1027) Resp:  [16] 16 (11/26 0526) BP: (103-162)/(62-84) 120/70 mmHg (11/26 1027) SpO2:  [93 %-96 %] 93 % (11/26 0526)  Intake/Output from previous day:  Intake/Output Summary (Last 24 hours) at 06/10/13 1214 Last data filed at 06/10/13 1045  Gross per 24 hour  Intake 1506.67 ml  Output   1975 ml  Net -468.33 ml    Intake/Output this shift: Total I/O In: 240 [P.O.:240] Out: 800 [Urine:800]  Labs:  Recent Labs  06/08/13 0505 06/09/13 0418 06/10/13 0417  HGB 12.6 10.4* 9.1*    Recent Labs  06/09/13 0418 06/10/13 0417  WBC 12.4* 10.0  RBC 3.29* 2.89*  HCT 31.3* 27.2*  PLT 192 185    Recent Labs  06/09/13 0418 06/10/13 1100  NA 132* 132*  K 4.5 4.2  CL 99 97  CO2 27 26  BUN 31* 21  CREATININE 0.59 0.58  GLUCOSE 140* 169*  CALCIUM 8.0* 8.0*   No results found for this basename: LABPT, INR,  in the last 72 hours  EXAM General - Patient is Alert and Appropriate Extremity - Neurovascular intact Sensation intact distally Dressing/Incision - clean, dry Motor Function - intact, moving foot and toes well on exam.   Past Medical History  Diagnosis Date  . Thyroid disease   . Hypertension   . Hyperlipemia   . Diverticulitis   . Arthritis   . Osteoporosis   . Hypothyroidism   . Hypercholesteremia     Assessment/Plan: 3 Days Post-Op Procedure(s) (LRB): ARTHROPLASTY BIPOLAR HIP (Left) Active Problems:   Closed left hip fracture   Hypertension   COPD (chronic obstructive pulmonary disease)   UTI (lower  urinary tract infection)   MRSA nasal colonization   Unspecified hereditary and idiopathic peripheral neuropathy   Unspecified hypothyroidism  Estimated body mass index is 25.73 kg/(m^2) as calculated from the following:   Height as of this encounter: 5\' 4"  (1.626 m).   Weight as of this encounter: 68.04 kg (150 lb).  Discharge to SNF  Follow up - in 2 weeks with Dr. Lequita Halt  Activity - WBAT  Disposition - Skilled nursing facility  Condition Upon Discharge - Stable  D/C Meds - See DC Summary  DVT Prophylaxis - Lovenox for a total of 10 days and then baby aspirin 81 mg for 4 more weeks.  RX for pain meds and Lovenox printed out for facility and given to Child psychotherapist.  PERKINS, ALEXZANDREW 06/10/2013, 12:14 PM

## 2013-06-10 NOTE — Progress Notes (Signed)
Physical Therapy Treatment Patient Details Name: Sherida Hardrick MRN: [id number] DOB: 1921-01-19 Today's Date: 06/10/2013 Time: 4098-1191 PT Time Calculation (min): 29 min  PT Assessment / Plan / Recommendation  History of Present Illness 79 y.o. female admitted for L hip fx following a fall. Pt reports hitting her head on concrete.   PT Comments   Assisted Pt OOB to amb limited distance.  Pt requires + 2 total assist and increased time.  Pt progressing slowly and plans to D/C to [facility].    Follow Up Recommendations  SNF     Does the patient have the potential to tolerate intense rehabilitation     Barriers to Discharge        Equipment Recommendations  None recommended by PT    Recommendations for Other Services    Frequency Min 5X/week   Progress towards PT Goals Progress towards PT goals: Progressing toward goals  Plan      Precautions / Restrictions Precautions Precautions: Fall;Posterior Hip Precaution Comments: Pt unable to recall any THP so re educated Restrictions Weight Bearing Restrictions: No Other Position/Activity Restrictions: WBAT    Pertinent Vitals/Pain C/o "right much" hip pain with act meds requested ICE applied    Mobility  Bed Mobility Bed Mobility: Supine to Sit Supine to Sit: 1: +2 Total assist Supine to Sit: Patient Percentage: 20% Details for Bed Mobility Assistance: Pt required + 2 totat assist and increased time to complete task. Transfers Transfers: Sit to Stand;Stand to Sit Sit to Stand: 1: +2 Total assist;From bed;From elevated surface Sit to Stand: Patient Percentage: 20% Stand to Sit: 1: +2 Total assist;To chair/3-in-1 Details for Transfer Assistance: 75% VC's on proper tech and hand placement to increase self assist Ambulation/Gait Ambulation/Gait Assistance: 1: +2 Total assist Ambulation/Gait: Patient Percentage: 30% Ambulation Distance (Feet): 18 Feet Assistive device: Rolling walker Ambulation/Gait Assistance  Details: increased time and 75% VC's on proper sequencing.  Increased time and increased anxillary suoort to assist with weight shifting.  Gait Pattern: Step-to pattern;Decreased stance time - left Gait velocity: decreased    Exercises   Total Hip Replacement TE's 10 reps ankle pumps 10 reps knee presses 10 reps heel slides 10 reps SAQ's 10 reps ABD Followed by ICE    PT Goals (current goals can now be found in the care plan section)    Visit Information  Last PT Received On: 06/10/13 Assistance Needed: +2 History of Present Illness: 79 y.o. female admitted for L hip fx following a fall. Pt reports hitting her head on concrete.    Subjective Data      Cognition       Balance     End of Session PT - End of Session Equipment Utilized During Treatment: Gait belt Activity Tolerance: Patient limited by pain Patient left: in chair;with family/visitor present;with call bell/phone within reach Nurse Communication: Mobility status;Need for lift equipment   [name]  PTA [facility]  Acute  Rehab Pager      [phone]

## 2013-10-06 ENCOUNTER — Other Ambulatory Visit: Payer: Self-pay | Admitting: Orthopedic Surgery

## 2013-10-30 ENCOUNTER — Emergency Department (HOSPITAL_COMMUNITY)
Admission: EM | Admit: 2013-10-30 | Discharge: 2013-10-30 | Disposition: A | Discharge status: Home / Self Care | Payer: Medicare Other | Attending: Emergency Medicine | Admitting: Emergency Medicine

## 2013-10-30 ENCOUNTER — Encounter (HOSPITAL_COMMUNITY): Payer: Self-pay | Admitting: Emergency Medicine

## 2013-10-30 ENCOUNTER — Emergency Department (HOSPITAL_COMMUNITY): Payer: Medicare Other

## 2013-10-30 DIAGNOSIS — E785 Hyperlipidemia, unspecified: Secondary | ICD-10-CM | POA: Insufficient documentation

## 2013-10-30 DIAGNOSIS — E039 Hypothyroidism, unspecified: Secondary | ICD-10-CM | POA: Insufficient documentation

## 2013-10-30 DIAGNOSIS — E7800 Pure hypercholesterolemia, unspecified: Secondary | ICD-10-CM | POA: Insufficient documentation

## 2013-10-30 DIAGNOSIS — I10 Essential (primary) hypertension: Secondary | ICD-10-CM | POA: Insufficient documentation

## 2013-10-30 DIAGNOSIS — M546 Pain in thoracic spine: Secondary | ICD-10-CM | POA: Insufficient documentation

## 2013-10-30 DIAGNOSIS — J4489 Other specified chronic obstructive pulmonary disease: Secondary | ICD-10-CM | POA: Insufficient documentation

## 2013-10-30 DIAGNOSIS — N390 Urinary tract infection, site not specified: Secondary | ICD-10-CM | POA: Insufficient documentation

## 2013-10-30 DIAGNOSIS — J449 Chronic obstructive pulmonary disease, unspecified: Secondary | ICD-10-CM | POA: Insufficient documentation

## 2013-10-30 DIAGNOSIS — M129 Arthropathy, unspecified: Secondary | ICD-10-CM | POA: Insufficient documentation

## 2013-10-30 DIAGNOSIS — Z8719 Personal history of other diseases of the digestive system: Secondary | ICD-10-CM | POA: Insufficient documentation

## 2013-10-30 DIAGNOSIS — Z79899 Other long term (current) drug therapy: Secondary | ICD-10-CM | POA: Insufficient documentation

## 2013-10-30 LAB — CBC WITH DIFFERENTIAL/PLATELET
Basophils Absolute: 0 10*3/uL (ref 0.0–0.1)
Basophils Relative: 0 % (ref 0–1)
EOS PCT: 1 % (ref 0–5)
Eosinophils Absolute: 0.1 10*3/uL (ref 0.0–0.7)
HEMATOCRIT: 40.1 % (ref 36.0–46.0)
Hemoglobin: 13.3 g/dL (ref 12.0–15.0)
Lymphocytes Relative: 17 % (ref 12–46)
Lymphs Abs: 1.9 10*3/uL (ref 0.7–4.0)
MCH: 30.7 pg (ref 26.0–34.0)
MCHC: 33.2 g/dL (ref 30.0–36.0)
MCV: 92.6 fL (ref 78.0–100.0)
Monocytes Absolute: 1 10*3/uL (ref 0.1–1.0)
Monocytes Relative: 9 % (ref 3–12)
Neutro Abs: 8 10*3/uL — ABNORMAL HIGH (ref 1.7–7.7)
Neutrophils Relative %: 73 % (ref 43–77)
PLATELETS: 262 10*3/uL (ref 150–400)
RBC: 4.33 MIL/uL (ref 3.87–5.11)
RDW: 14.4 % (ref 11.5–15.5)
WBC: 11 10*3/uL — ABNORMAL HIGH (ref 4.0–10.5)

## 2013-10-30 LAB — URINE MICROSCOPIC-ADD ON

## 2013-10-30 LAB — URINALYSIS, ROUTINE W REFLEX MICROSCOPIC
Bilirubin Urine: NEGATIVE
GLUCOSE, UA: NEGATIVE mg/dL
HGB URINE DIPSTICK: NEGATIVE
Ketones, ur: NEGATIVE mg/dL
Nitrite: POSITIVE — AB
Protein, ur: NEGATIVE mg/dL
SPECIFIC GRAVITY, URINE: 1.013 (ref 1.005–1.030)
Urobilinogen, UA: 0.2 mg/dL (ref 0.0–1.0)
pH: 7 (ref 5.0–8.0)

## 2013-10-30 LAB — BASIC METABOLIC PANEL
BUN: 25 mg/dL — ABNORMAL HIGH (ref 6–23)
CALCIUM: 8.8 mg/dL (ref 8.4–10.5)
CO2: 27 meq/L (ref 19–32)
Chloride: 98 mEq/L (ref 96–112)
Creatinine, Ser: 0.67 mg/dL (ref 0.50–1.10)
GFR calc Af Amer: 87 mL/min — ABNORMAL LOW (ref >90)
GFR calc non Af Amer: 75 mL/min — ABNORMAL LOW (ref >90)
Glucose, Bld: 157 mg/dL — ABNORMAL HIGH (ref 70–99)
POTASSIUM: 4 meq/L (ref 3.7–5.3)
SODIUM: 137 meq/L (ref 137–147)

## 2013-10-30 MED ORDER — CEPHALEXIN 500 MG PO CAPS: 500.0000 mg | ORAL_CAPSULE | Freq: Two times a day (BID) | ORAL | Status: DC

## 2013-10-30 MED ORDER — SODIUM CHLORIDE 0.9 % IV BOLUS (SEPSIS)
500.0000 mL | Freq: Once | INTRAVENOUS | Status: AC
  Administered 2013-10-30: 500 mL via INTRAVENOUS

## 2013-10-30 MED ORDER — HYDROCODONE-ACETAMINOPHEN 5-325 MG PO TABS: 1.0000 | ORAL_TABLET | ORAL | Status: DC | PRN

## 2013-10-30 MED ORDER — IBUPROFEN 200 MG PO TABS
400.0000 mg | ORAL_TABLET | Freq: Once | ORAL | Status: AC
  Administered 2013-10-30: 400 mg via ORAL
  Filled 2013-10-30: qty 2

## 2013-10-30 MED ORDER — HYDROCODONE-ACETAMINOPHEN 5-325 MG PO TABS
1.0000 | ORAL_TABLET | Freq: Once | ORAL | Status: AC
  Administered 2013-10-30: 1 via ORAL
  Filled 2013-10-30: qty 1

## 2013-10-30 MED ORDER — DEXTROSE 5 % IV SOLN
1.0000 g | Freq: Once | INTRAVENOUS | Status: AC
Start: 2013-10-30 — End: 2013-10-30
  Administered 2013-10-30: 1 g via INTRAVENOUS
  Filled 2013-10-30: qty 10

## 2013-10-30 NOTE — ED Provider Notes (Signed)
CSN: [id number]     Arrival date & time 10/30/13  1236 History   First MD Initiated Contact with Patient 10/30/13 1416     Chief Complaint  Patient presents with  . Back Pain     (Consider location/radiation/quality/duration/timing/severity/associated sxs/prior Treatment) HPI Comments: [age] year old female with history of high blood pressure, COPD, UTI, diverticulitis presents with worsening mid back pain for the past few weeks. Worse with walking and movement. Similar to previous but more severe. Patient has been taking tramadol for pain with mild improvement. Patient has mild swelling in both legs is similar to previous and was told it may be secondary to tramadol. No heart failure kidney failure history. Patient has had no fevers however mild urinary frequency. Patient denies recent falls or head injuries. Patient uses a walker to get around. Patient does have assistance at home.  Patient is a 80 y.o. female presenting with back pain. The history is provided by the patient and a relative.  Back Pain Associated symptoms: no abdominal pain, no chest pain, no dysuria, no fever, no headaches, no numbness and no weakness     Past Medical History  Diagnosis Date  . Thyroid disease   . Hypertension   . Hyperlipemia   . Diverticulitis   . Arthritis   . Osteoporosis   . Hypothyroidism   . Hypercholesteremia    Past Surgical History  Procedure Laterality Date  . Back surgery    . Abdominal hysterectomy    . Ankle surgery    . Eye surgery    . Broken wrist Left     left, Dr. Cleophas DunkerWhitfield,  . Hip arthroplasty Left 06/07/2013    Procedure: ARTHROPLASTY BIPOLAR HIP;  Surgeon: Loanne DrillingFrank V Aluisio, MD;  Location: [facility];  Service: Orthopedics;  Laterality: Left;   Family History  Problem Relation Age of Onset  . Heart disease Mother   . Stroke Father    History  Substance Use Topics  . Smoking status: Never Smoker   . Smokeless tobacco: Never Used  . Alcohol Use: No   OB History   Grav  Para Term Preterm Abortions TAB SAB Ect Mult Living                 Review of Systems  Constitutional: Negative for fever and chills.  HENT: Negative for congestion.   Eyes: Negative for visual disturbance.  Respiratory: Negative for shortness of breath.   Cardiovascular: Negative for chest pain.  Gastrointestinal: Negative for vomiting and abdominal pain.  Genitourinary: Positive for frequency. Negative for dysuria, flank pain and difficulty urinating.  Musculoskeletal: Positive for back pain. Negative for neck pain and neck stiffness.  Skin: Negative for rash.  Neurological: Negative for weakness, light-headedness, numbness and headaches.      Allergies  Benadryl; Codeine; Morphine and related; Sulfa antibiotics; Tramadol; and Nitrofurantoin monohyd macro  Home Medications   Prior to Admission medications   Medication Sig Start Date End Date Taking? Authorizing Provider  acetaminophen (TYLENOL) 500 MG tablet Take 1,000 mg by mouth 3 (three) times daily as needed for mild pain or headache. For pain   Yes Historical Provider, MD  albuterol (PROVENTIL HFA;VENTOLIN HFA) 108 (90 BASE) MCG/ACT inhaler Inhale 1 puff into the lungs every 6 (six) hours as needed for wheezing or shortness of breath.   Yes Historical Provider, MD  b complex vitamins capsule Take 1 capsule by mouth daily.   Yes Historical Provider, MD  fish oil-omega-3 fatty acids 1000 MG capsule Take 1 g  by mouth daily.    Yes Historical Provider, MD  furosemide (LASIX) 40 MG tablet Take 40 mg by mouth daily.   Yes Historical Provider, MD  levothyroxine (SYNTHROID, LEVOTHROID) 88 MCG tablet Take 44-88 mcg by mouth daily. Takes everyday except Sunday's take   Yes Historical Provider, MD  lisinopril (PRINIVIL,ZESTRIL) 20 MG tablet Take 20 mg by mouth daily.   Yes Historical Provider, MD  meloxicam (MOBIC) 7.5 MG tablet Take 7.5 mg by mouth daily as needed for pain.   Yes Historical Provider, MD  metoprolol  succinate (TOPROL-XL) 50 MG 24 hr tablet Take 50 mg by mouth daily. Take with or immediately following a meal.   Yes Historical Provider, MD  simvastatin (ZOCOR) 40 MG tablet Take 40 mg by mouth every evening.   Yes Historical Provider, MD  tiotropium (SPIRIVA) 18 MCG inhalation capsule Place 18 mcg into inhaler and inhale daily.   Yes Historical Provider, MD  traMADol (ULTRAM) 50 MG tablet Take 1-2 tablets (50-100 mg total) by mouth every 6 (six) hours as needed (mild to moderate pain). 06/09/13  Yes Alexzandrew Julien Girt, PA-C  cephALEXin (KEFLEX) 500 MG capsule Take 1 capsule (500 mg total) by mouth 2 (two) times daily. 10/30/13   Enid Skeens, MD  HYDROcodone-acetaminophen (NORCO) 5-325 MG per tablet Take 1 tablet by mouth every 4 (four) hours as needed. 10/30/13   Enid Skeens, MD   BP 123/75  Pulse 75  Temp(Src) 98.8 F (37.1 C) (Oral)  Resp 16  SpO2 95% Physical Exam  Nursing note and vitals reviewed. Constitutional: She is oriented to person, place, and time. She appears well-developed and well-nourished.  HENT:  Head: Normocephalic and atraumatic.  Mild dry mucous membranes.  Eyes: Conjunctivae are normal. Right eye exhibits no discharge. Left eye exhibits no discharge.  Neck: Normal range of motion. Neck supple. No tracheal deviation present.  Cardiovascular: Normal rate and regular rhythm.   Pulmonary/Chest: Effort normal and breath sounds normal.  Abdominal: Soft. She exhibits no distension. There is no tenderness. There is no guarding.  Musculoskeletal: She exhibits edema (mild lower extremity bilateral) and tenderness.  Patient has tenderness T11 and T12 midline with no step-off. Mild paraspinal tenderness. No flank tenderness.  Neurological: She is alert and oriented to person, place, and time. No cranial nerve deficit. GCS eye subscore is 4. GCS verbal subscore is 5. GCS motor subscore is 6.  Reflex Scores:      Patellar reflexes are 1+ on the right side and 1+ on the  left side.      Achilles reflexes are 1+ on the right side and 1+ on the left side. Patient has 5+ strength in lower extremities bilateral flexion and extension at major joints. No pain with straight leg raising. No hip pain with range of motion.  Skin: Skin is warm. No rash noted.  Psychiatric: She has a normal mood and affect.    ED Course  Procedures (including critical care time) Labs Review Labs Reviewed  URINALYSIS, ROUTINE W REFLEX MICROSCOPIC - Abnormal; Notable for the following:    APPearance CLOUDY (*)    Nitrite POSITIVE (*)    Leukocytes, UA LARGE (*)    All other components within normal limits  URINE MICROSCOPIC-ADD ON - Abnormal; Notable for the following:    Bacteria, UA MANY (*)    All other components within normal limits  URINE CULTURE    Imaging Review Dg Thoracic Spine W/swimmers  10/30/2013   CLINICAL DATA:  Pain, without   injury.  History of osteoporosis.  EXAM: THORACIC SPINE - 2 VIEW + SWIMMERS  COMPARISON:  CT abdomen pelvis 05/27/2013.  Chest x-ray 06/05/2012.  FINDINGS: Advanced osteopenia. Chronic compression deformity of T8 with severe wedging. Mild compression deformity of T12. Slight endplate irregularity but no definite acute compression deformity.  IMPRESSION: No acute compression deformity is evident.   Electronically Signed   By: Davonna BellingJohn  Curnes M.D.   On: 10/30/2013 15:21     EKG Interpretation None      MDM   Final diagnoses:  Back pain, thoracic  UTI (lower urinary tract infection)   Primary reason for ED visit is worsening back pain.  Patient has had multiple sensitivities to strong pain medicines and has thus avoided them. With tramadol not helping pain I discussed risks and benefits of trying Norco while in ED. Patient agreed to try one Norco pill. X-ray no acute fractures of thoracic spine, reviewed. On recheck patient improved. Reviewed urine result which showed large leukocytes and concerned for urinary infection. Although patient's  back pain is no flank pain and no fever. I discussed observation hospital with patient tried antibiotics. Patient wishes to try to go home and agreed to have dose of Rocephin small fluid bolus and recheck in one hour. Basic labs sent in [place] patient worsened and required hospital admission.  Signed out to recheck and followup blood work in patient status. Patient likely will be going home with pain medications and by mouth antibiotics however if worsening symptoms observation in the hospital will be arranged.  Enid SkeensJoshua M Lorah Kalina, MD 10/30/13 [phone]

## 2013-10-30 NOTE — ED Notes (Signed)
Pts daughter at bedside  

## 2013-10-30 NOTE — ED Notes (Signed)
Per EMS comes from home c/o back pain that started yesterday morning when she woke up.  Pt uses a walker and had been having to use wheelchair more due to her back pain. Pt states if she remains still then denies pain but occurs with movement.  Pt noticed recent redness and swelling to bilat lower legs, her PCp told her that possible side effect to tramadol. Pt had left side hip replacement november 2014.

## 2013-10-30 NOTE — ED Notes (Signed)
Bed: YQ65WA12 Expected date:  Expected time:  Means of arrival:  Comments: EMS- 20s, weakness, donated plasma this morning

## 2013-10-30 NOTE — Progress Notes (Signed)
  CARE MANAGEMENT ED NOTE 10/30/2013  Patient:  Mikayla Villanueva,Mikayla Villanueva   Account Number:  [id number]  Date Initiated:  10/30/2013  Documentation initiated by:  Radford PaxFERRERO,Isador Castille  Subjective/Objective Assessment:   Patient presents to Ed with worsening mid back pain fro the last couple of weeks.     Subjective/Objective Assessment Detail:     Action/Plan:   Action/Plan Detail:   Anticipated DC Date:       Status Recommendation to Physician:   Result of Recommendation:    Other ED Services  Consult Working Plan    DC Planning Services  Other    Choice offered to / List presented to:            Status of service:  Completed, signed off  ED Comments:   ED Comments Detail:  EDCM spoke to patient at bedside.  Patient lives alone but reports her daughter lives right behind and has been living with her since she has been sick.  Patient has a walker, wheelchair, cane , ramp, BSC and shower chair at home. Patient reports she is able to perform her ADL's without difficulty.  [facility]  Patient does not have home health services currently.  Patient's daughter at bedside refused home health agency as she thinks she has one from another [place] mananger.  Patient's [place] also [facility] [place]  Patient and patient's daughter do not feel that patient requires home health at this time.  [facility] concern.  No further EDCM needs at this time.

## 2013-10-30 NOTE — Discharge Instructions (Signed)
If you were given medicines take as directed.  If you are on coumadin or contraceptives realize their levels and effectiveness is altered by many different medicines.  If you have any reaction (rash, tongues swelling, other) to the medicines stop taking and see a physician.   Please follow up as directed and return to the ER or see a physician for new or worsening symptoms (fevers, worsening pain, persistent vomiting).  Thank you. Filed Vitals:   10/30/13 1249  BP: 123/75  Pulse: 75  Temp: 98.8 F (37.1 C)  TempSrc: Oral  Resp: 16  SpO2: 95%

## 2013-10-30 NOTE — ED Provider Notes (Signed)
Patient states she does not really feel like pain medicine helped. Labs reviewed and essentially normal. Daughter and patient state that she has a wheelchair at home and they have been making do without her walking at home. They state that they will be able to transfer her without difficulty at home. She has prescriptions for Keflex and hydrocodone written by Dr. Jodi MourningZavitz. I've discussed return precautions and need for close followup and both patient and daughter voice understanding.  Hilario Quarryanielle S Imani Fiebelkorn, MD 10/30/13 [phone]

## 2013-11-01 LAB — URINE CULTURE: Colony Count: >=100000

## 2013-11-02 NOTE — ED Notes (Signed)
+   urine Patient treated with Cephalexin 500 mg po caps ok per Rolin BarryJeremy Ferns

## 2013-11-04 ENCOUNTER — Telehealth (HOSPITAL_BASED_OUTPATIENT_CLINIC_OR_DEPARTMENT_OTHER): Payer: Self-pay | Admitting: Emergency Medicine

## 2013-11-04 NOTE — Telephone Encounter (Signed)
Post ED Visit - Positive Culture Follow-up  Culture report reviewed by antimicrobial stewardship pharmacist: []  Wes Dulaney, Pharm.D., BCPS [x]  Celedonio MiyamotoJeremy Frens, Pharm.D., BCPS []  Georgina PillionElizabeth Martin, Pharm.D., BCPS []  [place]Minh Pham, [address] BoulevardPharm.D., BCPS, AAHIVP []  Estella HuskMichelle Turner, Pharm.D., BCPS, AAHIVP []  Harvie JuniorNathan Cope, Pharm.D.  Positive urine culture Treated with Keflex, organism sensitive to the same and no further patient follow-up is required at this time.  Zeb ComfortKylie Damyn Weitzel 11/04/2013, 12:15 PM

## 2014-01-14 ENCOUNTER — Inpatient Hospital Stay (HOSPITAL_COMMUNITY)
Admission: EM | Admit: 2014-01-14 | Discharge: 2014-01-16 | DRG: 690 | Disposition: A | Discharge status: Home Health | Payer: Medicare Other | Attending: Internal Medicine | Admitting: Internal Medicine

## 2014-01-14 ENCOUNTER — Emergency Department (HOSPITAL_COMMUNITY): Payer: Medicare Other

## 2014-01-14 ENCOUNTER — Encounter (HOSPITAL_COMMUNITY): Payer: Self-pay | Admitting: Emergency Medicine

## 2014-01-14 DIAGNOSIS — I872 Venous insufficiency (chronic) (peripheral): Secondary | ICD-10-CM | POA: Diagnosis present

## 2014-01-14 DIAGNOSIS — E7800 Pure hypercholesterolemia, unspecified: Secondary | ICD-10-CM | POA: Diagnosis present

## 2014-01-14 DIAGNOSIS — R5383 Other fatigue: Secondary | ICD-10-CM | POA: Diagnosis present

## 2014-01-14 DIAGNOSIS — L02419 Cutaneous abscess of limb, unspecified: Secondary | ICD-10-CM | POA: Diagnosis present

## 2014-01-14 DIAGNOSIS — M810 Age-related osteoporosis without current pathological fracture: Secondary | ICD-10-CM | POA: Diagnosis present

## 2014-01-14 DIAGNOSIS — Z823 Family history of stroke: Secondary | ICD-10-CM | POA: Diagnosis not present

## 2014-01-14 DIAGNOSIS — Z8249 Family history of ischemic heart disease and other diseases of the circulatory system: Secondary | ICD-10-CM | POA: Diagnosis not present

## 2014-01-14 DIAGNOSIS — J4489 Other specified chronic obstructive pulmonary disease: Secondary | ICD-10-CM | POA: Diagnosis present

## 2014-01-14 DIAGNOSIS — W07XXXA Fall from chair, initial encounter: Secondary | ICD-10-CM

## 2014-01-14 DIAGNOSIS — Z79899 Other long term (current) drug therapy: Secondary | ICD-10-CM | POA: Diagnosis not present

## 2014-01-14 DIAGNOSIS — R5381 Other malaise: Secondary | ICD-10-CM | POA: Diagnosis present

## 2014-01-14 DIAGNOSIS — M4850XA Collapsed vertebra, not elsewhere classified, site unspecified, initial encounter for fracture: Secondary | ICD-10-CM | POA: Diagnosis present

## 2014-01-14 DIAGNOSIS — Z885 Allergy status to narcotic agent status: Secondary | ICD-10-CM | POA: Diagnosis not present

## 2014-01-14 DIAGNOSIS — M129 Arthropathy, unspecified: Secondary | ICD-10-CM | POA: Diagnosis present

## 2014-01-14 DIAGNOSIS — M549 Dorsalgia, unspecified: Secondary | ICD-10-CM | POA: Diagnosis present

## 2014-01-14 DIAGNOSIS — J449 Chronic obstructive pulmonary disease, unspecified: Secondary | ICD-10-CM | POA: Diagnosis present

## 2014-01-14 DIAGNOSIS — Z888 Allergy status to other drugs, medicaments and biological substances status: Secondary | ICD-10-CM | POA: Diagnosis not present

## 2014-01-14 DIAGNOSIS — G8929 Other chronic pain: Secondary | ICD-10-CM | POA: Diagnosis present

## 2014-01-14 DIAGNOSIS — E876 Hypokalemia: Secondary | ICD-10-CM

## 2014-01-14 DIAGNOSIS — X58XXXS Exposure to other specified factors, sequela: Secondary | ICD-10-CM | POA: Diagnosis not present

## 2014-01-14 DIAGNOSIS — R600 Localized edema: Secondary | ICD-10-CM

## 2014-01-14 DIAGNOSIS — E785 Hyperlipidemia, unspecified: Secondary | ICD-10-CM | POA: Diagnosis present

## 2014-01-14 DIAGNOSIS — M5137 Other intervertebral disc degeneration, lumbosacral region: Secondary | ICD-10-CM | POA: Diagnosis present

## 2014-01-14 DIAGNOSIS — L03119 Cellulitis of unspecified part of limb: Secondary | ICD-10-CM

## 2014-01-14 DIAGNOSIS — N390 Urinary tract infection, site not specified: Secondary | ICD-10-CM | POA: Diagnosis present

## 2014-01-14 DIAGNOSIS — Z96649 Presence of unspecified artificial hip joint: Secondary | ICD-10-CM

## 2014-01-14 DIAGNOSIS — L97909 Non-pressure chronic ulcer of unspecified part of unspecified lower leg with unspecified severity: Secondary | ICD-10-CM | POA: Diagnosis present

## 2014-01-14 DIAGNOSIS — I10 Essential (primary) hypertension: Secondary | ICD-10-CM | POA: Diagnosis present

## 2014-01-14 DIAGNOSIS — E039 Hypothyroidism, unspecified: Secondary | ICD-10-CM | POA: Diagnosis present

## 2014-01-14 DIAGNOSIS — Z881 Allergy status to other antibiotic agents status: Secondary | ICD-10-CM | POA: Diagnosis not present

## 2014-01-14 DIAGNOSIS — L97919 Non-pressure chronic ulcer of unspecified part of right lower leg with unspecified severity: Secondary | ICD-10-CM

## 2014-01-14 DIAGNOSIS — IMO0002 Reserved for concepts with insufficient information to code with codable children: Secondary | ICD-10-CM | POA: Diagnosis not present

## 2014-01-14 DIAGNOSIS — M51379 Other intervertebral disc degeneration, lumbosacral region without mention of lumbar back pain or lower extremity pain: Secondary | ICD-10-CM | POA: Diagnosis present

## 2014-01-14 DIAGNOSIS — Z66 Do not resuscitate: Secondary | ICD-10-CM | POA: Diagnosis present

## 2014-01-14 DIAGNOSIS — E871 Hypo-osmolality and hyponatremia: Secondary | ICD-10-CM

## 2014-01-14 DIAGNOSIS — L0390 Cellulitis, unspecified: Secondary | ICD-10-CM | POA: Diagnosis present

## 2014-01-14 LAB — CBC WITH DIFFERENTIAL/PLATELET
BASOS ABS: 0 10*3/uL (ref 0.0–0.1)
BASOS PCT: 0 % (ref 0–1)
Eosinophils Absolute: 0 10*3/uL (ref 0.0–0.7)
Eosinophils Relative: 0 % (ref 0–5)
HCT: 40.2 % (ref 36.0–46.0)
Hemoglobin: 13.2 g/dL (ref 12.0–15.0)
Lymphocytes Relative: 18 % (ref 12–46)
Lymphs Abs: 2 10*3/uL (ref 0.7–4.0)
MCH: 31.7 pg (ref 26.0–34.0)
MCHC: 32.8 g/dL (ref 30.0–36.0)
MCV: 96.6 fL (ref 78.0–100.0)
Monocytes Absolute: 0.9 10*3/uL (ref 0.1–1.0)
Monocytes Relative: 8 % (ref 3–12)
NEUTROS PCT: 74 % (ref 43–77)
Neutro Abs: 8.3 10*3/uL — ABNORMAL HIGH (ref 1.7–7.7)
Platelets: 279 10*3/uL (ref 150–400)
RBC: 4.16 MIL/uL (ref 3.87–5.11)
RDW: 13.9 % (ref 11.5–15.5)
WBC: 11.2 10*3/uL — ABNORMAL HIGH (ref 4.0–10.5)

## 2014-01-14 LAB — COMPREHENSIVE METABOLIC PANEL
ALK PHOS: 117 U/L (ref 39–117)
ALT: 17 U/L (ref 0–35)
ANION GAP: 13 (ref 5–15)
AST: 22 U/L (ref 0–37)
Albumin: 3.4 g/dL — ABNORMAL LOW (ref 3.5–5.2)
BILIRUBIN TOTAL: 0.7 mg/dL (ref 0.3–1.2)
BUN: 23 mg/dL (ref 6–23)
CHLORIDE: 94 meq/L — AB (ref 96–112)
CO2: 23 mEq/L (ref 19–32)
Calcium: 8.6 mg/dL (ref 8.4–10.5)
Creatinine, Ser: 0.58 mg/dL (ref 0.50–1.10)
GFR, EST NON AFRICAN AMERICAN: 79 mL/min — AB (ref >90)
GLUCOSE: 163 mg/dL — AB (ref 70–99)
Potassium: 3.4 mEq/L — ABNORMAL LOW (ref 3.7–5.3)
Sodium: 130 mEq/L — ABNORMAL LOW (ref 137–147)
Total Protein: 6.8 g/dL (ref 6.0–8.3)

## 2014-01-14 LAB — URINE MICROSCOPIC-ADD ON

## 2014-01-14 LAB — URINALYSIS, ROUTINE W REFLEX MICROSCOPIC
BILIRUBIN URINE: NEGATIVE
Glucose, UA: NEGATIVE mg/dL
Ketones, ur: NEGATIVE mg/dL
NITRITE: NEGATIVE
PROTEIN: 30 mg/dL — AB
Specific Gravity, Urine: 1.019 (ref 1.005–1.030)
UROBILINOGEN UA: 0.2 mg/dL (ref 0.0–1.0)
pH: 7.5 (ref 5.0–8.0)

## 2014-01-14 LAB — CK: CK TOTAL: 63 U/L (ref 7–177)

## 2014-01-14 MED ORDER — ACETAMINOPHEN 325 MG PO TABS
650.0000 mg | ORAL_TABLET | Freq: Once | ORAL | Status: AC
  Administered 2014-01-14: 650 mg via ORAL
  Filled 2014-01-14: qty 2

## 2014-01-14 MED ORDER — LEVOTHYROXINE SODIUM 88 MCG PO TABS
88.0000 ug | ORAL_TABLET | Freq: Every day | ORAL | Status: DC
  Administered 2014-01-15 – 2014-01-16 (×2): 88 ug via ORAL
  Filled 2014-01-14 (×3): qty 1

## 2014-01-14 MED ORDER — LISINOPRIL 20 MG PO TABS
20.0000 mg | ORAL_TABLET | Freq: Every day | ORAL | Status: DC
  Administered 2014-01-15 – 2014-01-16 (×2): 20 mg via ORAL
  Filled 2014-01-14 (×2): qty 1

## 2014-01-14 MED ORDER — VANCOMYCIN HCL 10 G IV SOLR
1250.0000 mg | INTRAVENOUS | Status: DC
  Filled 2014-01-14: qty 1250

## 2014-01-14 MED ORDER — SODIUM CHLORIDE 0.9 % IV SOLN
INTRAVENOUS | Status: DC
  Administered 2014-01-14 (×2): via INTRAVENOUS

## 2014-01-14 MED ORDER — HEPARIN SODIUM (PORCINE) 5000 UNIT/ML IJ SOLN
5000.0000 [IU] | Freq: Three times a day (TID) | INTRAMUSCULAR | Status: DC
  Administered 2014-01-14 – 2014-01-16 (×6): 5000 [IU] via SUBCUTANEOUS
  Filled 2014-01-14 (×9): qty 1

## 2014-01-14 MED ORDER — SIMVASTATIN 40 MG PO TABS
40.0000 mg | ORAL_TABLET | Freq: Every evening | ORAL | Status: DC
  Administered 2014-01-14 – 2014-01-15 (×2): 40 mg via ORAL
  Filled 2014-01-14 (×3): qty 1

## 2014-01-14 MED ORDER — VANCOMYCIN HCL 10 G IV SOLR
1250.0000 mg | Freq: Once | INTRAVENOUS | Status: AC
  Administered 2014-01-14: 1250 mg via INTRAVENOUS
  Filled 2014-01-14: qty 1250

## 2014-01-14 MED ORDER — FUROSEMIDE 40 MG PO TABS
40.0000 mg | ORAL_TABLET | ORAL | Status: DC
  Filled 2014-01-14: qty 1

## 2014-01-14 MED ORDER — DEXTROSE 5 % IV SOLN
1.0000 g | INTRAVENOUS | Status: DC
  Administered 2014-01-15: 1 g via INTRAVENOUS
  Filled 2014-01-14: qty 10

## 2014-01-14 MED ORDER — MORPHINE SULFATE 2 MG/ML IJ SOLN
2.0000 mg | Freq: Once | INTRAMUSCULAR | Status: DC
  Filled 2014-01-14: qty 1

## 2014-01-14 MED ORDER — DEXTROSE 5 % IV SOLN
1.0000 g | Freq: Once | INTRAVENOUS | Status: AC
  Administered 2014-01-14: 1 g via INTRAVENOUS
  Filled 2014-01-14: qty 10

## 2014-01-14 MED ORDER — ONDANSETRON HCL 4 MG/2ML IJ SOLN
4.0000 mg | Freq: Once | INTRAMUSCULAR | Status: DC
  Filled 2014-01-14: qty 2

## 2014-01-14 MED ORDER — HYDROCODONE-ACETAMINOPHEN 5-325 MG PO TABS
1.0000 | ORAL_TABLET | Freq: Four times a day (QID) | ORAL | Status: DC | PRN
  Administered 2014-01-14 – 2014-01-15 (×2): 1 via ORAL
  Filled 2014-01-14 (×2): qty 1

## 2014-01-14 MED ORDER — TIOTROPIUM BROMIDE MONOHYDRATE 18 MCG IN CAPS
18.0000 ug | ORAL_CAPSULE | Freq: Every day | RESPIRATORY_TRACT | Status: DC
  Administered 2014-01-15 – 2014-01-16 (×2): 18 ug via RESPIRATORY_TRACT
  Filled 2014-01-14: qty 5

## 2014-01-14 MED ORDER — ACETAMINOPHEN 325 MG PO TABS
650.0000 mg | ORAL_TABLET | Freq: Four times a day (QID) | ORAL | Status: DC | PRN
  Administered 2014-01-14 – 2014-01-16 (×4): 650 mg via ORAL
  Filled 2014-01-14 (×4): qty 2

## 2014-01-14 MED ORDER — METOPROLOL SUCCINATE ER 50 MG PO TB24
50.0000 mg | ORAL_TABLET | Freq: Every day | ORAL | Status: DC
  Administered 2014-01-14 – 2014-01-16 (×3): 50 mg via ORAL
  Filled 2014-01-14 (×3): qty 1

## 2014-01-14 NOTE — Plan of Care (Signed)
Problem: Phase I Progression Outcomes Goal: OOB as tolerated unless otherwise ordered Outcome: Progressing Bedrest at this time but has sat up on side of bed.

## 2014-01-14 NOTE — ED Notes (Signed)
Bed: [id number] Expected date:  Expected time:  Means of arrival:  Comments: EMS-back problem

## 2014-01-14 NOTE — ED Notes (Addendum)
Per ems pt is from home, daughter is POA. On Tuesday 6/30 pt bent down to pick something up, felt a "pop", had decreased mobility after that. Today pt has no mobility, unable to stand. Pain 10/10 when moving. Denies pain when resting.   Daughter wrote a note "alergic to narcotics, tramadol, sulfa, microbid. Pt has taken this morning synthroid, lisinopril, 1 tylenol and 1 vicodin. Hx of hip replacement, pin in her ankle, cataract implants, degenerative disc disease, severe arthritis, osteoporosis, leg ulcers, and neuropathy. 2 days ago she "popped" something in her back and could not stand this morning".  Daughter will be coming to hospital shortly.

## 2014-01-14 NOTE — Progress Notes (Signed)
  CARE MANAGEMENT ED NOTE 01/14/2014  Patient:  Mikayla Villanueva,Mikayla Villanueva   Account Number:  [id number]  Date Initiated:  01/14/2014  Documentation initiated by:  Edd ArbourGIBBS,Shannan Garfinkel  Subjective/Objective Assessment:   80 yr old medicare/BCBS covered [place] pt from home, daughter is POA. On Tuesday 6/30 pt bent down to pick something up, felt a "pop", had decreased mobility after that.     Subjective/Objective Assessment Detail:   pcp myers, stephen  [facility] as confirmed by Judeth CornfieldStephanie of [facility] & daughter They are providing wound care to decubitus on feet  [facility] will follow pt during admisison for further needs [facility], attending MD     Action/Plan:   ED Cm consulted byr ED RN after request of dtr for additional home health services for mobility issues.  ED CM spoke with  pt & daughter&  Dr Elvera LennoxGherghe about possible need for d/c orders to include HHPT HH aide additions   Action/Plan Detail:   Page to discuss admission with attending MD who stated dx UTI/compression fx.  Spoke with Judeth CornfieldStephanie of [facility] via phone Will follow pt in hospital   Anticipated DC Date:  01/17/2014     Status Recommendation to Physician:   Result of Recommendation:    Other ED Services  Consult Working Plan  Appropriate Status Consult    DC Planning Services  Other  PCP issues   [facility]  HOME HEALTH   Choice offered to / List presented to:       [facility]      [facility]  [facility]    Status of service:  Completed, signed off  ED Comments:   ED Comments Detail:

## 2014-01-14 NOTE — H&P (Signed)
History and Physical    Mikayla Villanueva WNU:[id number] DOB: 12/13/1920 DOA: 01/14/2014  Referring physician: Dr. Denton LankSteinl PCP: Joycelyn RuaMEYERS, STEPHEN, MD  Specialists: none   Chief Complaint: back pain  HPI: Mikayla Villanueva is a 80 y.o. female has a past medical history significant for chronic back pain due to compression fractures, LE venous stasis and cellulitis s/p recently completed antibiotics, HTN, HLD, hypothyroidism, presents to the ED with weakness and back pain. About couple of days prior she felt a pop in her back when she bent over and has been having back pain since. She sleeps in the recliner usually due to chronic back pain and last night she slid off and was unable to get up for few hours. She denies any fever or chills, has had intermittent dysuria (none recently), denies any chest pain or breathing difficulties. Denies abdominal pain, nausea or vomiting.   Review of Systems: as per HPI otherwise negative   Past Medical History  Diagnosis Date  . Thyroid disease   . Hypertension   . Hyperlipemia   . Diverticulitis   . Arthritis   . Osteoporosis   . Hypothyroidism   . Hypercholesteremia    Past Surgical History  Procedure Laterality Date  . Back surgery    . Abdominal hysterectomy    . Ankle surgery    . Eye surgery    . Broken wrist Left     left, Dr. Cleophas DunkerWhitfield,  . Hip arthroplasty Left 06/07/2013    Procedure: ARTHROPLASTY BIPOLAR HIP;  Surgeon: Loanne DrillingFrank V Aluisio, MD;  Location: [facility];  Service: Orthopedics;  Laterality: Left;   Social History:  reports that she has never smoked. She has never used smokeless tobacco. She reports that she does not drink alcohol or use illicit drugs.  Allergies  Allergen Reactions  . Benadryl [Diphenhydramine]     hallicuinates  . Codeine     hallucinates  . Morphine And Related     hallucinates  . Sulfa Antibiotics Hives  . Tramadol Other (See Comments)    Makes legs swell   . Nitrofurantoin Monohyd Macro Hives    Family History    Problem Relation Age of Onset  . Heart disease Mother   . Stroke Father     Prior to Admission medications   Medication Sig Start Date End Date Taking? Authorizing Provider  acetaminophen (TYLENOL) 500 MG tablet Take 1,000 mg by mouth 3 (three) times daily as needed for mild pain or headache. For pain   Yes Historical Provider, MD  cadexomer iodine (IODOSORB) 0.9 % gel Apply 1 application topically 3 (three) times a week.   Yes Historical Provider, MD  furosemide (LASIX) 40 MG tablet Take 40 mg by mouth every other day.    Yes Historical Provider, MD  HYDROcodone-acetaminophen (NORCO/VICODIN) 5-325 MG per tablet Take 1 tablet by mouth every 4 (four) hours as needed for moderate pain.   Yes Historical Provider, MD  levothyroxine (SYNTHROID, LEVOTHROID) 88 MCG tablet Take 88 mcg by mouth daily.    Yes Historical Provider, MD  lisinopril (PRINIVIL,ZESTRIL) 20 MG tablet Take 20 mg by mouth daily.   Yes Historical Provider, MD  metoprolol succinate (TOPROL-XL) 50 MG 24 hr tablet Take 50 mg by mouth daily. Take with or immediately following a meal.   Yes Historical Provider, MD  mupirocin ointment (BACTROBAN) 2 % Place 1 application into the nose 2 (two) times daily.   Yes Historical Provider, MD  simvastatin (ZOCOR) 40 MG tablet Take 40 mg by  mouth every evening.   Yes Historical Provider, MD  tiotropium (SPIRIVA) 18 MCG inhalation capsule Place 18 mcg into inhaler and inhale daily.   Yes Historical Provider, MD   Physical Exam: Filed Vitals:   01/14/14 1100 01/14/14 1136 01/14/14 1324 01/14/14 1343  BP: 131/68  156/80 169/76  Pulse: 88  91 98  Temp:    97.9 F (36.6 C)  TempSrc:      Resp: 16   16  SpO2: 95% 96% 98% 96%     General:  No apparent distress  Eyes: no scleral icterus  ENT: moist oropharynx  Neck: supple, no JVD  Cardiovascular: regular rate 3/6 SEM; 2+ peripheral pulses  Respiratory: CTA biL, good air movement without wheezing, rhonchi or crackled  Abdomen: soft,  non tender to palpation, positive bowel sounds, no guarding, no rebound  Skin: no rashes  Musculoskeletal: 1-2 + pitting edema in her legs, chronic venous stasis changes and cellulitic changes R>L  Psychiatric: normal mood and affect  Neurologic: non focal  Labs on Admission:  Basic Metabolic Panel:  Recent Labs Lab 01/14/14 0955  NA 130*  K 3.4*  CL 94*  CO2 23  GLUCOSE 163*  BUN 23  CREATININE 0.58  CALCIUM 8.6   Liver Function Tests:  Recent Labs Lab 01/14/14 0955  AST 22  ALT 17  ALKPHOS 117  BILITOT 0.7  PROT 6.8  ALBUMIN 3.4*   CBC:  Recent Labs Lab 01/14/14 0955  WBC 11.2*  NEUTROABS 8.3*  HGB 13.2  HCT 40.2  MCV 96.6  PLT 279   Cardiac Enzymes:  Recent Labs Lab 01/14/14 0955  CKTOTAL 63   Radiological Exams on Admission: Dg Thoracic Spine 2 View  01/14/2014   CLINICAL DATA:  Back pain  EXAM: THORACIC SPINE - 2 VIEW  COMPARISON:  October 30, 2013  FINDINGS: Severe chronic fracture of T8 is unchanged.  New fracture T10 with a moderate compression fracture. This may be an acute fracture.  Mild compression fracture T12 is unchanged and appears chronic.  Generalized osteopenia.  IMPRESSION: New fracture T10 compared with 10/30/2013.   Electronically Signed   By: Marlan Palauharles  Clark M.D.   On: 01/14/2014 11:41   Dg Lumbar Spine Complete  01/14/2014   CLINICAL DATA:  Pain  EXAM: LUMBAR SPINE - COMPLETE 4+ VIEW  COMPARISON:  CT abdomen pelvis 05/27/2013.  FINDINGS: The bones are osteopenic. Interval development of a compression deformity involving L1 appears to be in the main tube of 25-30% vertebral body height loss. Age indeterminate. Chronic compression deformity involving T12. Multilevel degenerative disc disease changes most prominent at L4-5. Stable Schmorl's node at L3. A left hip prostheses partially visualized grossly unremarkable.  IMPRESSION: Which compression deformity involving L1 age indeterminate. Correlation when point tenderness and history  recommended.  Chronic multilevel degenerative disc disease changes and osteopenia.  Chronic compression deformity involving T12.   Electronically Signed   By: Salome HolmesHector  Cooper M.D.   On: 01/14/2014 11:48    Assessment/Plan Active Problems:   Hypertension   COPD (chronic obstructive pulmonary disease)   UTI (lower urinary tract infection)   Unspecified hypothyroidism   Compression fracture   Cellulitis   UTI - urinalysis with evidence of infection, start Ceftriaxone, send urine cultures.  LE Cellulitis - per family looks better, she just completed a course of Doxycyline. She has erythema, mild leukocytosis, R>L changes c/w cellulitis superimposed on chronic venous stasis, will start Vancomycin. - 2 deeper ulcers present, wound consult Weakness - due to #  1 and #2, PT consult T10 compression fracture - symptomatic management, PT evaluation. Tylenol for pain, she does not want narcotics.  Hypothyroidism - continue synthroid HTN - continue home medications.   Diet: regular  Fluids: none DVT Prophylaxis: heparin  Code Status: DNR  Family Communication: daughter at bedside  Disposition Plan: inpatient  Time spent: 37  This note has been created with Education officer, environmental. Any transcriptional errors are unintentional.   Graysyn Bache M. Elvera Lennox, MD [facility] Pager [phone]  If 7PM-7AM, please contact night-coverage [email or web address] Password [email or web address] 01/14/2014, 1:56 PM

## 2014-01-14 NOTE — Progress Notes (Signed)
UR completed 

## 2014-01-14 NOTE — Progress Notes (Signed)
Chart and history reviewed next review on [id number]

## 2014-01-14 NOTE — ED Notes (Signed)
[facility] manager contacted regarding home health care for patient. Pt is already seen by [facility] for her foot ulcers. Per daughter "I don't know what I'm supposed to do until she can walk again and move herself." Daughter aware [facility] management is involved and will speak further with her.

## 2014-01-14 NOTE — Progress Notes (Signed)
ANTIBIOTIC CONSULT NOTE - INITIAL  Pharmacy Consult for Vancomycin Indication: cellulitis  Allergies  Allergen Reactions  . Benadryl [Diphenhydramine]     hallicuinates  . Codeine     hallucinates  . Morphine And Related     hallucinates  . Sulfa Antibiotics Hives  . Tramadol Other (See Comments)    Makes legs swell   . Nitrofurantoin Monohyd Macro Hives    Patient Measurements:     Vital Signs: Temp: 97.9 F (36.6 C) (07/02 1343) Temp src: Oral (07/02 0924) BP: 169/76 mmHg (07/02 1343) Pulse Rate: 98 (07/02 1343) Intake/Output from previous day:   Intake/Output from this shift: Total I/O In: -  Out: 300 [Urine:300]  Labs:  Recent Labs  01/14/14 0955  WBC 11.2*  HGB 13.2  PLT 279  CREATININE 0.58   The CrCl is unknown because both a height and weight (above a minimum accepted value) are required for this calculation. No results found for this basename: VANCOTROUGH, VANCOPEAK, VANCORANDOM, GENTTROUGH, GENTPEAK, GENTRANDOM, TOBRATROUGH, TOBRAPEAK, TOBRARND, AMIKACINPEAK, AMIKACINTROU, AMIKACIN,  in the last 72 hours   Microbiology: No results found for this or any previous visit (from the past 720 hour(s)).  Medical History: Past Medical History  Diagnosis Date  . Thyroid disease   . Hypertension   . Hyperlipemia   . Diverticulitis   . Arthritis   . Osteoporosis   . Hypothyroidism   . Hypercholesteremia      Assessment: [age] yoF presenting with back pain and weakness admitted noted to have UTI and increased redness to right lower leg.  Has hx venous stasis and superficial ulcerations to bilateral lower legs - home health has been applying dressings. Denies fever or chills.  Family reports patient recently took doxycycline for possible leg cellulitis.  Pharmacy consulted to dose vancomycin for cellulitis.  IV Rocephin continued by MD for abnormal UA.  Weight: 68 kg as of last year, RN reports she is currently busy and will update weight soon SCr 0.58,  CrCl ~49 ml/min (CG, using age adjusted SCr 0.8) WBC mildly elevated Afebrile Urine culture sent  Goal of Therapy:  Vancomycin trough level 10-15 mcg/ml  Plan:  1.  Vancomycin 1250 mg IV q24h. 2.  F/u weight and adjust dose if significant weight change from last year. 3.  F/u SCr, trough levels as needed, clinical course.  Clance Bollunyon, Espen Bethel 01/14/2014,1:59 PM

## 2014-01-14 NOTE — ED Notes (Signed)
Pt alert and oriented x4. Respirations even and unlabored, bilateral symmetrical rise and fall of chest. Skin warm and dry. In no acute distress. Denies needs.   

## 2014-01-14 NOTE — ED Notes (Signed)
Pt to xray

## 2014-01-14 NOTE — ED Provider Notes (Signed)
CSN: [id number]     Arrival date & time 01/14/14  1478 History   First MD Initiated Contact with Patient 01/14/14 0930     Chief Complaint  Patient presents with  . Back Pain  . unable to stand now      (Consider location/radiation/quality/duration/timing/severity/associated sxs/prior Treatment) Patient is a 80 y.o. female presenting with back pain. The history is provided by the patient.  Back Pain Associated symptoms: no abdominal pain, no chest pain, no dysuria, no fever and no headaches   pt c/o generalized weakness, and worsening low back pain the past 2 days. Hx thoracic compression fractures/back pain. Pain worse in the past couple days after bending and feeling pop in back.  Normally sleeps in recliner due to back pain, last night slide out of recliner onto floor and could not get self up for several hours until this morning. Pt denies radicular pain. No focal or unilateral numbness/weakness. No urine retention.  Pt also notes increased redness to right lower leg.  Has hx venous stasis and superficial ulcerations to bilateral lower legs - home health has been applying dressings. Denies fever or chills. Pt states otherwise has felt in her normal baseline state of health.  Pt states mobility is that normally is ambulatory, uses walker, also has wheelchair at home.  Lives independently, family lives nearby.     Past Medical History  Diagnosis Date  . Thyroid disease   . Hypertension   . Hyperlipemia   . Diverticulitis   . Arthritis   . Osteoporosis   . Hypothyroidism   . Hypercholesteremia    Past Surgical History  Procedure Laterality Date  . Back surgery    . Abdominal hysterectomy    . Ankle surgery    . Eye surgery    . Broken wrist Left     left, Dr. Cleophas Dunker,  . Hip arthroplasty Left 06/07/2013    Procedure: ARTHROPLASTY BIPOLAR HIP;  Surgeon: Loanne Drilling, MD;  Location: [facility];  Service: Orthopedics;  Laterality: Left;   Family History  Problem Relation Age of  Onset  . Heart disease Mother   . Stroke Father    History  Substance Use Topics  . Smoking status: Never Smoker   . Smokeless tobacco: Never Used  . Alcohol Use: No   OB History   Grav Para Term Preterm Abortions TAB SAB Ect Mult Living                 Review of Systems  Constitutional: Negative for fever and chills.  HENT: Negative for sore throat.   Eyes: Negative for redness.  Respiratory: Negative for shortness of breath.   Cardiovascular: Negative for chest pain.  Gastrointestinal: Negative for vomiting, abdominal pain and diarrhea.  Genitourinary: Negative for dysuria and flank pain.  Musculoskeletal: Positive for back pain. Negative for neck pain.  Skin: Negative for rash.  Neurological: Negative for headaches.  Hematological: Does not bruise/bleed easily.  Psychiatric/Behavioral: Negative for confusion.      Allergies  Benadryl; Codeine; Morphine and related; Sulfa antibiotics; Tramadol; and Nitrofurantoin monohyd macro  Home Medications   Prior to Admission medications   Medication Sig Start Date End Date Taking? Authorizing Provider  acetaminophen (TYLENOL) 500 MG tablet Take 1,000 mg by mouth 3 (three) times daily as needed for mild pain or headache. For pain    Historical Provider, MD  albuterol (PROVENTIL HFA;VENTOLIN HFA) 108 (90 BASE) MCG/ACT inhaler Inhale 1 puff into the lungs every 6 (six) hours as  needed for wheezing or shortness of breath.    Historical Provider, MD  b complex vitamins capsule Take 1 capsule by mouth daily.    Historical Provider, MD  cephALEXin (KEFLEX) 500 MG capsule Take 1 capsule (500 mg total) by mouth 2 (two) times daily. 10/30/13   Enid SkeensJoshua M Zavitz, MD  fish oil-omega-3 fatty acids 1000 MG capsule Take 1 g by mouth daily.     Historical Provider, MD  furosemide (LASIX) 40 MG tablet Take 40 mg by mouth daily.    Historical Provider, MD  HYDROcodone-acetaminophen (NORCO) 5-325 MG per tablet Take 1 tablet by mouth every 4 (four)  hours as needed. 10/30/13   Enid SkeensJoshua M Zavitz, MD  levothyroxine (SYNTHROID, LEVOTHROID) 88 MCG tablet Take 44-88 mcg by mouth daily. Takes 88mcg everyday except Sunday's take 44mcg    Historical Provider, MD  lisinopril (PRINIVIL,ZESTRIL) 20 MG tablet Take 20 mg by mouth daily.    Historical Provider, MD  meloxicam (MOBIC) 7.5 MG tablet Take 7.5 mg by mouth daily as needed for pain.    Historical Provider, MD  metoprolol succinate (TOPROL-XL) 50 MG 24 hr tablet Take 50 mg by mouth daily. Take with or immediately following a meal.    Historical Provider, MD  simvastatin (ZOCOR) 40 MG tablet Take 40 mg by mouth every evening.    Historical Provider, MD  tiotropium (SPIRIVA) 18 MCG inhalation capsule Place 18 mcg into inhaler and inhale daily.    Historical Provider, MD  traMADol (ULTRAM) 50 MG tablet Take 1-2 tablets (50-100 mg total) by mouth every 6 (six) hours as needed (mild to moderate pain). 06/09/13   Avel Peacerew Perkins, PA-C   BP 159/82  Pulse 96  Temp(Src) 98 F (36.7 C) (Oral)  Resp 16  SpO2 95% Physical Exam  Nursing note and vitals reviewed. Constitutional: She appears well-developed and well-nourished. No distress.  HENT:  Head: Atraumatic.  Mouth/Throat: Oropharynx is clear and moist.  Eyes: Conjunctivae are normal. No scleral icterus.  Neck: Neck supple. No tracheal deviation present.  Cardiovascular: Normal rate, regular rhythm, normal heart sounds and intact distal pulses.   Pulmonary/Chest: Effort normal and breath sounds normal. No respiratory distress. She exhibits no tenderness.  Abdominal: Soft. Normal appearance and bowel sounds are normal. She exhibits no distension. There is no tenderness.  Genitourinary:  No cva tenderness  Musculoskeletal: She exhibits no edema.  Mid to lower thoracic and upper lumbar tenderness, otherwise CTLS spine, non tender, aligned, no step off.  Good rom bil ext without pain or focal bony tenderness. Pt with chronic edema bil lower legs. Mild  erythema to left lower leg w superficial skin ulceration.  Marked erythema and increased warmth to right lower leg,+ superficial ulceration.  Distal pulses palp.    Neurological: She is alert.  Skin: Skin is warm and dry. No rash noted.  Psychiatric: She has a normal mood and affect.    ED Course  Procedures (including critical care time) Labs Review  Results for orders placed during the hospital encounter of 01/14/14  COMPREHENSIVE METABOLIC PANEL      Result Value Ref Range   Sodium 130 (*) 137 - 147 mEq/L   Potassium 3.4 (*) 3.7 - 5.3 mEq/L   Chloride 94 (*) 96 - 112 mEq/L   CO2 23  19 - 32 mEq/L   Glucose, Bld 163 (*) 70 - 99 mg/dL   BUN 23  6 - 23 mg/dL   Creatinine, Ser 0.980.58  0.50 - 1.10 mg/dL  Calcium 8.6  8.4 - 10.5 mg/dL   Total Protein 6.8  6.0 - 8.3 g/dL   Albumin 3.4 (*) 3.5 - 5.2 g/dL   AST 22  0 - 37 U/L   ALT 17  0 - 35 U/L   Alkaline Phosphatase 117  39 - 117 U/L   Total Bilirubin 0.7  0.3 - 1.2 mg/dL   GFR calc non Af Amer 79 (*) >90 mL/min   GFR calc Af Amer >90  >90 mL/min   Anion gap 13  5 - 15  CBC WITH DIFFERENTIAL      Result Value Ref Range   WBC 11.2 (*) 4.0 - 10.5 K/uL   RBC 4.16  3.87 - 5.11 MIL/uL   Hemoglobin 13.2  12.0 - 15.0 g/dL   HCT 16.1  09.6 - 04.5 %   MCV 96.6  78.0 - 100.0 fL   MCH 31.7  26.0 - 34.0 pg   MCHC 32.8  30.0 - 36.0 g/dL   RDW 40.9  81.1 - 91.4 %   Platelets 279  150 - 400 K/uL   Neutrophils Relative % 74  43 - 77 %   Neutro Abs 8.3 (*) 1.7 - 7.7 K/uL   Lymphocytes Relative 18  12 - 46 %   Lymphs Abs 2.0  0.7 - 4.0 K/uL   Monocytes Relative 8  3 - 12 %   Monocytes Absolute 0.9  0.1 - 1.0 K/uL   Eosinophils Relative 0  0 - 5 %   Eosinophils Absolute 0.0  0.0 - 0.7 K/uL   Basophils Relative 0  0 - 1 %   Basophils Absolute 0.0  0.0 - 0.1 K/uL  URINALYSIS, ROUTINE W REFLEX MICROSCOPIC      Result Value Ref Range   Color, Urine YELLOW  YELLOW   APPearance CLOUDY (*) CLEAR   Specific Gravity, Urine 1.019  1.005 -  1.030   pH 7.5  5.0 - 8.0   Glucose, UA NEGATIVE  NEGATIVE mg/dL   Hgb urine dipstick SMALL (*) NEGATIVE   Bilirubin Urine NEGATIVE  NEGATIVE   Ketones, ur NEGATIVE  NEGATIVE mg/dL   Protein, ur 30 (*) NEGATIVE mg/dL   Urobilinogen, UA 0.2  0.0 - 1.0 mg/dL   Nitrite NEGATIVE  NEGATIVE   Leukocytes, UA MODERATE (*) NEGATIVE  CK      Result Value Ref Range   Total CK 63  7 - 177 U/L  URINE MICROSCOPIC-ADD ON      Result Value Ref Range   Squamous Epithelial / LPF FEW (*) RARE   WBC, UA 21-50  <3 WBC/hpf   RBC / HPF 3-6  <3 RBC/hpf   Bacteria, UA FEW (*) RARE   Dg Thoracic Spine 2 View  01/14/2014   CLINICAL DATA:  Back pain  EXAM: THORACIC SPINE - 2 VIEW  COMPARISON:  October 30, 2013  FINDINGS: Severe chronic fracture of T8 is unchanged.  New fracture T10 with a moderate compression fracture. This may be an acute fracture.  Mild compression fracture T12 is unchanged and appears chronic.  Generalized osteopenia.  IMPRESSION: New fracture T10 compared with 10/30/2013.   Electronically Signed   By: Marlan Palau M.D.   On: 01/14/2014 11:41   Dg Lumbar Spine Complete  01/14/2014   CLINICAL DATA:  Pain  EXAM: LUMBAR SPINE - COMPLETE 4+ VIEW  COMPARISON:  CT abdomen pelvis 05/27/2013.  FINDINGS: The bones are osteopenic. Interval development of a compression deformity involving L1 appears to  be in the main tube of 25-30% vertebral body height loss. Age indeterminate. Chronic compression deformity involving T12. Multilevel degenerative disc disease changes most prominent at L4-5. Stable Schmorl's node at L3. A left hip prostheses partially visualized grossly unremarkable.  IMPRESSION: Which compression deformity involving L1 age indeterminate. Correlation when point tenderness and history recommended.  Chronic multilevel degenerative disc disease changes and osteopenia.  Chronic compression deformity involving T12.   Electronically Signed   By: Salome HolmesHector  Cooper M.D.   On: 01/14/2014 11:48     MDM   Iv. Labs. Xr.  Reviewed nursing notes and prior charts for additional history.   ua positive, urine culture.   Rocephin iv.   Family reports pt took recent abx, doxy,  for possible leg cellulitis.   Pt w new (and old) compressions fxs on xr.   Med service contact for admission re uti, weakness, falls, compression fx, possible cellulitis.      Suzi RootsKevin E Breton Berns, MD 01/14/14 1155

## 2014-01-15 DIAGNOSIS — L97909 Non-pressure chronic ulcer of unspecified part of unspecified lower leg with unspecified severity: Secondary | ICD-10-CM

## 2014-01-15 DIAGNOSIS — T148XXA Other injury of unspecified body region, initial encounter: Secondary | ICD-10-CM

## 2014-01-15 DIAGNOSIS — N390 Urinary tract infection, site not specified: Principal | ICD-10-CM

## 2014-01-15 DIAGNOSIS — R609 Edema, unspecified: Secondary | ICD-10-CM

## 2014-01-15 LAB — CBC
HEMATOCRIT: 40 % (ref 36.0–46.0)
Hemoglobin: 13.4 g/dL (ref 12.0–15.0)
MCH: 32.8 pg (ref 26.0–34.0)
MCHC: 33.5 g/dL (ref 30.0–36.0)
MCV: 97.8 fL (ref 78.0–100.0)
PLATELETS: 244 10*3/uL (ref 150–400)
RBC: 4.09 MIL/uL (ref 3.87–5.11)
RDW: 14 % (ref 11.5–15.5)
WBC: 9.8 10*3/uL (ref 4.0–10.5)

## 2014-01-15 LAB — URINE CULTURE: Colony Count: 40000

## 2014-01-15 LAB — BASIC METABOLIC PANEL
ANION GAP: 11 (ref 5–15)
BUN: 20 mg/dL (ref 6–23)
CHLORIDE: 99 meq/L (ref 96–112)
CO2: 28 meq/L (ref 19–32)
Calcium: 8.6 mg/dL (ref 8.4–10.5)
Creatinine, Ser: 0.6 mg/dL (ref 0.50–1.10)
GFR calc Af Amer: >90 mL/min (ref >90)
GFR calc non Af Amer: 78 mL/min — ABNORMAL LOW (ref >90)
Glucose, Bld: 115 mg/dL — ABNORMAL HIGH (ref 70–99)
Potassium: 4 mEq/L (ref 3.7–5.3)
Sodium: 138 mEq/L (ref 137–147)

## 2014-01-15 MED ORDER — CEFUROXIME AXETIL 250 MG PO TABS
250.0000 mg | ORAL_TABLET | Freq: Two times a day (BID) | ORAL | Status: DC
  Administered 2014-01-15 – 2014-01-16 (×2): 250 mg via ORAL
  Filled 2014-01-15 (×4): qty 1

## 2014-01-15 MED ORDER — MUPIROCIN 2 % EX OINT
TOPICAL_OINTMENT | Freq: Two times a day (BID) | CUTANEOUS | Status: DC
  Administered 2014-01-15: 1 via TOPICAL
  Administered 2014-01-15 – 2014-01-16 (×2): via TOPICAL
  Filled 2014-01-15 (×3): qty 22

## 2014-01-15 MED ORDER — LIDOCAINE 5 % EX PTCH
1.0000 | MEDICATED_PATCH | CUTANEOUS | Status: DC
  Administered 2014-01-15: 1 via TRANSDERMAL
  Filled 2014-01-15 (×2): qty 1

## 2014-01-15 NOTE — Progress Notes (Signed)
Physical Therapy Treatment Patient Details Name: Mikayla Villanueva MRN: [id number] DOB: 10/01/1920 Today's Date: 01/15/2014    History of Present Illness 80 yo female adm after near fall out of recliner and comp fx at T10 (new); PMHx: chronic T8 comp fx, HTN, osteoporsis, LE venous stasis ulcers-cellulitis, peripheral neuropathy    PT Comments    Assisted out of recliner to amb to BR then in hallway.  Very slow gait and requires increased time.  Assisted back to bed required + 2 assist with increased time to position to comfort.   Follow Up Recommendations  Home health PT;Supervision for mobility/OOB     Equipment Recommendations  None recommended by PT (has everything)    Recommendations for Other Services       Precautions / Restrictions Precautions Precautions: Back    Mobility  Bed Mobility Overal bed mobility: Needs Assistance Bed Mobility: Sit to Supine Rolling: Min assist;Mod assist Sidelying to sit: Mod assist   Sit to supine: +2 for physical assistance;Max assist   General bed mobility comments: assisted back to bed + 2 assist and increased time to minimize back pain  Transfers Overall transfer level: Needs assistance Equipment used: Rolling walker (2 wheeled) Transfers: Sit to/from Stand Sit to Stand: +2 safety/equipment;Mod assist;From elevated surface;Min assist         General transfer comment: cues for hand placement, knee flexion/foot placement  and wt shift to limit spine stress   Increased assist off lower surface toilet  Ambulation/Gait Ambulation/Gait assistance: +2 physical assistance;Min assist;Mod assist Ambulation Distance (Feet): 22 Feet Assistive device: Rolling walker (2 wheeled) Gait Pattern/deviations: Decreased stride length;Trunk flexed Gait velocity: decr   General Gait Details: cues for RW position, use of UEs, posture.  Increased cueing and assist for turns.   Stairs            Wheelchair Mobility    Modified Rankin (Stroke  Patients Only)       Balance Overall balance assessment: Needs assistance;History of Falls         Standing balance support: Bilateral upper extremity supported Standing balance-Leahy Scale: Poor                      Cognition Arousal/Alertness: Awake/alert Behavior During Therapy: WFL for tasks assessed/performed Overall Cognitive Status: Within Functional Limits for tasks assessed                      Exercises      General Comments        Pertinent Vitals/Pain C/o 6/10 back pain     Home Living Family/patient expects to be discharged to:: Unsure Living Arrangements: Alone Available Help at Discharge: Available 24 hours/day (dtr staying 16-24hrs per day PTA) Type of Home: House Home Access: Stairs to enter Entrance Stairs-Rails: Right Home Layout: One level Home Equipment: Grab bars - tub/shower;Cane - single point;Bedside commode;Shower seat;Walker - 2 wheels;Wheelchair - [employer]manual;Walker - 4 wheels Additional Comments: tub, handicapped height commode    Prior Function Level of Independence: Independent with assistive device(s) (household amb)      Comments: cooks small meals; dtr assists at times with household tasks; pt likes to do things for herself per dtr   PT Goals (current goals can now be found in the care plan section) Acute Rehab PT Goals Patient Stated Goal: home and pt to be able to amb short household distance if possible per dtr PT Goal Formulation: With patient Time For Goal Achievement: 01/20/14 Potential to  Achieve Goals: Good Progress towards PT goals: Progressing toward goals    Frequency  Min 4X/week    PT Plan      Co-evaluation             End of Session Equipment Utilized During Treatment: Gait belt Activity Tolerance: Patient limited by fatigue;Patient limited by pain Patient left: in bed;with call bell/phone within reach;with family/visitor present     Time: 1326-1350 PT Time Calculation (min): 24  min  Charges:  $Gait Training: 8-22 mins $Therapeutic Activity: 8-22 mins                    G Codes:      Felecia ShellingLori Rafay Dahan  PTA [facility]  Acute  Rehab Pager      [phone]

## 2014-01-15 NOTE — Evaluation (Addendum)
Physical Therapy Evaluation Patient Details Name: Mikayla Villanueva MRN: [id number] DOB: 11/24/1920 Today's Date: 01/15/2014   History of Present Illness  80 yo female adm after near fall out of recliner and comp fx at T10 (new); PMHx: chronic T8 comp fx, HTN, osteoporsis, Le venous stasis ulcers-cellulitis, peripheral neuropathy  Clinical Impression  Pt will benefit form PT to address deficits below; Will need to mobilize as much as possible  To be able to D/C home with HHPT and daughter's supervision;  She is weak and deconditioned from her baseline; If pt with slow progress and pain control issues, may need SNF     Follow Up Recommendations Home health PT;Supervision for mobility/OOB    Equipment Recommendations  None recommended by PT    Recommendations for Other Services       Precautions / Restrictions Precautions Precautions: Back      Mobility  Bed Mobility Overal bed mobility: Needs Assistance Bed Mobility: Rolling;Sidelying to Sit Rolling: Min assist;Mod assist Sidelying to sit: Mod assist       General bed mobility comments: cues for back safety, position, assist with trunk and LEs off bed  Transfers Overall transfer level: Needs assistance Equipment used: Rolling walker (2 wheeled) Transfers: Sit to/from Stand Sit to Stand: +2 safety/equipment;Mod assist;From elevated surface;Min assist         General transfer comment: cues for hand placement, knee flexion/foot placement  and wt shift to limit spine stress   Ambulation/Gait Ambulation/Gait assistance: Min assist;+2 safety/equipment;Mod assist (chair follow) Ambulation Distance (Feet): 15 Feet Assistive device: Rolling walker (2 wheeled) Gait Pattern/deviations: Decreased step length - right;Step-to pattern;Decreased step length - left;Drifts right/left;Trunk flexed Gait velocity: decr   General Gait Details: cues for RW position, use of UEs, posture  Stairs            Wheelchair Mobility     Modified Rankin (Stroke Patients Only)       Balance Overall balance assessment: Needs assistance;History of Falls         Standing balance support: Bilateral upper extremity supported Standing balance-Leahy Scale: Poor                               Pertinent Vitals/Pain C/o back pain, not rated; pt was premedicated    Home Living Family/patient expects to be discharged to:: Unsure Living Arrangements: Alone Available Help at Discharge: Available 24 hours/day (dtr staying 16-24hrs per day PTA) Type of Home: House Home Access: Stairs to enter Entrance Stairs-Rails: Right Entrance Stairs-Number of Steps: 3 in front , 4 in back; ; 1 handrail in front, one in back Home Layout: One level Home Equipment: Grab bars - tub/shower;Cane - single point;Bedside commode;Shower seat;Walker - 2 wheels;Wheelchair - [employer]manual;Walker - 4 wheels Additional Comments: tub, handicapped height commode    Prior Function Level of Independence: Independent with assistive device(s) (household amb)         Comments: cooks small meals; dtr assists at times with household tasks; pt likes to do things for herself per dtr     Hand Dominance        Extremity/Trunk Assessment   Upper Extremity Assessment: Generalized weakness           Lower Extremity Assessment: Generalized weakness         Communication   Communication: No difficulties  Cognition Arousal/Alertness: Awake/alert Behavior During Therapy: WFL for tasks assessed/performed Overall Cognitive Status: Within Functional Limits for tasks assessed  General Comments      Exercises        Assessment/Plan    PT Assessment Patient needs continued PT services  PT Diagnosis Difficulty walking;Generalized weakness   PT Problem List Decreased strength;Decreased activity tolerance;Decreased balance;Decreased mobility;Decreased knowledge of use of DME  PT Treatment Interventions DME  instruction;Gait training;Stair training;Functional mobility training;Therapeutic activities;Therapeutic exercise;Patient/family education   PT Goals (Current goals can be found in the Care Plan section) Acute Rehab PT Goals Patient Stated Goal: home and pt to be able to amb short household distance if possible per dtr PT Goal Formulation: With patient Time For Goal Achievement: 01/20/14 Potential to Achieve Goals: Good    Frequency Min 4X/week   Barriers to discharge        Co-evaluation               End of Session Equipment Utilized During Treatment: Gait belt Activity Tolerance: Patient tolerated treatment well Patient left: in chair;with call bell/phone within reach;with family/visitor present Nurse Communication: Mobility status         Time: 1020-1043 PT Time Calculation (min): 23 min   Charges:   PT Evaluation $Initial PT Evaluation Tier I: 1 Procedure PT Treatments $Gait Training: 8-22 mins $Therapeutic Activity: 8-22 mins   PT G Codes:          Mikayla Villanueva 01/15/2014, 1:29 PM

## 2014-01-15 NOTE — Progress Notes (Signed)
[facility] PROGRESS NOTE  Mikayla HerrlichFaye Villanueva FAO:[id number] DOB: 11/22/1920 DOA: 01/14/2014 PCP: Mikayla RuaMEYERS, STEPHEN, MD  Assessment/Plan: 1. Compression Fractures -Physical therapy consulted -She would like to avoid narcotic analgesics if possible for pain management -Lidocaine Patch ordered  -Supportive care  2. Chronic Lower Extremity Ulcers -Wound care consulted -She recently completed course of antibiotic therapy  -She does not appear to have active infection -There is erythema present but this is bilateral and appeared to get better over night with the decrease in swelling involving lower extremities. -She is afebrile AM labs showing WBC of 9.8 -Will discontinue IV vancomycin  3. UTI -Will start Ceftin 250 mg PO BID  Code Status: DNR Family Communication: Spoke with her daughter at bedside Disposition Plan: Supportive care, PT consult   Consultants:  Physical Therapy  Antibiotics:  Ceftin  HPI/Subjective: Mikayla Villanueva is a 80 y.o. female has a past medical history significant for chronic back pain due to compression fractures, LE venous stasis and cellulitis s/p recently completed antibiotics, HTN, HLD, hypothyroidism, presented to the ED with weakness and back pain.  This morning she complains of ongoing back pain. Her daughter who is present at bedside reports significant improvement to lower extremity swelling and erythema. She ambulated with Physical Therapy    Objective: Filed Vitals:   01/15/14 1013  BP: 161/71  Pulse:   Temp:   Resp:     Intake/Output Summary (Last 24 hours) at 01/15/14 1312 Last data filed at 01/15/14 0827  Gross per 24 hour  Intake 809.67 ml  Output      0 ml  Net 809.67 ml   Filed Weights   01/14/14 1343  Weight: 65.7 kg (144 lb 13.5 oz)    Exam:   General:  She is in no acute distress, awake and alert, complains of ongoing back pain  Cardiovascular: Regular rate and rhythm, normal S1S2  Respiratory: Clear to  auscultation  Abdomen: Soft, nontender nondistended  Musculoskeletal: Improved bilateral lower extremity edema, there is bilateral erythema present, chronic ulceration to shin, no purulence or fluctuance  Data Reviewed: Basic Metabolic Panel:  Recent Labs Lab 01/14/14 0955 01/15/14 0458  NA 130* 138  K 3.4* 4.0  CL 94* 99  CO2 23 28  GLUCOSE 163* 115*  BUN 23 20  CREATININE 0.58 0.60  CALCIUM 8.6 8.6   Liver Function Tests:  Recent Labs Lab 01/14/14 0955  AST 22  ALT 17  ALKPHOS 117  BILITOT 0.7  PROT 6.8  ALBUMIN 3.4*   No results found for this basename: LIPASE, AMYLASE,  in the last 168 hours No results found for this basename: AMMONIA,  in the last 168 hours CBC:  Recent Labs Lab 01/14/14 0955 01/15/14 0458  WBC 11.2* 9.8  NEUTROABS 8.3*  --   HGB 13.2 13.4  HCT 40.2 40.0  MCV 96.6 97.8  PLT 279 244   Cardiac Enzymes:  Recent Labs Lab 01/14/14 0955  CKTOTAL 63   BNP (last 3 results) No results found for this basename: PROBNP,  in the last 8760 hours CBG: No results found for this basename: GLUCAP,  in the last 168 hours  No results found for this or any previous visit (from the past 240 hour(s)).   Studies: Dg Thoracic Spine 2 View  01/14/2014   CLINICAL DATA:  Back pain  EXAM: THORACIC SPINE - 2 VIEW  COMPARISON:  October 30, 2013  FINDINGS: Severe chronic fracture of T8 is unchanged.  New fracture T10 with a moderate compression  fracture. This may be an acute fracture.  Mild compression fracture T12 is unchanged and appears chronic.  Generalized osteopenia.  IMPRESSION: New fracture T10 compared with 10/30/2013.   Electronically Signed   By: Marlan Palauharles  Clark M.D.   On: 01/14/2014 11:41   Dg Lumbar Spine Complete  01/14/2014   CLINICAL DATA:  Pain  EXAM: LUMBAR SPINE - COMPLETE 4+ VIEW  COMPARISON:  CT abdomen pelvis 05/27/2013.  FINDINGS: The bones are osteopenic. Interval development of a compression deformity involving L1 appears to be in the main  tube of 25-30% vertebral body height loss. Age indeterminate. Chronic compression deformity involving T12. Multilevel degenerative disc disease changes most prominent at L4-5. Stable Schmorl's node at L3. A left hip prostheses partially visualized grossly unremarkable.  IMPRESSION: Which compression deformity involving L1 age indeterminate. Correlation when point tenderness and history recommended.  Chronic multilevel degenerative disc disease changes and osteopenia.  Chronic compression deformity involving T12.   Electronically Signed   By: Salome HolmesHector  Cooper M.D.   On: 01/14/2014 11:48    Scheduled Meds: . furosemide  40 mg Oral QODAY  . heparin  5,000 Units Subcutaneous 3 times per day  . levothyroxine  88 mcg Oral QAC breakfast  . lidocaine  1 patch Transdermal Q24H  . lisinopril  20 mg Oral Daily  . metoprolol succinate  50 mg Oral Daily  .  morphine injection  2 mg Intravenous Once  . mupirocin ointment   Topical BID  . ondansetron (ZOFRAN) IV  4 mg Intravenous Once  . simvastatin  40 mg Oral QPM  . tiotropium  18 mcg Inhalation Daily   Continuous Infusions: . sodium chloride 20 mL/hr at 01/14/14 1347    Active Problems:   Hypertension   COPD (chronic obstructive pulmonary disease)   UTI (lower urinary tract infection)   Unspecified hypothyroidism   Compression fracture   Cellulitis    Time spent: 35 min    Mikayla Villanueva  [facility] Pager [phone]. If 7PM-7AM, please contact night-coverage at [email or web address], password [email or web address] 01/15/2014, 1:12 PM  LOS: 1 day

## 2014-01-15 NOTE — Consult Note (Signed)
WOC wound consult note Reason for Consult:Bilateral lateral LE ulcerations, chronic Wound type: Likely mixed etiology, venous insufficiency and arterial insufficiency. Pressure Ulcer POA: No Measurement:Right lateral LE:  3cm x 2.5cm x 0.4cm.  Left lateral LE:  3cm x 2.5cm x 0.2cm Wound FAO:ZHYQMbed:Right lateral LE is red, moist, free of necrotic tissue.  Left lateral LE is covered with dried serum Drainage (amount, consistency, odor) None at this time Periwound:intact, dry, mild erythema Dressing procedure/placement/frequency: I will continue a variation of her POC from her PCP, ie., mupirocin ointment toped with dry gauze and changed twice daily.  I will add a pressure reduction chair cushion as she typically sleeps in her recliner chair at home instead of the bed.  We will add light compression with 6-inch ACE wraps from toe to knee to assist with fluid mobilization. [facility] and I suggest this continue post discharge. WOC nursing team will not follow, but will remain available to this patient, the nursing and medical teams.  Please re-consult if needed. Thanks, Ladona MowLaurie Isaid Salvia, MSN, RN, GNP, [place]OCN, CWON-AP [phone])

## 2014-01-16 DIAGNOSIS — W07XXXA Fall from chair, initial encounter: Secondary | ICD-10-CM

## 2014-01-16 LAB — CBC
HCT: 40.2 % (ref 36.0–46.0)
Hemoglobin: 13.1 g/dL (ref 12.0–15.0)
MCH: 32 pg (ref 26.0–34.0)
MCHC: 32.6 g/dL (ref 30.0–36.0)
MCV: 98.3 fL (ref 78.0–100.0)
PLATELETS: 267 10*3/uL (ref 150–400)
RBC: 4.09 MIL/uL (ref 3.87–5.11)
RDW: 13.9 % (ref 11.5–15.5)
WBC: 8.5 10*3/uL (ref 4.0–10.5)

## 2014-01-16 LAB — BASIC METABOLIC PANEL
Anion gap: 9 (ref 5–15)
BUN: 18 mg/dL (ref 6–23)
CO2: 29 mEq/L (ref 19–32)
Calcium: 8.7 mg/dL (ref 8.4–10.5)
Chloride: 102 mEq/L (ref 96–112)
Creatinine, Ser: 0.63 mg/dL (ref 0.50–1.10)
GFR calc Af Amer: 89 mL/min — ABNORMAL LOW (ref >90)
GFR, EST NON AFRICAN AMERICAN: 77 mL/min — AB (ref >90)
GLUCOSE: 138 mg/dL — AB (ref 70–99)
POTASSIUM: 3.9 meq/L (ref 3.7–5.3)
Sodium: 140 mEq/L (ref 137–147)

## 2014-01-16 MED ORDER — CEFUROXIME AXETIL 250 MG PO TABS: 250.0000 mg | ORAL_TABLET | Freq: Two times a day (BID) | ORAL | Status: DC

## 2014-01-16 MED ORDER — IBUPROFEN 600 MG PO TABS: 600.0000 mg | ORAL_TABLET | Freq: Three times a day (TID) | ORAL | Status: DC | PRN

## 2014-01-16 MED ORDER — ACETAMINOPHEN 500 MG PO TABS
1000.0000 mg | ORAL_TABLET | Freq: Three times a day (TID) | ORAL | Status: DC
  Administered 2014-01-16: 1000 mg via ORAL
  Filled 2014-01-16: qty 2

## 2014-01-16 MED ORDER — ACETAMINOPHEN 500 MG PO TABS: 1000.0000 mg | ORAL_TABLET | Freq: Three times a day (TID) | ORAL | Status: DC

## 2014-01-16 MED ORDER — IBUPROFEN 600 MG PO TABS
600.0000 mg | ORAL_TABLET | Freq: Once | ORAL | Status: AC
  Administered 2014-01-16: 600 mg via ORAL
  Filled 2014-01-16: qty 1

## 2014-01-16 MED ORDER — IBUPROFEN 600 MG PO TABS
600.0000 mg | ORAL_TABLET | Freq: Three times a day (TID) | ORAL | Status: DC | PRN
  Filled 2014-01-16: qty 1

## 2014-01-16 NOTE — Discharge Summary (Signed)
Physician Discharge Summary  Mikayla Villanueva WJX:[id number] DOB: [birth year] DOA: 01/14/2014  PCP: Joycelyn Rua, MD  Admit date: 01/14/2014 Discharge date: 01/16/2014  Time spent: 35 minutes  Recommendations for Outpatient Follow-up:  1. Please follow up on patient's pain symptoms, as she presented with intractable back pain after having a fall at home. Has history of compression fractures 2. Has history of chronic lower extremity ulcerations, HH services for RN and PT requested prior to discharge  Discharge Diagnoses:  Active Problems:   Hypertension   COPD (chronic obstructive pulmonary disease)   UTI (lower urinary tract infection)   Unspecified hypothyroidism   Compression fracture   Cellulitis   Discharge Condition: Stable  Diet recommendation: Heart Healthy  Filed Weights   01/14/14 1343  Weight: 65.7 kg (144 lb 13.5 oz)    History of present illness:  Mikayla Villanueva is a 80 y.o. female has a past medical history significant for chronic back pain due to compression fractures, LE venous stasis and cellulitis s/p recently completed antibiotics, HTN, HLD, hypothyroidism, presents to the ED with weakness and back pain. About couple of days prior she felt a pop in her back when she bent over and has been having back pain since. She sleeps in the recliner usually due to chronic back pain and last night she slid off and was unable to get up for few hours. She denies any fever or chills, has had intermittent dysuria (none recently), denies any chest pain or breathing difficulties. Denies abdominal pain, nausea or vomiting.   Hospital Course:  Patient is a pleasant 80 year old female with a past medical history of compression fractures, chronic lower extremity venous stasis ulcers, HTN who was admitted to the medicine service on 01/14/2014. She presented with complaints of severe lower back pain that started after sliding off of her recliner. X-ray of lumbar spine showed compression deformity  involving L1 age indeterminate and chronic multilevel degenerative disc disease changes. She was seen and evaluated by physical therapy during this hospitalization. Patient did not wish to take narcotic analgesics for pain management. She was placed on scheduled tylenol with PRN ibuprofen. On 01/16/2014 she reported feeling better, ambulated with physical therapy. Prior to discharge home health swervices were set up for home Pt and RN. She was discharged home in stable condition on 01/16/2014.   Consultations:  Physical Therapy  Discharge Exam: Filed Vitals:   01/16/14 0550  BP: 155/82  Pulse: 79  Temp: 98.1 F (36.7 C)  Resp: 18    General: No acute distress, reported feeling better after getting ibuprofen.  Cardiovascular: Regular rate and rhythm, normal S1S2 Respiratory: Clear to auscultation bilaterally  Discharge Instructions You were cared for by a hospitalist during your hospital stay. If you have any questions about your discharge medications or the care you received while you were in the hospital after you are discharged, you can call the unit and asked to speak with the hospitalist on call if the hospitalist that took care of you is not available. Once you are discharged, your primary care physician will handle any further medical issues. Please note that NO REFILLS for any discharge medications will be authorized once you are discharged, as it is imperative that you return to your primary care physician (or establish a relationship with a primary care physician if you do not have one) for your aftercare needs so that they can reassess your need for medications and monitor your lab values.  Discharge Instructions   Call MD for:  difficulty breathing, headache or visual disturbances    Complete by:  As directed      Call MD for:  extreme fatigue    Complete by:  As directed      Call MD for:  persistant dizziness or light-headedness    Complete by:  As directed      Call MD for:   persistant nausea and vomiting    Complete by:  As directed      Call MD for:  severe uncontrolled pain    Complete by:  As directed      Call MD for:  temperature >100.4    Complete by:  As directed      Diet - low sodium heart healthy    Complete by:  As directed      Increase activity slowly    Complete by:  As directed             Medication List    STOP taking these medications       HYDROcodone-acetaminophen 5-325 MG per tablet  Commonly known as:  NORCO/VICODIN      TAKE these medications       acetaminophen 500 MG tablet  Commonly known as:  TYLENOL  Take 2 tablets (1,000 mg total) by mouth 3 (three) times daily.     cadexomer iodine 0.9 % gel  Commonly known as:  IODOSORB  Apply 1 application topically 3 (three) times a week.     cefUROXime 250 MG tablet  Commonly known as:  CEFTIN  Take 1 tablet (250 mg total) by mouth 2 (two) times daily with a meal.     furosemide 40 MG tablet  Commonly known as:  LASIX  Take 40 mg by mouth every other day.     ibuprofen 600 MG tablet  Commonly known as:  ADVIL,MOTRIN  Take 1 tablet (600 mg total) by mouth every 8 (eight) hours as needed for moderate pain.     levothyroxine 88 MCG tablet  Commonly known as:  SYNTHROID, LEVOTHROID  Take 88 mcg by mouth daily.     lisinopril 20 MG tablet  Commonly known as:  PRINIVIL,ZESTRIL  Take 20 mg by mouth daily.     metoprolol succinate 50 MG 24 hr tablet  Commonly known as:  TOPROL-XL  Take 50 mg by mouth daily. Take with or immediately following a meal.     mupirocin ointment 2 %  Commonly known as:  BACTROBAN  Place 1 application into the nose 2 (two) times daily.     simvastatin 40 MG tablet  Commonly known as:  ZOCOR  Take 40 mg by mouth every evening.     tiotropium 18 MCG inhalation capsule  Commonly known as:  SPIRIVA  Place 18 mcg into inhaler and inhale daily.       Allergies  Allergen Reactions  . Benadryl [Diphenhydramine]     hallicuinates  .  Codeine     hallucinates  . Morphine And Related     hallucinates  . Sulfa Antibiotics Hives  . Tramadol Other (See Comments)    Makes legs swell   . Nitrofurantoin Monohyd Macro Hives       Follow-up Information   Follow up with [facility]. [facility](Home Health RN and Physical Therapy)    Contact information:   [address] [place] [address] [phone]        The results of significant diagnostics from this hospitalization (including imaging, microbiology, ancillary and  laboratory) are listed below for reference.    Significant Diagnostic Studies: Dg Thoracic Spine 2 View  01/14/2014   CLINICAL DATA:  Back pain  EXAM: THORACIC SPINE - 2 VIEW  COMPARISON:  October 30, 2013  FINDINGS: Severe chronic fracture of T8 is unchanged.  New fracture T10 with a moderate compression fracture. This may be an acute fracture.  Mild compression fracture T12 is unchanged and appears chronic.  Generalized osteopenia.  IMPRESSION: New fracture T10 compared with 10/30/2013.   Electronically Signed   By: Marlan Palauharles  Clark M.D.   On: 01/14/2014 11:41   Dg Lumbar Spine Complete  01/14/2014   CLINICAL DATA:  Pain  EXAM: LUMBAR SPINE - COMPLETE 4+ VIEW  COMPARISON:  CT abdomen pelvis 05/27/2013.  FINDINGS: The bones are osteopenic. Interval development of a compression deformity involving L1 appears to be in the main tube of 25-30% vertebral body height loss. Age indeterminate. Chronic compression deformity involving T12. Multilevel degenerative disc disease changes most prominent at L4-5. Stable Schmorl's node at L3. A left hip prostheses partially visualized grossly unremarkable.  IMPRESSION: Which compression deformity involving L1 age indeterminate. Correlation when point tenderness and history recommended.  Chronic multilevel degenerative disc disease changes and osteopenia.  Chronic compression deformity involving T12.   Electronically Signed   By: Salome HolmesHector  Cooper M.D.   On: 01/14/2014  11:48    Microbiology: Recent Results (from the past 240 hour(s))  URINE CULTURE     Status: None   Collection Time    01/14/14 10:41 AM      Result Value Ref Range Status   Specimen Description URINE, CATHETERIZED   Final   Special Requests NONE   Final   Culture  Setup Time     Final   Value: 01/14/2014 14:06     [facility]formed at [facility]   Colony Count     Final   Value: 40,000 COLONIES/ML     [facility]formed at [facility]   Culture     Final   Value: Multiple bacterial morphotypes present, none predominant. Suggest appropriate recollection if clinically indicated.     Performed at [facility]   Report Status 01/15/2014 FINAL   Final     Labs: Basic Metabolic Panel:  Recent Labs Lab 01/14/14 0955 01/15/14 0458 01/16/14 0518  NA 130* 138 140  K 3.4* 4.0 3.9  CL 94* 99 102  CO2 23 28 29   GLUCOSE 163* 115* 138*  BUN 23 20 18   CREATININE 0.58 0.60 0.63  CALCIUM 8.6 8.6 8.7   Liver Function Tests:  Recent Labs Lab 01/14/14 0955  AST 22  ALT 17  ALKPHOS 117  BILITOT 0.7  PROT 6.8  ALBUMIN 3.4*   No results found for this basename: LIPASE, AMYLASE,  in the last 168 hours No results found for this basename: AMMONIA,  in the last 168 hours CBC:  Recent Labs Lab 01/14/14 0955 01/15/14 0458 01/16/14 0518  WBC 11.2* 9.8 8.5  NEUTROABS 8.3*  --   --   HGB 13.2 13.4 13.1  HCT 40.2 40.0 40.2  MCV 96.6 97.8 98.3  PLT 279 244 267   Cardiac Enzymes:  Recent Labs Lab 01/14/14 0955  CKTOTAL 63   BNP: BNP (last 3 results) No results found for this basename: PROBNP,  in the last 8760 hours CBG: No results found for this basename: GLUCAP,  in the last 168 hours     Signed:  Jeralyn BennettZAMORA, Khyrin Trevathan  [facility] 01/16/2014, 2:09  PM

## 2014-01-16 NOTE — Progress Notes (Signed)
CARE MANAGEMENT NOTE 01/16/2014  Patient:  Mikayla Villanueva,Mikayla Villanueva   Account Number:  [id number]  Date Initiated:  01/14/2014  Documentation initiated by:  DAVIS,RHONDA  Subjective/Objective Assessment:   pt admitted with back pain , hx of foot ulcers, and now with new compression fracture of t1,urinary tract infection -sepsis     Action/Plan:   [facility]   Anticipated DC Date:  01/17/2014   Anticipated DC Plan:  HOME W HOME HEALTH SERVICES  In-house referral  NA      DC Planning Services  CM consult      [facility]  Resumption Of Svcs/PTA Provider   Choice offered to / List presented to:  NA      DME agency  [facility]     HH arranged  HH-1 RN  HH-2 PT      [facility]   Status of service:  Completed, signed off Medicare Important Message given?  NA - LOS <3 / Initial given by admissions (If response is "NO", the following Medicare IM given date fields will be blank) Date Medicare IM given:   Medicare IM given by:   Date Additional Medicare IM given:   Additional Medicare IM given by:    Discharge Disposition:  HOME W HOME HEALTH SERVICES  Per UR Regulation:  Reviewed for med. necessity/level of care/duration of stay  If discussed at Long Length of Stay Meetings, dates discussed:    Comments:  01/16/2014 1500 NCM spoke to pt and [facility]. States no DME is needed for home. Gave permission to speak with dtr, Nigel MormonBarbara Marshall. Dtr states she is there to assist her pt at home. [facility]. Isidoro DonningAlesia Freddi Schrager RN CCM [facility] phone [phone]  [facility] HospitalRhonda DAvis,RN,BSN,CCM

## 2014-01-16 NOTE — Progress Notes (Signed)
01/16/14 1200  PT Visit Information  Last PT Received On 01/16/14  Assistance Needed +1  History of Present Illness 80 yo female adm after near fall out of recliner and comp fx at T10 (new); PMHx: chronic T8 comp fx, HTN, osteoporsis, LE venous stasis ulcers-cellulitis, peripheral neuropathy  PT Time Calculation  PT Start Time 1116  PT Stop Time 1140  PT Time Calculation (min) 24 min  Subjective Data  Patient Stated Goal home and pt to be able to amb short household distance if possible per dtr  Precautions  Precautions Back  Cognition  Arousal/Alertness Awake/alert  Behavior During Therapy WFL for tasks assessed/performed  Overall Cognitive Status Within Functional Limits for tasks assessed  Transfers  Overall transfer level Needs assistance  Equipment used Rolling walker (2 wheeled)  Transfers Sit to/from Stand  Sit to Stand Supervision;Min guard  General transfer comment cues for hand placement,incr time for transition  Ambulation/Gait  Ambulation/Gait assistance Min guard;Min assist  Ambulation Distance (Feet) 160 Feet  Assistive device Rolling walker (2 wheeled)  Gait Pattern/deviations Step-to pattern;Shuffle;Trunk flexed  Gait velocity decr  General Gait Details cues for RW position, use of UEs, posture.  Increased cueing and assist for turns.  PT - End of Session  Equipment Utilized During Treatment Gait belt  Activity Tolerance Patient tolerated treatment well  Patient left in chair;with call bell/phone within reach;with family/visitor present  Nurse Communication Mobility status  PT - Assessment/Plan  PT Plan Current plan remains appropriate  PT Frequency Min 4X/week  Follow Up Recommendations Home health PT;Supervision for mobility/OOB  PT equipment None recommended by PT  PT Goal Progression  Progress towards PT goals Progressing toward goals  Acute Rehab PT Goals  PT Goal Formulation With patient  Time For Goal Achievement 01/20/14  Potential to Achieve  Goals Good  PT General Charges  $$ ACUTE PT VISIT 1 Procedure  PT Treatments  $Gait Training 23-37 mins

## 2014-01-16 NOTE — Progress Notes (Signed)
Physical Therapy Treatment Patient Details Name: Mikayla HerrlichFaye Villanueva MRN: [id number] DOB: 03/29/1921 Today's Date: 01/16/2014    History of Present Illness 80 yo female adm after near fall out of recliner and comp fx at T10 (new); PMHx: chronic T8 comp fx, HTN, osteoporsis, LE venous stasis ulcers-cellulitis, peripheral neuropathy    PT Comments    Pt not comfortable on EOB, willing to get to chair but waiting on additional pain meds prior to attempting further amb;  Follow Up Recommendations  Home health PT;Supervision for mobility/OOB     Equipment Recommendations  None recommended by PT    Recommendations for Other Services       Precautions / Restrictions Precautions Precautions: Back    Mobility  Bed Mobility               General bed mobility comments: pt reclined on EOB with multiple pillows  Transfers   Equipment used: Rolling walker (2 wheeled) Transfers: Sit to/from Stand Sit to Stand: Min guard         General transfer comment: cues for hand placement,incr time for transition  Ambulation/Gait Ambulation/Gait assistance: Min assist;Min guard Ambulation Distance (Feet): 15 Feet Assistive device: Rolling walker (2 wheeled) Gait Pattern/deviations: Step-to pattern;Decreased step length - right;Decreased step length - left;Trunk flexed     General Gait Details: cues for RW position, use of UEs, posture.  Increased cueing and assist for turns.   Stairs            Wheelchair Mobility    Modified Rankin (Stroke Patients Only)       Balance                                    Cognition Arousal/Alertness: Awake/alert Behavior During Therapy: WFL for tasks assessed/performed Overall Cognitive Status: Within Functional Limits for tasks assessed                      Exercises  ankle pumps x 20 LAQs x 15 bil LEs    General Comments        Pertinent Vitals/Pain Pain 8/10    Home Living                       Prior Function            PT Goals (current goals can now be found in the care plan section) Acute Rehab PT Goals Patient Stated Goal: home and pt to be able to amb short household distance if possible per dtr PT Goal Formulation: With patient Time For Goal Achievement: 01/20/14 Potential to Achieve Goals: Good Progress towards PT goals: Progressing toward goals    Frequency  Min 4X/week    PT Plan Current plan remains appropriate    Co-evaluation             End of Session Equipment Utilized During Treatment: Gait belt Activity Tolerance: Patient limited by pain Patient left: in chair;with call bell/phone within reach;with family/visitor present     Time: [id number] PT Time Calculation (min): 13 min  Charges:  $Gait Training: 8-22 mins                    G Codes:      Mikayla Villanueva 01/16/2014, 10:48 AM

## 2014-10-19 ENCOUNTER — Other Ambulatory Visit: Payer: Self-pay | Admitting: Family Medicine

## 2014-11-15 ENCOUNTER — Emergency Department (HOSPITAL_COMMUNITY)
Admission: EM | Admit: 2014-11-15 | Discharge: 2014-11-15 | Disposition: A | Discharge status: Home / Self Care | Payer: Medicare Other | Attending: Emergency Medicine | Admitting: Emergency Medicine

## 2014-11-15 ENCOUNTER — Emergency Department (HOSPITAL_COMMUNITY): Payer: Medicare Other

## 2014-11-15 ENCOUNTER — Encounter (HOSPITAL_COMMUNITY): Payer: Self-pay | Admitting: Emergency Medicine

## 2014-11-15 DIAGNOSIS — I10 Essential (primary) hypertension: Secondary | ICD-10-CM | POA: Diagnosis not present

## 2014-11-15 DIAGNOSIS — E079 Disorder of thyroid, unspecified: Secondary | ICD-10-CM | POA: Insufficient documentation

## 2014-11-15 DIAGNOSIS — R531 Weakness: Secondary | ICD-10-CM | POA: Diagnosis not present

## 2014-11-15 DIAGNOSIS — Z90710 Acquired absence of both cervix and uterus: Secondary | ICD-10-CM | POA: Insufficient documentation

## 2014-11-15 DIAGNOSIS — K5732 Diverticulitis of large intestine without perforation or abscess without bleeding: Secondary | ICD-10-CM | POA: Diagnosis not present

## 2014-11-15 DIAGNOSIS — M1990 Unspecified osteoarthritis, unspecified site: Secondary | ICD-10-CM | POA: Insufficient documentation

## 2014-11-15 DIAGNOSIS — E780 Pure hypercholesterolemia: Secondary | ICD-10-CM | POA: Insufficient documentation

## 2014-11-15 DIAGNOSIS — E785 Hyperlipidemia, unspecified: Secondary | ICD-10-CM | POA: Insufficient documentation

## 2014-11-15 DIAGNOSIS — R109 Unspecified abdominal pain: Secondary | ICD-10-CM | POA: Diagnosis present

## 2014-11-15 DIAGNOSIS — E039 Hypothyroidism, unspecified: Secondary | ICD-10-CM | POA: Insufficient documentation

## 2014-11-15 DIAGNOSIS — Z79899 Other long term (current) drug therapy: Secondary | ICD-10-CM | POA: Diagnosis not present

## 2014-11-15 LAB — CBC WITH DIFFERENTIAL/PLATELET
Basophils Absolute: 0 10*3/uL (ref 0.0–0.1)
Basophils Relative: 0 % (ref 0–1)
Eosinophils Absolute: 0.1 10*3/uL (ref 0.0–0.7)
Eosinophils Relative: 1 % (ref 0–5)
HCT: 46.9 % — ABNORMAL HIGH (ref 36.0–46.0)
Hemoglobin: 15.4 g/dL — ABNORMAL HIGH (ref 12.0–15.0)
LYMPHS PCT: 17 % (ref 12–46)
Lymphs Abs: 1.8 10*3/uL (ref 0.7–4.0)
MCH: 32.4 pg (ref 26.0–34.0)
MCHC: 32.8 g/dL (ref 30.0–36.0)
MCV: 98.5 fL (ref 78.0–100.0)
MONOS PCT: 8 % (ref 3–12)
Monocytes Absolute: 0.8 10*3/uL (ref 0.1–1.0)
NEUTROS PCT: 74 % (ref 43–77)
Neutro Abs: 7.6 10*3/uL (ref 1.7–7.7)
Platelets: 233 10*3/uL (ref 150–400)
RBC: 4.76 MIL/uL (ref 3.87–5.11)
RDW: 13.5 % (ref 11.5–15.5)
WBC: 10.3 10*3/uL (ref 4.0–10.5)

## 2014-11-15 LAB — URINALYSIS, ROUTINE W REFLEX MICROSCOPIC
Bilirubin Urine: NEGATIVE
GLUCOSE, UA: NEGATIVE mg/dL
Ketones, ur: NEGATIVE mg/dL
Leukocytes, UA: NEGATIVE
NITRITE: NEGATIVE
PROTEIN: 30 mg/dL — AB
Specific Gravity, Urine: 1.013 (ref 1.005–1.030)
Urobilinogen, UA: 0.2 mg/dL (ref 0.0–1.0)
pH: 8 (ref 5.0–8.0)

## 2014-11-15 LAB — LIPASE, BLOOD: LIPASE: 22 U/L (ref 22–51)

## 2014-11-15 LAB — COMPREHENSIVE METABOLIC PANEL
ALT: 22 U/L (ref 14–54)
AST: 27 U/L (ref 15–41)
Albumin: 3.9 g/dL (ref 3.5–5.0)
Alkaline Phosphatase: 78 U/L (ref 38–126)
Anion gap: 8 (ref 5–15)
BILIRUBIN TOTAL: 1.5 mg/dL — AB (ref 0.3–1.2)
BUN: 21 mg/dL — ABNORMAL HIGH (ref 6–20)
CO2: 29 mmol/L (ref 22–32)
Calcium: 9.2 mg/dL (ref 8.9–10.3)
Chloride: 98 mmol/L — ABNORMAL LOW (ref 101–111)
Creatinine, Ser: 0.74 mg/dL (ref 0.44–1.00)
GFR calc non Af Amer: >60 mL/min (ref >60)
GLUCOSE: 140 mg/dL — AB (ref 70–99)
POTASSIUM: 4.9 mmol/L (ref 3.5–5.1)
Sodium: 135 mmol/L (ref 135–145)
Total Protein: 7.3 g/dL (ref 6.5–8.1)

## 2014-11-15 LAB — URINE MICROSCOPIC-ADD ON

## 2014-11-15 MED ORDER — METRONIDAZOLE 500 MG PO TABS: 500.0000 mg | ORAL_TABLET | Freq: Two times a day (BID) | ORAL | Status: DC

## 2014-11-15 MED ORDER — IOHEXOL 300 MG/ML  SOLN
25.0000 mL | INTRAMUSCULAR | Status: AC
  Administered 2014-11-15: 25 mL via ORAL

## 2014-11-15 MED ORDER — IOHEXOL 300 MG/ML  SOLN
80.0000 mL | Freq: Once | INTRAMUSCULAR | Status: AC | PRN
  Administered 2014-11-15: 80 mL via INTRAVENOUS

## 2014-11-15 MED ORDER — CIPROFLOXACIN HCL 500 MG PO TABS: 500.0000 mg | ORAL_TABLET | Freq: Two times a day (BID) | ORAL | Status: DC

## 2014-11-15 NOTE — ED Notes (Addendum)
Pt arrives EMS from with abdominal pain last night after dinner. Pain is intermittent, lasting only a few seconds sharp in natures. Denies chills, fever. C/o nausea and weakness at present. Pt awake, alert, oriented x 3. 4mg  zofran, 20g LAC.

## 2014-11-15 NOTE — ED Provider Notes (Signed)
CSN: [id number]     Arrival date & time 11/15/14  1153 History   [id number] MD Initiated Contact with Patient 11/15/14 1203     Chief Complaint  Patient presents with  . Abdominal Pain  . Nausea  . Fatigue     (Consider location/radiation/quality/duration/timing/severity/associated sxs/prior Treatment) HPI Mr. Fuhriman is a 81 y.o female with a history of HTN, Hyperlipidemia, thyroid disease, diverticulitis, and arthritis who presents for intermittent sharp left sided abdominal pain since last night.  She states she has also had some nausea and is feeling fatigued.  She is in no pain now. She has taken tylenol for pain. She lives on her own two doors down from her daughter.  She states she is able to care for herself normally and does her own housework. She denies any fever, chest pain, shortness of breath, vomiting, dysuria, hematuria, or leg swelling. Past Medical History  Diagnosis Date  . Thyroid disease   . Hypertension   . Hyperlipemia   . Diverticulitis   . Arthritis   . Osteoporosis   . Hypothyroidism   . Hypercholesteremia    Past Surgical History  Procedure Laterality Date  . Back surgery    . Abdominal hysterectomy    . Ankle surgery    . Eye surgery    . Broken wrist Left     left, Dr. Cleophas DunkerWhitfield,  . Hip arthroplasty Left 06/07/2013    Procedure: ARTHROPLASTY BIPOLAR HIP;  Surgeon: Loanne DrillingFrank V Aluisio, MD;  Location: [facility];  Service: Orthopedics;  Laterality: Left;   Family History  Problem Relation Age of Onset  . Heart disease Mother   . Stroke Father    History  Substance Use Topics  . Smoking status: Never Smoker   . Smokeless tobacco: Never Used  . Alcohol Use: No   OB History    No data available     Review of Systems  Respiratory: Negative for shortness of breath.   Cardiovascular: Negative for chest pain.  Genitourinary: Negative for dysuria, frequency, hematuria, vaginal bleeding and difficulty urinating.  Neurological: Positive for weakness. Negative for  dizziness and light-headedness.  All other systems reviewed and are negative.     Allergies  Benadryl; Codeine; Morphine and related; Sulfa antibiotics; Tramadol; and Nitrofurantoin monohyd macro  Home Medications   Prior to Admission medications   Medication Sig Start Date End Date Taking? Authorizing Provider  acetaminophen (TYLENOL) 500 MG tablet Take 2 tablets (1,000 mg total) by mouth 3 (three) times daily. 01/16/14  Yes Jeralyn BennettEzequiel Zamora, MD  clobetasol ointment (TEMOVATE) 0.05 % Apply 1 application topically 2 (two) times daily as needed. rash 11/08/14  Yes Historical Provider, MD  ibuprofen (ADVIL,MOTRIN) 600 MG tablet Take 1 tablet (600 mg total) by mouth every 8 (eight) hours as needed for moderate pain. 01/16/14  Yes Jeralyn BennettEzequiel Zamora, MD  levothyroxine (SYNTHROID, LEVOTHROID) 88 MCG tablet Take 88 mcg by mouth daily.    Yes Historical Provider, MD  lisinopril (PRINIVIL,ZESTRIL) 20 MG tablet Take 20 mg by mouth daily.   Yes Historical Provider, MD  metoprolol succinate (TOPROL-XL) 50 MG 24 hr tablet Take 50 mg by mouth daily. Take with or immediately following a meal.   Yes Historical Provider, MD  simvastatin (ZOCOR) 40 MG tablet Take 40 mg by mouth every evening.   Yes Historical Provider, MD  tiotropium (SPIRIVA) 18 MCG inhalation capsule Place 18 mcg into inhaler and inhale daily.   Yes Historical Provider, MD  cefUROXime (CEFTIN) 250 MG tablet Take 1  tablet (250 mg total) by mouth 2 (two) times daily with a meal. Patient not taking: Reported on 11/15/2014 01/16/14   Jeralyn Bennett, MD  ciprofloxacin (CIPRO) 500 MG tablet Take 1 tablet (500 mg total) by mouth every 12 (twelve) hours. 11/15/14   Christian Treadway Patel-Mills, PA-C  metroNIDAZOLE (FLAGYL) 500 MG tablet Take 1 tablet (500 mg total) by mouth 2 (two) times daily. 11/15/14   Sable Knoles Patel-Mills, PA-C   BP 169/83 mmHg  Pulse 83  Temp(Src) 98.6 F (37 C) (Oral)  Resp 25  SpO2 97% Physical Exam  Constitutional: She is oriented to person,  place, and time. She appears well-developed and well-nourished.  HENT:  Head: Normocephalic and atraumatic.  Eyes: Conjunctivae are normal.  Neck: Normal range of motion. Neck supple.  Cardiovascular: Normal rate, regular rhythm and normal heart sounds.   Pulmonary/Chest: Effort normal and breath sounds normal.  Abdominal: Soft. Normal appearance. She exhibits no distension and no mass. There is no tenderness. There is no guarding.  No reproducible tenderness of exam.   Musculoskeletal: Normal range of motion.  Neurological: She is alert and oriented to person, place, and time.  Skin: Skin is warm and dry.  Nursing note and vitals reviewed.   ED Course  Procedures (including critical care time) Labs Review Labs Reviewed  CBC WITH DIFFERENTIAL/PLATELET - Abnormal; Notable for the following:    Hemoglobin 15.4 (*)    HCT 46.9 (*)    All other components within normal limits  COMPREHENSIVE METABOLIC PANEL - Abnormal; Notable for the following:    Chloride 98 (*)    Glucose, Bld 140 (*)    BUN 21 (*)    Total Bilirubin 1.5 (*)    All other components within normal limits  URINALYSIS, ROUTINE W REFLEX MICROSCOPIC - Abnormal; Notable for the following:    APPearance CLOUDY (*)    Hgb urine dipstick TRACE (*)    Protein, ur 30 (*)    All other components within normal limits  URINE MICROSCOPIC-ADD ON - Abnormal; Notable for the following:    Bacteria, UA MANY (*)    All other components within normal limits  LIPASE, BLOOD    Imaging Review Ct Abdomen Pelvis W Contrast  11/15/2014   CLINICAL DATA:  Postprandial abdominal pain after dinner last night, nausea, weakness, back pain, history hypertension, hyperlipidemia, diverticulitis  EXAM: CT ABDOMEN AND PELVIS WITH CONTRAST  TECHNIQUE: Multidetector CT imaging of the abdomen and pelvis was performed using the standard protocol following bolus administration of intravenous contrast. Sagittal and coronal MPR images reconstructed from  axial data set.  CONTRAST:  80mL OMNIPAQUE IOHEXOL 300 MG/ML SOLN IV. Dilute oral contrast.  COMPARISON:  05/27/2013  FINDINGS: Chronic changes at lung base stable.  Scattered atherosclerotic calcifications aorta, distal aorta up to 2.5 x 2.7 cm.  Liver, gallbladder, spleen, pancreas, kidneys, and adrenal glands normal.  Probable artifact at LEFT adrenal gland on initial images, nonvisualized on delayed images.  Extensive diverticulosis of the descending and sigmoid colon with questionable pericolic inflammatory changes at the descending colon, cannot exclude diverticulitis.  No evidence of perforation or abscess.  Stomach and bowel loops otherwise normal for degree of opacification and distention.  Beam hardening artifacts from LEFT hip prosthesis obscure portions of pelvis.  Uterus surgically absent.  No mass, adenopathy, free fluid, or free air.  Osseous demineralization.  Compression deformities of multiple thoracolumbar vertebrae identified, progressive at L1 since prior exam.  IMPRESSION: Extensive colonic diverticulosis of the descending and sigmoid colon with  questionable subtle diverticulitis changes at the descending colon.  Suspected osteoporosis with multiple thoracolumbar compression deformities, progressive at L1 since prior exam.  Upper normal caliber distal abdominal aorta with atherosclerotic changes.   Electronically Signed   By: Ulyses Southward M.D.   On: 11/15/2014 15:34     EKG Interpretation   Date/Time:  Monday Nov 15 2014 11:57:55 EDT Ventricular Rate:  74 PR Interval:  151 QRS Duration: 100 QT Interval:  391 QTC Calculation: 434 R Axis:   -44 Text Interpretation:  Sinus rhythm Probable left atrial enlargement Left  ventricular hypertrophy No significant change since last tracing Confirmed  by Mirian Mo [phone]) on 11/15/2014 12:37:14 PM      MDM   Final diagnoses:  Diverticulitis of large intestine without perforation or abscess without bleeding   Patient presents for  fatigue, nausea, and intermittent LLQ abdominal pain that began last night.   Her labs are unremarkable and no UTI.  Her CT shows extensive colonic diverticulosis of the descending and sigmoid colon with questionable subtle diverticulitis in the descending colon. This is consistent with where her pain is located. She has had 2 episodes of diverticulitis in 2012 for which she was hospitalized for. Her vitals are not concerning.  She is afebrile and no elevated WBC. She is well appearing and her daughter is at bedside.  Both would like her to go home on antibiotics and she says she will stay with her and monitor her.  I have given her cipro and flagyl.  She can take tylenol for pain.  I gave her strict return precautions and follow up with her pcp.  They agree with the plan.   Catha Gosselin, PA-C 11/15/14 1627  Mirian Mo, MD 11/16/14 [phone]

## 2014-11-15 NOTE — ED Notes (Signed)
Pt back from CT

## 2014-11-15 NOTE — Discharge Instructions (Signed)
Diverticulitis Take medication as prescribed.  Diverticulitis is when small pockets that have formed in your colon (large intestine) become infected or swollen. HOME CARE  Follow your doctor's instructions.  Follow a special diet if told by your doctor.  When you feel better, your doctor may tell you to change your diet. You may be told to eat a lot of fiber. Fruits and vegetables are good sources of fiber. Fiber makes it easier to poop (have bowel movements).  Take supplements or probiotics as told by your doctor.  Only take medicines as told by your doctor.  Keep all follow-up visits with your doctor. GET HELP IF:  Your pain does not get better.  You have a hard time eating food.  You are not pooping like normal. GET HELP RIGHT AWAY IF:  Your pain gets worse.  Your problems do not get better.  Your problems suddenly get worse.  You have a fever.  You keep throwing up (vomiting).  You have bloody or black, tarry poop (stool). MAKE SURE YOU:   Understand these instructions.  Will watch your condition.  Will get help right away if you are not doing well or get worse. Document Released: 12/19/2007 Document Revised: 07/07/2013 Document Reviewed: 05/27/2013 [facility] Patient Information 2015 [place] [employer] [place]. This information is not intended to replace advice given to you by your health care provider. Make sure you discuss any questions you have with your health care provider.

## 2015-04-26 ENCOUNTER — Other Ambulatory Visit (HOSPITAL_BASED_OUTPATIENT_CLINIC_OR_DEPARTMENT_OTHER): Payer: Self-pay | Admitting: Family Medicine

## 2015-04-26 ENCOUNTER — Ambulatory Visit (HOSPITAL_BASED_OUTPATIENT_CLINIC_OR_DEPARTMENT_OTHER)
Admission: RE | Admit: 2015-04-26 | Discharge: 2015-04-26 | Disposition: A | Discharge status: Home / Self Care | Payer: Medicare Other | Source: Ambulatory Visit | Attending: Family Medicine | Admitting: Family Medicine

## 2015-04-26 DIAGNOSIS — M79604 Pain in right leg: Secondary | ICD-10-CM | POA: Insufficient documentation

## 2015-04-26 DIAGNOSIS — R600 Localized edema: Secondary | ICD-10-CM | POA: Diagnosis not present

## 2016-09-15 ENCOUNTER — Emergency Department (HOSPITAL_COMMUNITY): Payer: Medicare Other

## 2016-09-15 ENCOUNTER — Emergency Department (HOSPITAL_COMMUNITY)
Admission: EM | Admit: 2016-09-15 | Discharge: 2016-09-16 | Disposition: A | Discharge status: Home / Self Care | Payer: Medicare Other | Attending: Emergency Medicine | Admitting: Emergency Medicine

## 2016-09-15 ENCOUNTER — Encounter (HOSPITAL_COMMUNITY): Payer: Self-pay | Admitting: Emergency Medicine

## 2016-09-15 DIAGNOSIS — Z79899 Other long term (current) drug therapy: Secondary | ICD-10-CM | POA: Insufficient documentation

## 2016-09-15 DIAGNOSIS — J449 Chronic obstructive pulmonary disease, unspecified: Secondary | ICD-10-CM | POA: Insufficient documentation

## 2016-09-15 DIAGNOSIS — E039 Hypothyroidism, unspecified: Secondary | ICD-10-CM | POA: Insufficient documentation

## 2016-09-15 DIAGNOSIS — Z96642 Presence of left artificial hip joint: Secondary | ICD-10-CM | POA: Insufficient documentation

## 2016-09-15 DIAGNOSIS — I10 Essential (primary) hypertension: Secondary | ICD-10-CM | POA: Insufficient documentation

## 2016-09-15 DIAGNOSIS — R4182 Altered mental status, unspecified: Secondary | ICD-10-CM

## 2016-09-15 LAB — URINALYSIS, ROUTINE W REFLEX MICROSCOPIC
Bacteria, UA: NONE SEEN
Bilirubin Urine: NEGATIVE
Glucose, UA: NEGATIVE mg/dL
Hgb urine dipstick: NEGATIVE
Ketones, ur: 5 mg/dL — AB
Leukocytes, UA: NEGATIVE
Nitrite: NEGATIVE
Protein, ur: 30 mg/dL — AB
Specific Gravity, Urine: 1.016 (ref 1.005–1.030)
Squamous Epithelial / LPF: NONE SEEN
pH: 7 (ref 5.0–8.0)

## 2016-09-15 LAB — COMPREHENSIVE METABOLIC PANEL
ALBUMIN: 4.3 g/dL (ref 3.5–5.0)
ALK PHOS: 87 U/L (ref 38–126)
ALT: 15 U/L (ref 14–54)
ANION GAP: 8 (ref 5–15)
AST: 20 U/L (ref 15–41)
BUN: 23 mg/dL — ABNORMAL HIGH (ref 6–20)
CALCIUM: 9 mg/dL (ref 8.9–10.3)
CO2: 27 mmol/L (ref 22–32)
Chloride: 101 mmol/L (ref 101–111)
Creatinine, Ser: 0.59 mg/dL (ref 0.44–1.00)
GFR calc Af Amer: >60 mL/min (ref >60)
GFR calc non Af Amer: >60 mL/min (ref >60)
GLUCOSE: 150 mg/dL — AB (ref 65–99)
Potassium: 4.1 mmol/L (ref 3.5–5.1)
Sodium: 136 mmol/L (ref 135–145)
Total Bilirubin: 0.6 mg/dL (ref 0.3–1.2)
Total Protein: 7.6 g/dL (ref 6.5–8.1)

## 2016-09-15 LAB — CBC WITH DIFFERENTIAL/PLATELET
Basophils Absolute: 0 10*3/uL (ref 0.0–0.1)
Basophils Relative: 0 %
Eosinophils Absolute: 0.1 10*3/uL (ref 0.0–0.7)
Eosinophils Relative: 0 %
HEMATOCRIT: 41.6 % (ref 36.0–46.0)
Hemoglobin: 13.8 g/dL (ref 12.0–15.0)
LYMPHS PCT: 15 %
Lymphs Abs: 2.1 10*3/uL (ref 0.7–4.0)
MCH: 30.9 pg (ref 26.0–34.0)
MCHC: 33.2 g/dL (ref 30.0–36.0)
MCV: 93.1 fL (ref 78.0–100.0)
MONO ABS: 1 10*3/uL (ref 0.1–1.0)
Monocytes Relative: 7 %
NEUTROS ABS: 10.8 10*3/uL — AB (ref 1.7–7.7)
Neutrophils Relative %: 78 %
Platelets: UNDETERMINED 10*3/uL (ref 150–400)
RBC: 4.47 MIL/uL (ref 3.87–5.11)
RDW: 13.5 % (ref 11.5–15.5)
WBC: 13.9 10*3/uL — ABNORMAL HIGH (ref 4.0–10.5)

## 2016-09-15 NOTE — ED Notes (Signed)
Bed: [id number] Expected date:  Expected time:  Means of arrival:  Comments: 83 yo f AMS

## 2016-09-15 NOTE — ED Triage Notes (Signed)
Per EMS , pt. From home reported to have altered mental status started this morning ,per family pt. Is possible for UTI. Pt. Has dementia ,denied any pain , reported to have 4 doses of tylenol at home today, no dose given. No SOB noted. Pt. Has DNR form.

## 2016-09-15 NOTE — ED Provider Notes (Signed)
[facility]-EMERGENCY DEPT Provider Note   CSN: [id number] Arrival date & time: 09/15/16  2049 By signing my name below, I, Levon Hedger, attest that this documentation has been prepared under the direction and in the presence of non-physician practitioner, Buel Ream PA-C. Electronically Signed: Levon Hedger, Scribe. 09/15/2016. 9:32 PM.   History   Chief Complaint Chief Complaint  Patient presents with  . Altered Mental Status  . possible UTI   LEVEL 5 CAVEAT DUE TO AMS   HPI Mikayla Villanueva is a 83 y.o. female with a history of arthritis, diverticulitis, HLD, HTN, COPD and dementia who presents to the Emergency Department with a chief complaint of progressively worsening altered metal status onset 24 hours ago. Per pt's daughter, starting last night, pt was complaining of low back pain. Daughter also reports foul smell to her urine and states pt has been having increased urinary frequency. She is also coughing more in the past 2-3 months. Pt complains of generalized myalgias, but denies any fall, dysuria, increased urinary frequency, CP, SOB, abdominal pain, nausea, vomiting, back pain, neck pain, or any other associated symptoms.   The history is provided by medical records, the patient and a relative (daughter). The history is limited by the condition of the patient. No language interpreter was used.   Past Medical History:  Diagnosis Date  . Arthritis   . Diverticulitis   . Hypercholesteremia   . Hyperlipemia   . Hypertension   . Hypothyroidism   . Osteoporosis   . Thyroid disease     Patient Active Problem List   Diagnosis Date Noted  . Compression fracture 01/14/2014  . Cellulitis 01/14/2014  . Unspecified hypothyroidism 06/10/2013  . MRSA nasal colonization 06/08/2013  . Unspecified hereditary and idiopathic peripheral neuropathy 06/08/2013  . Closed left hip fracture (HCC) 06/05/2013  . Hypertension 06/05/2013  . COPD (chronic obstructive pulmonary disease) (HCC) 06/05/2013   . UTI (lower urinary tract infection) 06/05/2013  . Diverticulitis of colon with perforation, percutaneous drainage 12/11/2011    Past Surgical History:  Procedure Laterality Date  . ABDOMINAL HYSTERECTOMY    . ANKLE SURGERY    . BACK SURGERY    . broken wrist Left    left, Dr. Cleophas Dunker,  . EYE SURGERY    . HIP ARTHROPLASTY Left 06/07/2013   Procedure: ARTHROPLASTY BIPOLAR HIP;  Surgeon: Loanne Drilling, MD;  Location: [facility];  Service: Orthopedics;  Laterality: Left;   OB History    No data available      Home Medications    Prior to Admission medications   Medication Sig Start Date End Date Taking? Authorizing Provider  acetaminophen (TYLENOL) 500 MG tablet Take 2 tablets (1,000 mg total) by mouth 3 (three) times daily. 01/16/14   Jeralyn Bennett, MD  cefUROXime (CEFTIN) 250 MG tablet Take 1 tablet (250 mg total) by mouth 2 (two) times daily with a meal. Patient not taking: Reported on 11/15/2014 01/16/14   Jeralyn Bennett, MD  ciprofloxacin (CIPRO) 500 MG tablet Take 1 tablet (500 mg total) by mouth every 12 (twelve) hours. 11/15/14   Hanna Patel-Mills, PA-C  clobetasol ointment (TEMOVATE) 0.05 % Apply 1 application topically 2 (two) times daily as needed. rash 11/08/14   Historical Provider, MD  ibuprofen (ADVIL,MOTRIN) 600 MG tablet Take 1 tablet (600 mg total) by mouth every 8 (eight) hours as needed for moderate pain. 01/16/14   Jeralyn Bennett, MD  levothyroxine (SYNTHROID, LEVOTHROID) 88 MCG tablet Take 88 mcg by mouth daily.  Historical Provider, MD  lisinopril (PRINIVIL,ZESTRIL) 20 MG tablet Take 20 mg by mouth daily.    Historical Provider, MD  metoprolol succinate (TOPROL-XL) 50 MG 24 hr tablet Take 50 mg by mouth daily. Take with or immediately following a meal.    Historical Provider, MD  metroNIDAZOLE (FLAGYL) 500 MG tablet Take 1 tablet (500 mg total) by mouth 2 (two) times daily. 11/15/14   Hanna Patel-Mills, PA-C  simvastatin (ZOCOR) 40 MG tablet Take 40 mg by mouth  every evening.    Historical Provider, MD  tiotropium (SPIRIVA) 18 MCG inhalation capsule Place 18 mcg into inhaler and inhale daily.    Historical Provider, MD    Family History Family History  Problem Relation Age of Onset  . Heart disease Mother   . Stroke Father     Social History Social History  Substance Use Topics  . Smoking status: Never Smoker  . Smokeless tobacco: Never Used  . Alcohol use No    Allergies   Benadryl [diphenhydramine]; Codeine; Morphine and related; Sulfa antibiotics; Tramadol; and Nitrofurantoin monohyd macro  Review of Systems Review of Systems  Unable to perform ROS: Mental status change   Physical Exam Updated Vital Signs BP 154/81   Pulse 88   Temp 97.9 F (36.6 C) (Oral)   Resp 24   Ht  (1.6 m)   Wt 56.7 kg   SpO2 96%   BMI 22.14 kg/m   Physical Exam  Constitutional: She appears well-developed and well-nourished. No distress.  HENT:  Head: Normocephalic and atraumatic.  Mouth/Throat: Oropharynx is clear and moist. No oropharyngeal exudate.  Eyes: Conjunctivae are normal. Pupils are equal, round, and reactive to light. Right eye exhibits no discharge. Left eye exhibits no discharge. No scleral icterus.  Neck: Normal range of motion. Neck supple. No thyromegaly present.  Cardiovascular: Normal rate, regular rhythm, normal heart sounds and intact distal pulses.  Exam reveals no gallop and no friction rub.   No murmur heard. Pulmonary/Chest: Effort normal and breath sounds normal. No stridor. No respiratory distress. She has no wheezes. She has no rales.  Abdominal: Soft. Bowel sounds are normal. She exhibits no distension. There is tenderness in the suprapubic area. There is no rebound and no guarding.  Musculoskeletal: She exhibits no edema.  Lymphadenopathy:    She has no cervical adenopathy.  Neurological: She is alert. Coordination normal.  Skin: Skin is warm and dry. No rash noted. She is not diaphoretic. No pallor.    Psychiatric: She has a normal mood and affect.  Nursing note and vitals reviewed.  ED Treatments / Results  DIAGNOSTIC STUDIES:  Oxygen Saturation is 97% on RA, normal by my interpretation.    COORDINATION OF CARE:  9:58 PM Discussed treatment plan with pt and daughter at bedside and pt agreed to plan.   Labs (all labs ordered are listed, but only abnormal results are displayed) Labs Reviewed  CBC WITH DIFFERENTIAL/PLATELET - Abnormal; Notable for the following:       Result Value   WBC 13.9 (*)    Neutro Abs 10.8 (*)    All other components within normal limits  URINALYSIS, ROUTINE W REFLEX MICROSCOPIC - Abnormal; Notable for the following:    Ketones, ur 5 (*)    Protein, ur 30 (*)    All other components within normal limits  COMPREHENSIVE METABOLIC PANEL - Abnormal; Notable for the following:    Glucose, Bld 150 (*)    BUN 23 (*)    All  other components within normal limits    EKG  EKG Interpretation None       Radiology Dg Chest 2 View  Result Date: 09/15/2016 CLINICAL DATA:  Altered mental status. EXAM: CHEST  2 VIEW COMPARISON:  Chest radiograph 06/05/2013, thoracic spine radiographs 01/14/2014 FINDINGS: Cardiomegaly is again seen. Tortuous atherosclerotic thoracic aorta, stable. Interstitial and bronchial thickening as diminished from prior exam. Lungs are hyperinflated. No focal airspace disease. No pleural fluid or pneumothorax. There are compression fractures in the mid lower thoracic spine which are grossly stable. Diffuse bony under mineralization. IMPRESSION: 1. Stable cardiomegaly and aortic tortuosity. Thoracic aortic atherosclerosis. 2. Hyperinflation and chronic interstitial opacities. Electronically Signed   By: Rubye Oaks M.D.   On: 09/15/2016 22:21    Procedures Procedures (including critical care time)  Medications Ordered in ED Medications - No data to display   Initial Impression / Assessment and Plan / ED Course  I have reviewed the  triage vital signs and the nursing notes.  Pertinent labs & imaging results that were available during my care of the patient were reviewed by me and considered in my medical decision making (see chart for details).     CBC shows WBC 13.9. CMP shows glucose 150, BUN 33. UA shows 5 ketones, 30 protein. CXR shows stable cardiomegaly and aortic tortuosity; thoracic aortic atherosclerosis; hyperinflation and chronic interstitial opacities. Patient is back to baseline per daughter. Daughter agreeable for discharge home. Follow-up to PCP in 2 days. Strict return precautions given. Patient also evaluated by Dr. Denton Lank who guided the patient's management and agrees with plan. Patient vitals stable throughout ED course and discharged in satisfactory condition.  Final Clinical Impressions(s) / ED Diagnoses   Final diagnoses:  Altered mental status, unspecified altered mental status type    New Prescriptions New Prescriptions   No medications on file  I personally performed the services described in this documentation, which was scribed in my presence. The recorded information has been reviewed and is accurate.    Emi Holes, PA-C 09/15/16 1610    Cathren Laine, MD 09/16/16 [phone]

## 2016-09-15 NOTE — Discharge Instructions (Signed)
Please follow-up with primary care provider on Monday for recheck of symptoms. Please return to the emergency department or call your primary care provider if you develop any new or worsening symptoms.

## 2016-09-16 NOTE — ED Notes (Signed)
PTR arrived to pick up patient.

## 2016-09-16 NOTE — ED Notes (Signed)
Patient discharged waiting on PTAR.

## 2016-10-30 ENCOUNTER — Ambulatory Visit (INDEPENDENT_AMBULATORY_CARE_PROVIDER_SITE_OTHER): Payer: Medicare Other

## 2016-10-30 ENCOUNTER — Ambulatory Visit (INDEPENDENT_AMBULATORY_CARE_PROVIDER_SITE_OTHER): Payer: Medicare Other | Admitting: Orthopaedic Surgery

## 2016-10-30 ENCOUNTER — Encounter (INDEPENDENT_AMBULATORY_CARE_PROVIDER_SITE_OTHER): Payer: Self-pay | Admitting: Orthopaedic Surgery

## 2016-10-30 VITALS — Ht 64.0 in | Wt 125.0 lb

## 2016-10-30 DIAGNOSIS — M79642 Pain in left hand: Secondary | ICD-10-CM

## 2016-10-30 NOTE — Progress Notes (Signed)
Office Visit Note   Patient: Mikayla Villanueva           Date of Birth: [birth year]           MRN: [id number] Visit Date: 10/30/2016              Requested by: Joycelyn Rua, MD [address] [place], [place] [address] PCP: Joycelyn Rua, MD   Assessment & Plan: Visit Diagnoses:  1. Pain in left hand   2. Pain of left hand   Possible occult fracture of either left distal radius or ulna. We will apply a volar wrist splint and have her return in 10-14 days to repeat films   Follow-Up Instructions: Return in about 2 weeks (around 11/13/2016).   Orders:  Orders Placed This Encounter  Procedures  . XR Hand Complete Left   No orders of the defined types were placed in this encounter.     Procedures: No procedures performed   Clinical Data: No additional findings.   Subjective: Chief Complaint  Patient presents with  . Left Wrist - Injury, Pain, Fall    Pt fell last night while trying to go to BR. Pt complaining of pain,  ecchymosis and edema.  SHe has a hx of broken this arm at least 10 years ago.  [name] is accompanied by her daughter. Apparently I had treated her 15-20 years ago for a distal radius and ulna fracture. She fell last night attempting to go to the bathroom" on her own ". Today she has moderate swelling in the distal third of the left forearm extending her fingers associated with pain  HPI  Review of Systems   Objective: Vital Signs: Ht  (1.626 m)   Wt 125 lb (56.7 kg)   BMI 21.46 kg/m   Physical Exam  Ortho Exam moderate swelling of the distal third of her left forearm extending into the proximal aspect of the wrist. Early ecchymosis. Intact. Hematoma at the volar tip of the long finger that's not painful and not draining. Full range of motion of the long finger. With range of motion of her wrist localizing to the distal radius and the ulnar without crepitation. No obvious deformity. Sensory exam intact.  Specialty Comments:  No specialty  comments available.  Imaging: Xr Hand Complete Left  Result Date: 10/30/2016 Films of the left hand and wrist were obtained in several projections. Evidence of an old transverse distal radius and ulna fracture are apparent in good position. I do not see an obvious new fracture but clinical findings would suggest that there may be an occult fracture of the distal radius and/or ulnar. Carpus appears to be intact.    PMFS History: Patient Active Problem List   Diagnosis Date Noted  . Compression fracture 01/14/2014  . Cellulitis 01/14/2014  . Unspecified hypothyroidism 06/10/2013  . MRSA nasal colonization 06/08/2013  . Unspecified hereditary and idiopathic peripheral neuropathy 06/08/2013  . Closed left hip fracture (HCC) 06/05/2013  . Hypertension 06/05/2013  . COPD (chronic obstructive pulmonary disease) (HCC) 06/05/2013  . UTI (lower urinary tract infection) 06/05/2013  . Diverticulitis of colon with perforation, percutaneous drainage 12/11/2011   Past Medical History:  Diagnosis Date  . Arthritis   . Diverticulitis   . Hypercholesteremia   . Hyperlipemia   . Hypertension   . Hypothyroidism   . Osteoporosis   . Thyroid disease     Family History  Problem Relation Age of Onset  . Heart disease Mother   .  Stroke Father     Past Surgical History:  Procedure Laterality Date  . ABDOMINAL HYSTERECTOMY    . ANKLE SURGERY    . BACK SURGERY    . broken wrist Left    left, Dr. Cleophas Dunker,  . EYE SURGERY    . HIP ARTHROPLASTY Left 06/07/2013   Procedure: ARTHROPLASTY BIPOLAR HIP;  Surgeon: Loanne Drilling, MD;  Location: [facility];  Service: Orthopedics;  Laterality: Left;   Social History   Occupational History  . Not on file.   Social History Main Topics  . Smoking status: Never Smoker  . Smokeless tobacco: Never Used  . Alcohol use No  . Drug use: No  . Sexual activity: No

## 2016-11-09 ENCOUNTER — Ambulatory Visit (INDEPENDENT_AMBULATORY_CARE_PROVIDER_SITE_OTHER): Payer: BLUE CROSS/BLUE SHIELD | Admitting: Orthopaedic Surgery

## 2016-11-09 ENCOUNTER — Ambulatory Visit (INDEPENDENT_AMBULATORY_CARE_PROVIDER_SITE_OTHER): Payer: Medicare Other | Admitting: Orthopaedic Surgery

## 2016-11-12 ENCOUNTER — Ambulatory Visit (INDEPENDENT_AMBULATORY_CARE_PROVIDER_SITE_OTHER): Payer: Medicare Other

## 2016-11-12 ENCOUNTER — Ambulatory Visit (INDEPENDENT_AMBULATORY_CARE_PROVIDER_SITE_OTHER): Payer: Medicare Other | Admitting: Orthopaedic Surgery

## 2016-11-12 DIAGNOSIS — M79642 Pain in left hand: Secondary | ICD-10-CM

## 2016-11-12 DIAGNOSIS — M25532 Pain in left wrist: Secondary | ICD-10-CM | POA: Diagnosis not present

## 2016-11-12 NOTE — Progress Notes (Signed)
Office Visit Note   Patient: Mikayla Villanueva           Date of Birth: 1920-09-15           MRN: [id number] Visit Date: 11/12/2016              Requested by: Joycelyn Rua, MD [address] [place], [place] [address] PCP: Joycelyn Rua, MD   Assessment & Plan: Visit Diagnoses:  1. Pain in left hand   2. Pain in left wrist   Possible occult fracture of distal radius still not obviously visible by film  Plan: Splint immobilization for at least 2 more weeks. We'll follow up only if she continues to have a problem.  Follow-Up Instructions: No Follow-up on file.   Orders:  Orders Placed This Encounter  Procedures  . XR Hand Complete Left   No orders of the defined types were placed in this encounter.     Procedures: No procedures performed   Clinical Data: No additional findings.   Subjective: Chief Complaint  Patient presents with  . Left Wrist - Follow-up, Injury    Mikayla Villanueva is a 83 y o that is here for a F/U of left wrist injury. Pt's daughter states she took wrist splint off and has not worn it. Swelling and pain  Mikayla Villanueva is again accompanied by her daughter for follow-up evaluation of her left wrist pain. She is 2 weeks post injury with initial films a week ago negative for obvious fracture. Much of the swelling and ecchymosis has resolved. Mikayla Villanueva refuses to wear the splint. She does have an issue with weakness and "weak bones" and ambulates with a walker. She also has a intention tremor which limits her ability to ambulate. She's had some difficulty using a walker with her wrist pain.  HPI  Review of Systems   Objective: Vital Signs: There were no vitals taken for this visit.  Physical Exam  Ortho Exam diffuse ecchymosis has resolved about the left forearm hand and wrist. Still a little bit of swelling at the distal radius and ulna was some mild discomfort. Right radial deviation of her wrist but is had a prior distal radius and ulna fracture. Able to  make a full fist.  Specialty Comments:  No specialty comments available.  Imaging: Xr Hand Complete Left  Result Date: 11/12/2016 Repeat films of the left wrist were obtained in several projections. I'm not sure I see evidence of an acute fracture. There is a prior fracture of the distal radius and ulnar that appears to have healed with abundant callus there is diffuse bony demineralization. Arthritis at the basal thumb joint is also noted.    PMFS History: Patient Active Problem List   Diagnosis Date Noted  . Compression fracture 01/14/2014  . Cellulitis 01/14/2014  . Unspecified hypothyroidism 06/10/2013  . MRSA nasal colonization 06/08/2013  . Unspecified hereditary and idiopathic peripheral neuropathy 06/08/2013  . Closed left hip fracture (HCC) 06/05/2013  . Hypertension 06/05/2013  . COPD (chronic obstructive pulmonary disease) (HCC) 06/05/2013  . UTI (lower urinary tract infection) 06/05/2013  . Diverticulitis of colon with perforation, percutaneous drainage 12/11/2011   Past Medical History:  Diagnosis Date  . Arthritis   . Diverticulitis   . Hypercholesteremia   . Hyperlipemia   . Hypertension   . Hypothyroidism   . Osteoporosis   . Thyroid disease     Family History  Problem Relation Age of Onset  . Heart disease Mother   .  Stroke Father     Past Surgical History:  Procedure Laterality Date  . ABDOMINAL HYSTERECTOMY    . ANKLE SURGERY    . BACK SURGERY    . broken wrist Left    left, Dr. Cleophas Dunker,  . EYE SURGERY    . HIP ARTHROPLASTY Left 06/07/2013   Procedure: ARTHROPLASTY BIPOLAR HIP;  Surgeon: Loanne Drilling, MD;  Location: [facility];  Service: Orthopedics;  Laterality: Left;   Social History   Occupational History  . Not on file.   Social History Main Topics  . Smoking status: Never Smoker  . Smokeless tobacco: Never Used  . Alcohol use No  . Drug use: No  . Sexual activity: No     Valeria Batman, MD   Note - This record has been  created using [employer].  Chart creation errors have been sought, but may not always  have been located. Such creation errors do not reflect on  the standard of medical care.

## 2017-11-04 ENCOUNTER — Other Ambulatory Visit: Payer: Self-pay

## 2017-11-04 NOTE — Patient Outreach (Addendum)
[facility]) Care Management  11/04/2017  Lucendia HerrlichFaye Villanueva 05/14/1921 [id number]   Telephone Screen  Referral Date: 11/04/17 Referral Source: MD office(Dr. Lenise ArenaMeyers) Referral Reason:"COPD,DM, HTN, assistance with nursing home placement" Insurance: Medicare   Outreach attempt # 1 to patient. Spoke with dtr(ROI on file). Screening completed.    Social: Dtr voices that she has been the sole caregiver for patient of the past six years. She voices that this is becoming too much for her to handle and she needs assistance with possibly getting patient placed. Dtr voices that patient requires assistance with all ADLs/IADLs. She denies any recent falls. DME in the home include  Walker, wheelchair and shower chair(pt refuses to use per dtr). Dtr is taking patient to all MD appts. Dtr reports that she always promised the patient that she would never put her in a home but however she does not feel like she has a choice at this time. Dtr states she is interested in [facility] as she is familiar with the facility and staff.    Conditions: Per chart review, patient has PMH of COPD, DM, HTN, dementia with hallucinations, thyroid disease, HLD, osteoporosis, macular degeneration and diverticulitis. Dtr states that patient also has "large skin cancer area near right eye" and reports she is afraid that "patient is going blind." Dtr voices ongoing issues with fluid retention and edema to bilateral LEs. She stets that due to circulation and all her medical issues patient's legs "are swollen three times their normal sizes." She states that patient has had issues with weeping legs in the past and is fearful that the patient's "legs are going to burst." She is currently not on any treatment or therapy to manage this issue. Dtr reports that patient is a diabetic but has never taken any medication for it. Last A1C was 6.4 (Oct 2018). Dtr reports that she is only checking cbgs once a month. Dtr very concerned  about patient's worsening dementia with hallucinations. She states that "hallucinations are escalating" and that patient is "up all night long" which is exhausting and wearing her out. Dtr also stets that her mother "sees people all the time." Patient is currently not on any medication to help manage dementia and/or hallucinations.   Medications: Dtr voices that patient is taking about six meds. She denies any issues managing and affording meds at this time. Dtr reports that she is "disguising meds in applesauce" in order to get patient to take meds.    Appointments: Per dtr patient is only currently being followed by PCP.   Advance Directives: Dtr voices that they have DNR in the home.   Consent: [facility] reviewed and discussed with dtr. Verbal consent for services given.   Plan: RN [facility] for [facility] with placement. RN CM will [facility] for further in home eval/assessment of care needs and assistance with managing chronic conditions.    Antionette Fairyoshanda Woodfin Kiss, RN,BSN,CCM [facility] Care Management Telephonic Care Management Coordinator Direct Phone: [phone] Toll Free: [phone] Fax: [phone]

## 2017-11-07 ENCOUNTER — Other Ambulatory Visit: Payer: Self-pay | Admitting: Licensed Clinical Social Worker

## 2017-11-07 ENCOUNTER — Encounter: Payer: Self-pay | Admitting: Licensed Clinical Social Worker

## 2017-11-07 NOTE — Patient Outreach (Signed)
Assessment:  CSW received referral on Norva Scholle. CSW completed chart review on client on 11/07/17.  Client has support from her daughter, Nigel MormonBarbara Marshall. [name] is caregiver for client.  Client sees Dr. Joycelyn RuaStephen Meyers as primary care doctor.  Client has Dementia diagnosis.  CSW called client home phone number on 11/07/17 and spoke via phone with Nigel MormonBarbara Marshall. CSW verified identity of Nigel MormonBarbara Marshall. CSW received verbal permission from Nigel MormonBarbara Marshall on 11/07/17 for CSW to speak with [name] about client needs and status. [name] said that client needs assistance with all activities of daily living. [name] is main caregiver for client.. CSW spoke with [name] about [facility] in nursing, social work and pharmacy.  CSW and [name] spoke of client needs. [name] said she is having challenges in caring for client and meeting client needs.  CSW spoke with [name] about client care plan. CSW encouraged that [name] or client communicate with CSW in next 30 days to discuss level of care options for client. [name] said she is familiar with [facility] [place], [place].  CSW talked with [name] about her requesting FL-2 completion for client by client's primary care doctor; when completed FL-2 could be faxed to that care facility to see if facility has any available bed openings at that time. CSW informed [name] that RNCM Irving ShowsJulie Farmer would also be in contact with [name] to discuss nursing needs of client.  [name] said she would like to talk with RN Irving ShowsJulie Farmer about swelling issues of client's legs. [name] said it is hard to get client to and from client's medical appointments. CSW gave [name] [facility] phone number and encouraged [name] to call CSW as needed to discuss social work needs of client. [name] was appreciative of phone call from CSW on 11/07/17.   Plan:  Nigel MormonBarbara Marshall or client to communicate with CSW in next 30 days to discuss level of care  options for client in the  community.   CSW to collaborate with RN Irving ShowsJulie Farmer in monitoring needs of client.   CSW to call client or Nigel MormonBarbara Marshall in 3 weeks to assess client needs at that time.   Kelton PillarMichael S.Beulah Matusek MSW, LCSW Licensed Clinical Social Worker [facility] Care Management [phone]

## 2017-11-08 ENCOUNTER — Other Ambulatory Visit: Payer: Self-pay | Admitting: *Deleted

## 2017-11-08 NOTE — Patient Outreach (Signed)
Telephone call to patient's daughter [name] to schedule initial home visit, HIPAA verified, initial home visit scheduled for week of 11/18/17.  Irving ShowsJulie Lenell Mcconnell [facility], BSN [facility] Care Coordinator [phone]

## 2017-11-18 ENCOUNTER — Encounter: Payer: Self-pay | Admitting: *Deleted

## 2017-11-18 ENCOUNTER — Other Ambulatory Visit: Payer: Self-pay | Admitting: *Deleted

## 2017-11-18 NOTE — Patient Outreach (Addendum)
[facility]) Care Management   11/18/2017  Mikayla Villanueva [birth year] [id number]  Mikayla Villanueva is an 84 y.o. female  Subjective: Initial home visit with pt, daughter Mikayla Villanueva present, HIPAA verified, Mikayla Villanueva states she is the sole caregiver for pt and has no other assistance except for occasional help from her daughter.  Mikayla Villanueva states pt is "set in her ways, does not want to change anything"   Has hallucinations which are getting worse, stays awake all night and sleeps during the day.  Has chronic LE edema and redness and pt "takes any compression wraps off" also has "areas on her bottom"    Objective:   Vitals:   11/18/17 1202 11/18/17 1224  BP:  124/62  Pulse: 64 64  Resp:  18  SpO2: 98% 98%  Weight:  120 lb (54.4 kg)  Height:  1.626 m ( )   ROS  Physical Exam  Constitutional: She appears well-developed and well-nourished.  HENT:  Head: Normocephalic.  Neck: Normal range of motion.  Cardiovascular: Normal rate.  Irregular Rate  Respiratory: Effort normal and breath sounds normal.  GI: Soft. Bowel sounds are normal.  Musculoskeletal: She exhibits edema.  4+ Edema to Left Leg from the knee down. 3+ Edema to Right leg from knee down Redness noted to both LE BL  Neurological: She is alert.  Patient alert to person and place  Skin: Skin is warm and dry.  2 open areas left and right buttocks, approximately 1 cm round, superficial, pink wound bed, no drainage.  Psychiatric: She has a normal mood and affect. Her behavior is normal.  Pt has dementia which is worsening per daughter    Encounter Medications:   Outpatient Encounter Medications as of 11/18/2017  Medication Sig Note  . acetaminophen (TYLENOL) 500 MG tablet Take 2 tablets (1,000 mg total) by mouth 3 (three) times daily.   . cefUROXime (CEFTIN) 250 MG tablet Take 1 tablet (250 mg total) by mouth 2 (two) times daily with a meal. (Patient not taking: Reported on 11/15/2014)   . ciprofloxacin (CIPRO) 500 MG  tablet Take 1 tablet (500 mg total) by mouth every 12 (twelve) hours. (Patient not taking: Reported on 10/30/2016)   . clobetasol ointment (TEMOVATE) 0.05 % Apply 1 application topically 2 (two) times daily as needed. rash 11/15/2014: Received from: External Pharmacy Received Sig:   . ibuprofen (ADVIL,MOTRIN) 600 MG tablet Take 1 tablet (600 mg total) by mouth every 8 (eight) hours as needed for moderate pain.   Marland Kitchen levothyroxine (SYNTHROID, LEVOTHROID) 88 MCG tablet Take 88 mcg by mouth daily.    Marland Kitchen lisinopril (PRINIVIL,ZESTRIL) 20 MG tablet Take 20 mg by mouth daily.   . metoprolol succinate (TOPROL-XL) 50 MG 24 hr tablet Take 50 mg by mouth daily. Take with or immediately following a meal.   . metroNIDAZOLE (FLAGYL) 500 MG tablet Take 1 tablet (500 mg total) by mouth 2 (two) times daily. (Patient not taking: Reported on 10/30/2016)   . PROAIR HFA 108 (90 Base) MCG/ACT inhaler INHALE TWO PUFFS EVERY 4 TO 6 HOURS AS NEEDED   . simvastatin (ZOCOR) 40 MG tablet Take 40 mg by mouth every evening.   . tiotropium (SPIRIVA) 18 MCG inhalation capsule Place 18 mcg into inhaler and inhale daily.    No facility-administered encounter medications on file as of 11/18/2017.     Functional Status:   In your present state of health, do you have any difficulty performing the following activities: 11/18/2017 11/07/2017  Hearing? N N  Vision? - Y  Difficulty concentrating or making decisions? Malvin Johns  Walking or climbing stairs? Y Y  Dressing or bathing? Y Y  Doing errands, shopping? Malvin Johns  Preparing Food and eating ? Y Y  Using the Toilet? Y Y  In the past six months, have you accidently leaked urine? Y N  Do you have problems with loss of bowel control? N N  Managing your Medications? Y Y  Managing your Finances? Malvin Johns  Housekeeping or managing your Housekeeping? Y Y  Some recent data might be hidden    Fall/Depression Screening:    Fall Risk  11/18/2017 11/07/2017 11/04/2017  Falls in the past year? No No No  Risk for  fall due to : Impaired balance/gait;Impaired mobility Impaired mobility;Impaired vision -   PHQ 2/9 Scores 11/18/2017 11/07/2017 11/04/2017  PHQ - 2 Score 0 2 -  PHQ- 9 Score - 7 -  Exception Documentation - - Other- indicate reason in comment box  Not completed - - call completed with dtr due to patient's dementia    Assessment:  Pt will not wear compression wraps or keep hydrocolloid on her buttocks and verbalizes this to RN CM, pt/ daughter not willing to make changes such as the sale of the home, daughter rarely takes pt out of the house, is willing to try home health again and verbalizes understanding pt needs to see primary care MD for assessment of legs, buttocks, etc.   Pt does not have hospital bed and does not want one, daughter willing to try gel cushion.  RN CM talked with daughter about podiatry visit and will see primary MD first to see if MD will trim toenails. RN CM faxed initial home visit and barrier letter to primary care MD Dr. Lenise Arena, reported broken skin integrity, edema, request for home health, gel cushion and that daughter will bring pt in to see MD.  [facility] CM Care Plan Problem One     Most Recent Value  Care Plan Problem One  Knowledge deficit related to dementia  Role Documenting the Problem One  Care Management Coordinator  Care Plan for Problem One  Active  [facility] Long Term Goal   Caregiver will be able to adequately care for pt in the home with available resources or consider another level of care within 60 days  [facility] Long Term Goal Start Date  11/18/17  Interventions for Problem One Long Term Goal  RN CM gave [facility] calendar and reviewed resources, gave phone number to [facility] in [place] and [facility] center, gave EMMI handouts related to dementia  [facility] CM Short Term Goal #1   Daughter will make appointment within 7 days for pt to see primary care MD  [facility] CM Short Term Goal #1 Start Date  11/18/17  Interventions for Short Term Goal #1  RN CM ask patient's daughter Mikayla Villanueva to  schedule appointment with primary care MD for face to face needed for home health and for assessment of legs, toenails and treatment.  [facility] CM Short Term Goal #2   Pt will have home health services in place within 30 days  [facility] CM Short Term Goal #2 Start Date  11/18/17  Interventions for Short Term Goal #2  RN CM faxed barrier letter to primary care MD, requested home health services for wound care, reported 2 open areas on buttocks, edema in LE BL, claw like toenails that need treatment , reported pt needs an order for gel cushion (pt has wheelchair)  [facility] CM Care Plan Problem Two     Most Recent Value  Care Plan Problem Two  Broken skin integrity  Role Documenting the Problem Two  Care Management Coordinator  Care Plan for Problem Two  Active  [facility] CM Short Term Goal #1   Pt will show improvement with healing to skin breakdown on buttocks and improved control of edema to LE BL within 30 days  [facility] CM Short Term Goal #1 Start Date  11/18/17  Interventions for Short Term Goal #2   RN CM talked with daughter about home health services to assist with wound care, requested home health MD order, ask that pt change positions q 2 hours, elevate legs throughout the day, walk throughout the house several times during the day, make sure pt has adequate protein in diet.      Plan: see pt for home visit next month Call daughter for follow up next week  Irving Shows [facility], BSN [facility] Care Coordinator [phone]

## 2017-11-25 ENCOUNTER — Other Ambulatory Visit: Payer: Self-pay | Admitting: *Deleted

## 2017-11-25 NOTE — Patient Outreach (Signed)
Telephone call to patient's daughter Britta Mccreedy for follow up, per Britta Mccreedy, pt is to see primary care MD tomorrow 11/26/17, RN CM reminded Britta Mccreedy to discuss home health services, prescription for gel cushion and discuss getting patient's toenails trimmed,  RN CM had previously faxed note informing primary MD and to discuss at patient's appointment.  PLAN See pt for home visit next month Collaborate with CSW as needed  Irving Shows [facility], BSN [facility] Care Coordinator [phone]

## 2017-11-29 ENCOUNTER — Other Ambulatory Visit: Payer: Self-pay | Admitting: Licensed Clinical Social Worker

## 2017-11-29 NOTE — Patient Outreach (Signed)
Assessment:  CSW spoke via phone with Mikayla Villanueva. CSW verified identity of Mikayla Villanueva. . CSW received verbal permission from [place] on 11/29/17 for CSW to speak with Mikayla Villanueva about client needs.CSW spoke with Mikayla Villanueva about client care plan. CSW encouraged that Mikayla Villanueva or client communicate with CSW in next 30 days to discuss level of care options for client. CSW and Mikayla Villanueva had spoken previously about FL-2 completion by client's primary care doctor. Client had an appointment with her primary care doctor on 11/26/17. Mikayla Villanueva said that Mikayla Villanueva had prescribed an antibiotic for client. She said Mikayla. Mikayla Villanueva had ordered a gel cushion for client. She said Mikayla. Mikayla Villanueva had ordered Home Health Nursing support for client. Mikayla Villanueva said that client is to receive home health nursing support through [facility]. Mikayla Villanueva said Mikayla. Mikayla Villanueva wanted to try this approach at present to see how client did with home health nursing support. If level of care change was needed in the future, Mikayla. Mikayla Villanueva could then assist, as needed with FL-2 completion.  CSW thanked Mikayla Villanueva for phone call with CSW on 11/29/17. Mikayla Villanueva was appreciative of phone call with CSW on 11/29/17.    Plan:  Mikayla Villanueva or client to communicate with CSW in next 30 days to discuss level of care  options for client in the community.   CSW to collaborate with RN Irving Shows, as needed, in monitoring needs of client.  CSW to call client or Mikayla Villanueva in 4 weeks to assess client needs at that time.  Kelton Pillar.Cathye Kreiter MSW, LCSW Licensed Clinical Social Worker [facility] Care Management [phone]

## 2017-12-17 ENCOUNTER — Other Ambulatory Visit: Payer: Self-pay | Admitting: *Deleted

## 2017-12-17 NOTE — Patient Outreach (Signed)
RN CM called patient's daughter [name] to remind her of today's scheduled home visit, [name] states " we're not prepared for you today, we're having out of town guests, we need to reschedule"  RN CM rescheduled home visit for next week.  PLAN See pt for home visit next week  Mikayla ShowsJulie Farmer [facility], BSN [facility] Care Coordinator [phone]

## 2017-12-25 ENCOUNTER — Encounter: Payer: Self-pay | Admitting: *Deleted

## 2017-12-25 ENCOUNTER — Other Ambulatory Visit: Payer: Self-pay | Admitting: *Deleted

## 2017-12-25 NOTE — Patient Outreach (Signed)
[facility]) Care Management   12/25/2017  Mikayla Villanueva 1920-08-16 [id number]  Mikayla Villanueva is an 84 y.o. female  Subjective: Routine home visit with pt, HIPAA verified, daughter Mikayla Villanueva present and reports pt finished with antibiotic prescribed previously by primary care MD, home health continues to work with pt, pt unable to get to podiatrist, daughter states " it's too much trouble and she doesn't want to go anyway"  Pt now has gel cushion and is using, also is staying in bed more and this has helped swelling in legs,  Daughter states pt still refuses for legs to be wrapped, and "bottom looks better, the patches won't stay on and she doesn't like them so we use butt cream"    Objective:   Vitals:   12/25/17 1311  BP: 116/62  Pulse: 68  Resp: 16  SpO2: 94%   ROS  Physical Exam  Constitutional: She appears well-developed.  HENT:  Head: Normocephalic.  Neck: Normal range of motion.  Cardiovascular: Normal rate and regular rhythm.  Respiratory: Effort normal and breath sounds normal.  GI: Soft. Bowel sounds are normal.  Musculoskeletal: Normal range of motion. She exhibits edema.  3+ LLE 2+ RLE  Neurological: She is alert.  Oriented to name, DOB  Skin:  Skin is cool to touch  Psychiatric: She has a normal mood and affect. Her behavior is normal.  Pt is forgetful, has dementia    Encounter Medications:   Outpatient Encounter Medications as of 12/25/2017  Medication Sig Note  . acetaminophen (TYLENOL) 500 MG tablet Take 2 tablets (1,000 mg total) by mouth 3 (three) times daily.   . clobetasol ointment (TEMOVATE) 7.16 % Apply 1 application topically 2 (two) times daily as needed. rash 11/15/2014: Received from: External Pharmacy Received Sig:   . levothyroxine (SYNTHROID, LEVOTHROID) 88 MCG tablet Take 88 mcg by mouth daily.    Marland Kitchen lisinopril (PRINIVIL,ZESTRIL) 20 MG tablet Take 20 mg by mouth daily.   . metoprolol succinate (TOPROL-XL) 50 MG 24 hr tablet Take 50 mg by  mouth daily. Take with or immediately following a meal.   . PROAIR HFA 108 (90 Base) MCG/ACT inhaler INHALE TWO PUFFS EVERY 4 TO 6 HOURS AS NEEDED   . tiotropium (SPIRIVA) 18 MCG inhalation capsule Place 18 mcg into inhaler and inhale daily.   . cefUROXime (CEFTIN) 250 MG tablet Take 1 tablet (250 mg total) by mouth 2 (two) times daily with a meal. (Patient not taking: Reported on 11/15/2014)   . ciprofloxacin (CIPRO) 500 MG tablet Take 1 tablet (500 mg total) by mouth every 12 (twelve) hours. (Patient not taking: Reported on 10/30/2016)   . ibuprofen (ADVIL,MOTRIN) 600 MG tablet Take 1 tablet (600 mg total) by mouth every 8 (eight) hours as needed for moderate pain. (Patient not taking: Reported on 11/18/2017)   . metroNIDAZOLE (FLAGYL) 500 MG tablet Take 1 tablet (500 mg total) by mouth 2 (two) times daily. (Patient not taking: Reported on 10/30/2016)   . simvastatin (ZOCOR) 40 MG tablet Take 40 mg by mouth every evening.    No facility-administered encounter medications on file as of 12/25/2017.     Functional Status:   In your present state of health, do you have any difficulty performing the following activities: 11/18/2017 11/07/2017  Hearing? N N  Vision? - Y  Difficulty concentrating or making decisions? Tempie Donning  Walking or climbing stairs? Y Y  Dressing or bathing? Y Y  Doing errands, shopping? Tempie Donning  Conservation officer, nature and  eating ? Y Y  Using the Toilet? Y Y  In the past six months, have you accidently leaked urine? Y N  Do you have problems with loss of bowel control? N N  Managing your Medications? Y Y  Managing your Finances? Tempie Donning  Housekeeping or managing your Housekeeping? Y Y  Some recent data might be hidden    Fall/Depression Screening:    Fall Risk  11/18/2017 11/07/2017 11/04/2017  Falls in the past year? No No No  Risk for fall due to : Impaired balance/gait;Impaired mobility Impaired mobility;Impaired vision -   PHQ 2/9 Scores 11/18/2017 11/07/2017 11/04/2017  PHQ - 2 Score 0 2 -  PHQ-  9 Score - 7 -  Exception Documentation - - Other- indicate reason in comment box  Not completed - - call completed with dtr due to patient's dementia    Assessment:  RN CM reviewed medications with daughter., daughter requests discharge from [place] services citing no further needs, not going to have FL-2 completed at present, daughter will be keeping pt at home, daughter also requests discharge from nursing services citing no further needs.  RN CM sent in basket to [place] with update of pt daughter requesting discharge.  [facility] CM Care Plan Problem One     Most Recent Value  Care Plan Problem One  Knowledge deficit related to dementia  Role Documenting the Problem One  Care Management Coordinator  Care Plan for Problem One  Active  [facility] Long Term Goal   Caregiver will be able to adequately care for pt in the home with available resources or consider another level of care within 60 days  [facility] Long Term Goal Start Date  11/18/17  [facility] Long Term Goal Met Date  12/25/17  Interventions for Problem One Long Term Goal  RN CM reviewed level of care issues, daughter Mikayla Villanueva has decided not to complete FL2 and will be keeping pt at home, Mikayla Villanueva feels pt is doing better since she saw primary care provider and has home health services, pt is staying in bed longer at night elevating legs  [facility] CM Short Term Goal #1   Daughter will make appointment within 7 days for pt to see primary care MD  [facility] CM Short Term Goal #1 Start Date  11/18/17  [facility] CM Short Term Goal #1 Met Date  12/25/17  Interventions for Short Term Goal #1  Pt did see primary care MD, home health was ordered, pt now has gel cushion and is using, pt still refuses to have legs wrapped, edema has improved slightly, pt refuses podiatrist and having toenails trimmed  [facility] CM Short Term Goal #2   Pt will have home health services in place within 30 days  [facility] CM Short Term Goal #2 Start Date  11/18/17  [facility] CM Short Term Goal #2 Met Date  12/25/17   Interventions for Short Term Goal #2  Pt has home health RN services    [facility] CM Care Plan Problem Two     Most Recent Value  Care Plan Problem Two  Broken skin integrity  Role Documenting the Problem Two  Care Management [place] for Problem Two  Active  [facility] CM Short Term Goal #1   Pt will show improvement with healing to skin breakdown on buttocks and improved control of edema to LE BL within 30 days  [facility] CM Short Term Goal #1 Start Date  11/18/17  [facility] CM Short Term Goal #1 Met  Date   12/25/17  Interventions for Short Term Goal #2   RNCM observed buttocks, pt has dry flaky skin with barrier cream intact, no drainage noted, daughter reports pt refuses any type of dressing to buttocks, RN CM reviewed importance of changing positions q 2 hours, keeping skin clean and dry      Plan: discharge pt today CSW continues  Jacqlyn Larsen [facility], [place] Coordinator [phone]

## 2017-12-31 ENCOUNTER — Encounter: Payer: Self-pay | Admitting: Licensed Clinical Social Worker

## 2017-12-31 ENCOUNTER — Other Ambulatory Visit: Payer: Self-pay | Admitting: Licensed Clinical Social Worker

## 2017-12-31 NOTE — Patient Outreach (Signed)
Assessment:  CSW received communication from RN Irving ShowsJulie Farmer regarding client status and requests.  Nigel MormonBarbara Marshall, daughter of client, had communicated to RN Irving ShowsJulie Farmer that she desired for CSW services to be ended as she Britta Mccreedy(Barbara) told Irving ShowsJulie Farmer that client had no further social work needs at this time.  [name] had also requested that nursing services for client be closed as well. Thus, CSW is discharging Shatonia Colford [facility] at request of client's daughter, Nigel MormonBarbara Marshall.  RN Irving ShowsJulie Farmer [facility].    Plan:  CSW is [facility] on 12/31/17 at request of client's daughter.  CSW to fax physician Sancho closure letter to Dr. Conley RollsLe informing Dr. Conley RollsLe that CSW discharged client [facility].  Kelton PillarMichael S.Loran Fleet MSW, LCSW Licensed Clinical Social Worker [facility] Care Management [phone]

## 2018-06-10 ENCOUNTER — Observation Stay (HOSPITAL_COMMUNITY): Payer: Medicare Other

## 2018-06-10 ENCOUNTER — Other Ambulatory Visit: Payer: Self-pay

## 2018-06-10 ENCOUNTER — Emergency Department (HOSPITAL_COMMUNITY): Payer: Medicare Other

## 2018-06-10 ENCOUNTER — Inpatient Hospital Stay (HOSPITAL_COMMUNITY)
Admission: EM | Admit: 2018-06-10 | Discharge: 2018-06-16 | DRG: 690 | Disposition: A | Discharge status: Skilled Nursing Facility | Payer: Medicare Other | Attending: Internal Medicine | Admitting: Internal Medicine

## 2018-06-10 DIAGNOSIS — R627 Adult failure to thrive: Secondary | ICD-10-CM | POA: Diagnosis present

## 2018-06-10 DIAGNOSIS — F0391 Unspecified dementia with behavioral disturbance: Secondary | ICD-10-CM | POA: Diagnosis present

## 2018-06-10 DIAGNOSIS — Z7189 Other specified counseling: Secondary | ICD-10-CM

## 2018-06-10 DIAGNOSIS — R262 Difficulty in walking, not elsewhere classified: Secondary | ICD-10-CM

## 2018-06-10 DIAGNOSIS — Z888 Allergy status to other drugs, medicaments and biological substances status: Secondary | ICD-10-CM

## 2018-06-10 DIAGNOSIS — J45909 Unspecified asthma, uncomplicated: Secondary | ICD-10-CM

## 2018-06-10 DIAGNOSIS — E7800 Pure hypercholesterolemia, unspecified: Secondary | ICD-10-CM | POA: Diagnosis present

## 2018-06-10 DIAGNOSIS — M4850XA Collapsed vertebra, not elsewhere classified, site unspecified, initial encounter for fracture: Secondary | ICD-10-CM

## 2018-06-10 DIAGNOSIS — Z515 Encounter for palliative care: Secondary | ICD-10-CM

## 2018-06-10 DIAGNOSIS — Z79899 Other long term (current) drug therapy: Secondary | ICD-10-CM

## 2018-06-10 DIAGNOSIS — Z882 Allergy status to sulfonamides status: Secondary | ICD-10-CM

## 2018-06-10 DIAGNOSIS — N3000 Acute cystitis without hematuria: Secondary | ICD-10-CM

## 2018-06-10 DIAGNOSIS — F05 Delirium due to known physiological condition: Secondary | ICD-10-CM | POA: Diagnosis present

## 2018-06-10 DIAGNOSIS — R000 Tachycardia, unspecified: Secondary | ICD-10-CM

## 2018-06-10 DIAGNOSIS — M4856XA Collapsed vertebra, not elsewhere classified, lumbar region, initial encounter for fracture: Secondary | ICD-10-CM | POA: Diagnosis present

## 2018-06-10 DIAGNOSIS — Z7951 Long term (current) use of inhaled steroids: Secondary | ICD-10-CM

## 2018-06-10 DIAGNOSIS — I878 Other specified disorders of veins: Secondary | ICD-10-CM

## 2018-06-10 DIAGNOSIS — L89312 Pressure ulcer of right buttock, stage 2: Secondary | ICD-10-CM | POA: Diagnosis present

## 2018-06-10 DIAGNOSIS — R109 Unspecified abdominal pain: Secondary | ICD-10-CM

## 2018-06-10 DIAGNOSIS — R0781 Pleurodynia: Secondary | ICD-10-CM

## 2018-06-10 DIAGNOSIS — Z87440 Personal history of urinary (tract) infections: Secondary | ICD-10-CM

## 2018-06-10 DIAGNOSIS — Z66 Do not resuscitate: Secondary | ICD-10-CM | POA: Diagnosis present

## 2018-06-10 DIAGNOSIS — L8990 Pressure ulcer of unspecified site, unspecified stage: Secondary | ICD-10-CM

## 2018-06-10 DIAGNOSIS — M546 Pain in thoracic spine: Secondary | ICD-10-CM

## 2018-06-10 DIAGNOSIS — M810 Age-related osteoporosis without current pathological fracture: Secondary | ICD-10-CM | POA: Diagnosis present

## 2018-06-10 DIAGNOSIS — F0390 Unspecified dementia without behavioral disturbance: Secondary | ICD-10-CM

## 2018-06-10 DIAGNOSIS — M4854XA Collapsed vertebra, not elsewhere classified, thoracic region, initial encounter for fracture: Secondary | ICD-10-CM | POA: Diagnosis present

## 2018-06-10 DIAGNOSIS — R10819 Abdominal tenderness, unspecified site: Secondary | ICD-10-CM

## 2018-06-10 DIAGNOSIS — L89322 Pressure ulcer of left buttock, stage 2: Secondary | ICD-10-CM | POA: Diagnosis present

## 2018-06-10 DIAGNOSIS — N390 Urinary tract infection, site not specified: Secondary | ICD-10-CM | POA: Diagnosis not present

## 2018-06-10 DIAGNOSIS — Z885 Allergy status to narcotic agent status: Secondary | ICD-10-CM

## 2018-06-10 DIAGNOSIS — E039 Hypothyroidism, unspecified: Secondary | ICD-10-CM

## 2018-06-10 DIAGNOSIS — I10 Essential (primary) hypertension: Secondary | ICD-10-CM

## 2018-06-10 LAB — CBC WITH DIFFERENTIAL/PLATELET
ABS IMMATURE GRANULOCYTES: 0.06 10*3/uL (ref 0.00–0.07)
Basophils Absolute: 0 10*3/uL (ref 0.0–0.1)
Basophils Relative: 0 %
Eosinophils Absolute: 0 10*3/uL (ref 0.0–0.5)
Eosinophils Relative: 0 %
HCT: 44.4 % (ref 36.0–46.0)
HEMOGLOBIN: 14 g/dL (ref 12.0–15.0)
IMMATURE GRANULOCYTES: 1 %
LYMPHS ABS: 1.3 10*3/uL (ref 0.7–4.0)
LYMPHS PCT: 13 %
MCH: 31.1 pg (ref 26.0–34.0)
MCHC: 31.5 g/dL (ref 30.0–36.0)
MCV: 98.7 fL (ref 80.0–100.0)
MONOS PCT: 7 %
Monocytes Absolute: 0.7 10*3/uL (ref 0.1–1.0)
NEUTROS ABS: 7.9 10*3/uL — AB (ref 1.7–7.7)
Neutrophils Relative %: 79 %
Platelets: 256 10*3/uL (ref 150–400)
RBC: 4.5 MIL/uL (ref 3.87–5.11)
RDW: 13.2 % (ref 11.5–15.5)
WBC: 10 10*3/uL (ref 4.0–10.5)
nRBC: 0 % (ref 0.0–0.2)

## 2018-06-10 LAB — URINALYSIS, ROUTINE W REFLEX MICROSCOPIC
BILIRUBIN URINE: NEGATIVE
GLUCOSE, UA: NEGATIVE mg/dL
Hgb urine dipstick: NEGATIVE
KETONES UR: 20 mg/dL — AB
NITRITE: NEGATIVE
PROTEIN: 100 mg/dL — AB
Specific Gravity, Urine: 1.017 (ref 1.005–1.030)
pH: 6 (ref 5.0–8.0)

## 2018-06-10 LAB — I-STAT TROPONIN, ED: TROPONIN I, POC: 0.01 ng/mL (ref 0.00–0.08)

## 2018-06-10 LAB — COMPREHENSIVE METABOLIC PANEL
ALBUMIN: 3.8 g/dL (ref 3.5–5.0)
ALT: 17 U/L (ref 0–44)
AST: 19 U/L (ref 15–41)
Alkaline Phosphatase: 102 U/L (ref 38–126)
Anion gap: 10 (ref 5–15)
BILIRUBIN TOTAL: 1.1 mg/dL (ref 0.3–1.2)
BUN: 24 mg/dL — AB (ref 8–23)
CHLORIDE: 100 mmol/L (ref 98–111)
CO2: 29 mmol/L (ref 22–32)
Calcium: 8.9 mg/dL (ref 8.9–10.3)
Creatinine, Ser: 0.67 mg/dL (ref 0.44–1.00)
GFR calc Af Amer: >60 mL/min (ref >60)
GFR calc non Af Amer: >60 mL/min (ref >60)
GLUCOSE: 143 mg/dL — AB (ref 70–99)
POTASSIUM: 4 mmol/L (ref 3.5–5.1)
SODIUM: 139 mmol/L (ref 135–145)
Total Protein: 7.6 g/dL (ref 6.5–8.1)

## 2018-06-10 LAB — I-STAT CG4 LACTIC ACID, ED: LACTIC ACID, VENOUS: 1.27 mmol/L (ref 0.5–1.9)

## 2018-06-10 LAB — LIPASE, BLOOD: Lipase: 19 U/L (ref 11–51)

## 2018-06-10 LAB — TSH: TSH: 6.93 u[IU]/mL — ABNORMAL HIGH (ref 0.350–4.500)

## 2018-06-10 MED ORDER — ALBUTEROL SULFATE HFA 108 (90 BASE) MCG/ACT IN AERS: 2.0000 | INHALATION_SPRAY | RESPIRATORY_TRACT | Status: DC | PRN

## 2018-06-10 MED ORDER — SODIUM CHLORIDE 0.9 % IV SOLN
INTRAVENOUS | Status: DC | PRN
  Administered 2018-06-10: 500 mL via INTRAVENOUS
  Administered 2018-06-11: 1000 mL via INTRAVENOUS

## 2018-06-10 MED ORDER — SODIUM CHLORIDE 0.9 % IV SOLN: INTRAVENOUS | Status: DC

## 2018-06-10 MED ORDER — ALBUTEROL SULFATE (2.5 MG/3ML) 0.083% IN NEBU: 2.5000 mg | INHALATION_SOLUTION | RESPIRATORY_TRACT | Status: DC | PRN

## 2018-06-10 MED ORDER — LEVOTHYROXINE SODIUM 88 MCG PO TABS
88.0000 ug | ORAL_TABLET | Freq: Every day | ORAL | Status: DC
  Administered 2018-06-11 – 2018-06-16 (×6): 88 ug via ORAL
  Filled 2018-06-10 (×6): qty 1

## 2018-06-10 MED ORDER — ACETAMINOPHEN 325 MG PO TABS
650.0000 mg | ORAL_TABLET | Freq: Once | ORAL | Status: AC
Start: 2018-06-10 — End: 2018-06-10
  Administered 2018-06-10: 650 mg via ORAL

## 2018-06-10 MED ORDER — METOPROLOL SUCCINATE ER 50 MG PO TB24
50.0000 mg | ORAL_TABLET | Freq: Every day | ORAL | Status: DC
  Administered 2018-06-11 – 2018-06-16 (×6): 50 mg via ORAL
  Filled 2018-06-10 (×6): qty 1

## 2018-06-10 MED ORDER — ACETAMINOPHEN 500 MG PO TABS: 1000.0000 mg | ORAL_TABLET | Freq: Three times a day (TID) | ORAL | Status: DC | PRN

## 2018-06-10 MED ORDER — ENOXAPARIN SODIUM 40 MG/0.4ML ~~LOC~~ SOLN
40.0000 mg | Freq: Every day | SUBCUTANEOUS | Status: DC
  Administered 2018-06-10 – 2018-06-15 (×6): 40 mg via SUBCUTANEOUS
  Filled 2018-06-10 (×6): qty 0.4

## 2018-06-10 MED ORDER — SODIUM CHLORIDE 0.9 % IV SOLN
1.0000 g | INTRAVENOUS | Status: DC
  Administered 2018-06-10: 1 g via INTRAVENOUS
  Filled 2018-06-10 (×2): qty 10

## 2018-06-10 MED ORDER — KETOROLAC TROMETHAMINE 15 MG/ML IJ SOLN
15.0000 mg | Freq: Four times a day (QID) | INTRAMUSCULAR | Status: DC | PRN
  Administered 2018-06-10 – 2018-06-14 (×7): 15 mg via INTRAVENOUS
  Filled 2018-06-10 (×7): qty 1

## 2018-06-10 NOTE — ED Provider Notes (Signed)
[facility]-EMERGENCY DEPT Provider Note   CSN: [id number] Arrival date & time: 06/10/18  1348     History   Chief Complaint Chief Complaint  Patient presents with  . Back Pain  . Chest Pain    HPI Mikayla Villanueva is a 84 y.o. female.  Patient with history of dementia presents from home.  When asked why she is here, patient states "I feel pretty good".  Per EMS report, patient is complaining of left-sided rib and chest pain, thoracic back pain, possible UTI.  Patient has chronic urinary tract infections.  Level 5 caveat due to dementia.  Family not at bedside during initial exam.  Once daughter arrived, additional history taken.  Patient with several recent minor falls.  As of 2 days ago, she could no longer ambulate at her baseline with her walker.  Daughter is having trouble taking care of her at home.  They live together.  Patient has become more confused over the past 6 months, especially in the evenings.  Patient has been complaining of back pain to her.     Past Medical History:  Diagnosis Date  . Arthritis   . Diverticulitis   . Hypercholesteremia   . Hyperlipemia   . Hypertension   . Hypothyroidism   . Osteoporosis   . Thyroid disease     Patient Active Problem List   Diagnosis Date Noted  . Compression fracture 01/14/2014  . Cellulitis 01/14/2014  . Unspecified hypothyroidism 06/10/2013  . MRSA nasal colonization 06/08/2013  . Unspecified hereditary and idiopathic peripheral neuropathy 06/08/2013  . Closed left hip fracture (HCC) 06/05/2013  . Hypertension 06/05/2013  . COPD (chronic obstructive pulmonary disease) (HCC) 06/05/2013  . UTI (lower urinary tract infection) 06/05/2013  . Diverticulitis of colon with perforation, percutaneous drainage 12/11/2011    Past Surgical History:  Procedure Laterality Date  . ABDOMINAL HYSTERECTOMY    . ANKLE SURGERY    . BACK SURGERY    . broken wrist Left    left, Dr. Cleophas DunkerWhitfield,  . EYE SURGERY      . HIP ARTHROPLASTY Left 06/07/2013   Procedure: ARTHROPLASTY BIPOLAR HIP;  Surgeon: Loanne DrillingFrank V Aluisio, MD;  Location: [facility] ORS;  Service: Orthopedics;  Laterality: Left;     OB History   None      Home Medications    Prior to Admission medications   Medication Sig Start Date End Date Taking? Authorizing Provider  acetaminophen (TYLENOL) 500 MG tablet Take 1,000 mg by mouth 3 (three) times daily as needed for mild pain.   Yes [provider]  levothyroxine (SYNTHROID, LEVOTHROID) 88 MCG tablet Take 88 mcg by mouth daily.    Yes [provider]  lisinopril (PRINIVIL,ZESTRIL) 20 MG tablet Take 20 mg by mouth daily.   Yes [provider]  metoprolol succinate (TOPROL-XL) 50 MG 24 hr tablet Take 50 mg by mouth daily. Take with or immediately following a meal.   Yes [provider]  PROAIR HFA 108 (90 Base) MCG/ACT inhaler Inhale 2 puffs into the lungs every 4 (four) hours as needed for wheezing or shortness of breath.  10/16/16  Yes [provider]  acetaminophen (TYLENOL) 500 MG tablet Take 2 tablets (1,000 mg total) by mouth 3 (three) times daily. Patient not taking: Reported on 06/10/2018 01/16/14   Jeralyn BennettZamora, Ezequiel, MD  cefUROXime (CEFTIN) 250 MG tablet Take 1 tablet (250 mg total) by mouth 2 (two) times daily with a meal. Patient not taking: Reported on 11/15/2014 01/16/14  Jeralyn Bennett, MD  ciprofloxacin (CIPRO) 500 MG tablet Take 1 tablet (500 mg total) by mouth every 12 (twelve) hours. Patient not taking: Reported on 10/30/2016 11/15/14   Patel-Mills, Lorelle Formosa, PA-C  ibuprofen (ADVIL,MOTRIN) 600 MG tablet Take 1 tablet (600 mg total) by mouth every 8 (eight) hours as needed for moderate pain. Patient not taking: Reported on 11/18/2017 01/16/14   Jeralyn Bennett, MD  metroNIDAZOLE (FLAGYL) 500 MG tablet Take 1 tablet (500 mg total) by mouth 2 (two) times daily. Patient not taking: Reported on 10/30/2016 11/15/14   Catha Gosselin, PA-C    Family  History Family History  Problem Relation Age of Onset  . Heart disease Mother   . Stroke Father     Social History Social History   Tobacco Use  . Smoking status: Never Smoker  . Smokeless tobacco: Never Used  Substance Use Topics  . Alcohol use: No    Frequency: Never  . Drug use: No     Allergies   Benadryl [diphenhydramine]; Codeine; Morphine and related; Sulfa antibiotics; Tramadol; and Nitrofurantoin monohyd macro   Review of Systems Review of Systems  Unable to perform ROS: Dementia     Physical Exam Updated Vital Signs BP (!) 161/102 (BP Location: Right Arm)   Pulse (!) 113   Temp 97.9 F (36.6 C) (Oral)   Resp (!) 26   SpO2 98%   Physical Exam  Constitutional: She appears well-developed and well-nourished.  HENT:  Head: Normocephalic and atraumatic.  Eyes: Conjunctivae are normal. Right eye exhibits no discharge. Left eye exhibits no discharge.  Neck: Normal range of motion. Neck supple.  Cardiovascular: Normal rate, regular rhythm, normal heart sounds and normal pulses.  Pulmonary/Chest: Effort normal and breath sounds normal. She has no wheezes. She has no rhonchi. She has no rales.  Abdominal: Soft. There is no tenderness.  Musculoskeletal:       Right lower leg: She exhibits tenderness and edema.       Left lower leg: She exhibits tenderness and edema.  Neurological: She is alert.  Skin: Skin is warm and dry.  Venous stasis changes of the lower extremities bilaterally.  Patient has a dressing in place over the left ankle.  This was taken down.  Skin appears intact without any active drainage or infection.  Dressing replaced to protect the area.  Psychiatric: She has a normal mood and affect.  Nursing note and vitals reviewed.    ED Treatments / Results  Labs (all labs ordered are listed, but only abnormal results are displayed) Labs Reviewed  COMPREHENSIVE METABOLIC PANEL - Abnormal; Notable for the following components:      Result Value    Glucose, Bld 143 (*)    BUN 24 (*)    All other components within normal limits  CBC WITH DIFFERENTIAL/PLATELET - Abnormal; Notable for the following components:   Neutro Abs 7.9 (*)    All other components within normal limits  URINALYSIS, ROUTINE W REFLEX MICROSCOPIC - Abnormal; Notable for the following components:   Ketones, ur 20 (*)    Protein, ur 100 (*)    Leukocytes, UA SMALL (*)    Bacteria, UA RARE (*)    All other components within normal limits  URINE CULTURE  I-STAT CG4 LACTIC ACID, ED  I-STAT CG4 LACTIC ACID, ED  I-STAT TROPONIN, ED    EKG EKG Interpretation  Date/Time:  Tuesday June 10 2018 15:21:27 EST Ventricular Rate:  82 PR Interval:    QRS Duration: 96 QT  Interval:  358 QTC Calculation: 419 R Axis:   -12 Text Interpretation:  Sinus rhythm Probable left atrial enlargement Left ventricular hypertrophy Artifact in lead(s) I II III aVR aVL aVF V1 V2 No significant change since last tracing Confirmed by Jacalyn Lefevre [phone]) on 06/10/2018 3:51:36 PM  ED ECG REPORT   Date: 06/10/2018  Rate: 123  Rhythm: sinus tachycardia, PACs  QRS Axis: normal  Intervals: normal  ST/T Wave abnormalities: normal  Conduction Disutrbances:none  Narrative Interpretation: sinus tachycardia with PACs vs afib. Poor baseline due to essential tremor of patient.   Old EKG Reviewed: changes noted  I have personally reviewed the EKG tracing.  Radiology Dg Chest 2 View  Result Date: 06/10/2018 CLINICAL DATA:  Pain. EXAM: CHEST - 2 VIEW COMPARISON:  Chest x-ray dated September 15, 2016. FINDINGS: Stable cardiomegaly. Atherosclerotic calcification of the aortic arch. Pulmonary vascular congestion. The lungs remain hyperinflated. No focal consolidation, pleural effusion, or pneumothorax. New age-indeterminate mild T9 compression deformity. Chronic T8, T10, T12, and L1 compression deformities are unchanged. IMPRESSION: 1. Cardiomegaly and pulmonary vascular congestion. 2.  Aortic  atherosclerosis (ICD10-I70.0). 3. New age-indeterminate mild T9 compression deformity. Electronically Signed   By: Obie Dredge M.D.   On: 06/10/2018 15:24    Procedures Procedures (including critical care time)  Medications Ordered in ED Medications  acetaminophen (TYLENOL) tablet 650 mg (650 mg Oral Given 06/10/18 1545)     Initial Impression / Assessment and Plan / ED Course  I have reviewed the triage vital signs and the nursing notes.  Pertinent labs & imaging results that were available during my care of the patient were reviewed by me and considered in my medical decision making (see chart for details).     Patient seen and examined.  Patient in the hallway on initial exam, so full exam cannot be performed.  She will be moved to room.  Initial labs, UA, chest x-ray ordered.  Vital signs reviewed and are as follows: BP (!) 161/102 (BP Location: Right Arm)   Pulse (!) 113   Temp 97.9 F (36.6 C) (Oral)   Resp (!) 26   SpO2 98%   Patient discussed with and seen by Dr. Particia Nearing.  Patient is unable to ambulate.  Additional history taken from the daughter.  Patient has questionable UTI, culture sent.  Also with likely new (given history) thoracic compression fracture seen on chest x-ray.  Patient also with intermittent episodes of tachycardia.  Question sinus tachycardia with PACs versus A. fib.  Patient does not have a history of atrial fibrillation.  Spoke with hospitalist who will see patient.  6:46 PM Spoke with [place] PA-C of neurosurgery. Agrees mild fracture. No surgical indications. Inpatient team may contact them with any concerns during hospitalization, no plans for formal consult.   Final Clinical Impressions(s) / ED Diagnoses   Final diagnoses:  Acute midline thoracic back pain  Acute cystitis without hematuria  Tachycardia  Unable to walk   Admit for above. Will likely need rehab vs long term care.    ED Discharge Orders    None       Renne Crigler, Cordelia Poche 06/10/18 1846    Jacalyn Lefevre, MD 06/12/18 1625

## 2018-06-10 NOTE — ED Notes (Signed)
Patient voided using Purewick

## 2018-06-10 NOTE — ED Notes (Signed)
Attempted to ambulate the patient. Patients daughter reported she cannot stand, and that the patients legs will not support her. RN made aware.

## 2018-06-10 NOTE — H&P (Signed)
History and Physical    Mikayla Villanueva ONG:[id number] DOB: 1920-08-04 DOA: 06/10/2018  PCP: Lenell Antu, DO Patient coming from: Home  Chief Complaint: Back pain  HPI: Mikayla Villanueva is a 84 y.o. female with medical history significant of dementia, hypertension, hyperlipidemia, hypothyroidism presenting to the hospital for evaluation of back pain.  Patient has dementia and history was provided by daughter at bedside.  Daughter states that the patient experienced sudden onset back pain yesterday while sitting.  States she has been having trouble walking recently due to weakness in her legs.  Today it was difficult for her to get up from the couch.  Patient has also been complaining of pain under her left breast.  No recent falls.  States patient was nauseous yesterday but did not vomit.  She has been having regular bowel movements, no diarrhea.  Daughter states that patient has chronic bilateral lower extremity edema and erythema.  She believes her legs look much better than baseline at this time.  States patient has a history of recurrent UTIs.  ED Course: Afebrile, tachycardic, tachypneic, hypertensive, and satting well on room air.  No leukocytosis.  Lactic acid normal.  I-STAT troponin ordered in the ED pending at this time.  EKG not suggestive of ACS.  UA showing small amount of leukocytes, 11-20 WBCs per high-power field, and negative nitrite.  Urine culture pending.  Chest x-ray showing cardiomegaly and pulmonary vascular congestion.  Also showing new age indeterminate mild T9 compression deformity.  Review of Systems: As per HPI otherwise 10 point review of systems negative.  Past Medical History:  Diagnosis Date  . Arthritis   . Diverticulitis   . Hypercholesteremia   . Hyperlipemia   . Hypertension   . Hypothyroidism   . Osteoporosis   . Thyroid disease     Past Surgical History:  Procedure Laterality Date  . ABDOMINAL HYSTERECTOMY    . ANKLE SURGERY    . BACK SURGERY    . broken wrist  Left    left, Dr. Cleophas Dunker,  . EYE SURGERY    . HIP ARTHROPLASTY Left 06/07/2013   Procedure: ARTHROPLASTY BIPOLAR HIP;  Surgeon: Loanne Drilling, MD;  Location: [facility];  Service: Orthopedics;  Laterality: Left;     reports that she has never smoked. She has never used smokeless tobacco. She reports that she does not drink alcohol or use drugs.  Allergies  Allergen Reactions  . Benadryl [Diphenhydramine] Other (See Comments)    Hallucinations  . Codeine Other (See Comments)    Hallucinations  . Fentanyl Other (See Comments)    Aggressive behavior, hallucinations  . Morphine And Related Other (See Comments)    Hallucinations  . Sulfa Antibiotics Hives  . Tramadol Other (See Comments)    Makes legs swell   . Nitrofurantoin Monohyd Macro Hives    Family History  Problem Relation Age of Onset  . Heart disease Mother   . Stroke Father     Prior to Admission medications   Medication Sig Start Date End Date Taking? Authorizing Provider  acetaminophen (TYLENOL) 500 MG tablet Take 1,000 mg by mouth 3 (three) times daily as needed for mild pain.   Yes [provider]  levothyroxine (SYNTHROID, LEVOTHROID) 88 MCG tablet Take 88 mcg by mouth daily.    Yes [provider]  lisinopril (PRINIVIL,ZESTRIL) 20 MG tablet Take 20 mg by mouth daily.   Yes [provider]  metoprolol succinate (TOPROL-XL) 50 MG 24 hr tablet Take 50 mg  by mouth daily. Take with or immediately following a meal.   Yes [provider]  PROAIR HFA 108 (90 Base) MCG/ACT inhaler Inhale 2 puffs into the lungs every 4 (four) hours as needed for wheezing or shortness of breath.  10/16/16  Yes [provider]  acetaminophen (TYLENOL) 500 MG tablet Take 2 tablets (1,000 mg total) by mouth 3 (three) times daily. Patient not taking: Reported on 06/10/2018 01/16/14   Jeralyn Bennett, MD  cefUROXime (CEFTIN) 250 MG tablet Take 1 tablet (250 mg total) by mouth 2 (two) times daily with a  meal. Patient not taking: Reported on 11/15/2014 01/16/14   Jeralyn Bennett, MD  ciprofloxacin (CIPRO) 500 MG tablet Take 1 tablet (500 mg total) by mouth every 12 (twelve) hours. Patient not taking: Reported on 10/30/2016 11/15/14   Patel-Mills, Lorelle Formosa, PA-C  ibuprofen (ADVIL,MOTRIN) 600 MG tablet Take 1 tablet (600 mg total) by mouth every 8 (eight) hours as needed for moderate pain. Patient not taking: Reported on 11/18/2017 01/16/14   Jeralyn Bennett, MD  metroNIDAZOLE (FLAGYL) 500 MG tablet Take 1 tablet (500 mg total) by mouth 2 (two) times daily. Patient not taking: Reported on 10/30/2016 11/15/14   Catha Gosselin, PA-C    Physical Exam: Vitals:   06/10/18 1800 06/10/18 1850 06/10/18 1929 06/10/18 1930  BP: (!) 150/71 (!) 150/71 (!) 158/85 (!) 154/85  Pulse: 74 74 82 (!) 197  Resp: 14 16 18  (!) 21  Temp:   97.9 F (36.6 C)   TempSrc:   Oral   SpO2: 95% 96% 98% 91%    Physical Exam  Constitutional: No distress.  Frail elderly female  HENT:  Head: Normocephalic.  Mouth/Throat: Oropharynx is clear and moist.  Eyes: Right eye exhibits no discharge. Left eye exhibits no discharge.  Neck: Neck supple.  Cardiovascular: Regular rhythm and intact distal pulses.  Tachycardic  Pulmonary/Chest: Effort normal. She has no wheezes. She has no rales.  Anterior lung fields clear to auscultation  Abdominal: Soft. Bowel sounds are normal. She exhibits no distension. There is tenderness. There is guarding.  Right upper quadrant tender to palpation  Musculoskeletal: She exhibits edema.  Bilateral lower extremity erythema and edema.  Per daughter, erythema is much better than baseline. Left lower extremity wrapped in bandage. Left side ribs: nontender to palpation  Neurological:  Awake and alert, oriented to person and place Strength 5 out of 5 in bilateral upper and lower extremities.  No focal motor deficits noted.  Skin: Skin is warm and dry. She is not diaphoretic.  Bilateral lower extremity  erythema, edema, venous stasis changes.  Per daughter, erythema is much better than baseline. Left lower extremity wrapped in bandage.     Labs on Admission: I have personally reviewed following labs and imaging studies  CBC: Recent Labs  Lab 06/10/18 1500  WBC 10.0  NEUTROABS 7.9*  HGB 14.0  HCT 44.4  MCV 98.7  PLT 256   Basic Metabolic Panel: Recent Labs  Lab 06/10/18 1500  NA 139  K 4.0  CL 100  CO2 29  GLUCOSE 143*  BUN 24*  CREATININE 0.67  CALCIUM 8.9   GFR: CrCl cannot be calculated (Unknown ideal weight.). Liver Function Tests: Recent Labs  Lab 06/10/18 1500  AST 19  ALT 17  ALKPHOS 102  BILITOT 1.1  PROT 7.6  ALBUMIN 3.8   No results for input(s): LIPASE, AMYLASE in the last 168 hours. No results for input(s): AMMONIA in the last 168 hours. Coagulation Profile: No  results for input(s): INR, PROTIME in the last 168 hours. Cardiac Enzymes: No results for input(s): CKTOTAL, CKMB, CKMBINDEX, TROPONINI in the last 168 hours. BNP (last 3 results) No results for input(s): PROBNP in the last 8760 hours. HbA1C: No results for input(s): HGBA1C in the last 72 hours. CBG: No results for input(s): GLUCAP in the last 168 hours. Lipid Profile: No results for input(s): CHOL, HDL, LDLCALC, TRIG, CHOLHDL, LDLDIRECT in the last 72 hours. Thyroid Function Tests: No results for input(s): TSH, T4TOTAL, FREET4, T3FREE, THYROIDAB in the last 72 hours. Anemia Panel: No results for input(s): VITAMINB12, FOLATE, FERRITIN, TIBC, IRON, RETICCTPCT in the last 72 hours. Urine analysis:    Component Value Date/Time   COLORURINE YELLOW 06/10/2018 1553   APPEARANCEUR CLEAR 06/10/2018 1553   LABSPEC 1.017 06/10/2018 1553   PHURINE 6.0 06/10/2018 1553   GLUCOSEU NEGATIVE 06/10/2018 1553   HGBUR NEGATIVE 06/10/2018 1553   BILIRUBINUR NEGATIVE 06/10/2018 1553   KETONESUR 20 (A) 06/10/2018 1553   PROTEINUR 100 (A) 06/10/2018 1553   UROBILINOGEN 0.2 11/15/2014 1215    NITRITE NEGATIVE 06/10/2018 1553   LEUKOCYTESUR SMALL (A) 06/10/2018 1553    Radiological Exams on Admission: Dg Chest 2 View  Result Date: 06/10/2018 CLINICAL DATA:  Pain. EXAM: CHEST - 2 VIEW COMPARISON:  Chest x-ray dated September 15, 2016. FINDINGS: Stable cardiomegaly. Atherosclerotic calcification of the aortic arch. Pulmonary vascular congestion. The lungs remain hyperinflated. No focal consolidation, pleural effusion, or pneumothorax. New age-indeterminate mild T9 compression deformity. Chronic T8, T10, T12, and L1 compression deformities are unchanged. IMPRESSION: 1. Cardiomegaly and pulmonary vascular congestion. 2.  Aortic atherosclerosis (ICD10-I70.0). 3. New age-indeterminate mild T9 compression deformity. Electronically Signed   By: Obie Dredge M.D.   On: 06/10/2018 15:24    EKG: Independently reviewed.  Sinus tachycardia, LVH.  Assessment/Plan Principal Problem:   UTI (urinary tract infection) Active Problems:   HTN (hypertension)   Hypothyroidism   Vertebral compression fracture (HCC)   Rib pain   Abdominal tenderness   Venous stasis   Reactive airway disease   Dementia (HCC)   Suspected UTI Afebrile and no leukocytosis.  Lactic acid normal.  Patient was initially tachycardic and tachypneic in the ED. Currently not tachypneic but continues to be tachycardic. UA showing small amount of leukocytes, 11-20 WBCs per high-power field, and negative nitrite.  Daughter mentions history of recurrent UTIs. -Ceftriaxone -IV fluid hydration -Urine culture pending  Vertebral compression fracture Chest x-ray showing new age indeterminate mild T9 compression deformity. -ED provider spoke to [facility] from neurosurgery -no surgical indications at this time. -Pain management: Toradol 15 mg every 6 hours as needed, Tylenol Per chart, patient is sensitive to opiates - hallucinations, aggressive behavior in the setting of baseline dementia.   Left sided rib pain Patient is  pointing to her left lateral left ribs on exam.  No rib fracture reported on 2 view chest x-ray.  Ribs nontender on palpation on exam.  Daughter denies any recent falls. EKG not suggestive of ACS.  -I-STAT troponin ordered in the ED pending at this time  Weakness of legs/difficulty ambulating No focal motor deficits noted on exam. -PT evaluation  Abdominal tenderness Patient noted to have right upper quadrant tenderness and guarding on exam.  Daughter mentions that she was nauseous yesterday but did not have any vomiting.  She has been having regular bowel movements.  LFTs normal. -Abdominal ultrasound -Check lipase level  Pulmonary vascular congestion on chest x-ray Chest x-ray showing cardiomegaly and pulmonary vascular  congestion.  Patient does have bilateral lower extremity edema which appears to be chronic per history provided by daughter at bedside.  She is breathing comfortably on room air and satting well.  Venous stasis, ?cellulitis  Bilateral lower extremity erythema and edema noted on exam. Per daughter, her legs appear much better than baseline.  Patient is afebrile and does not have leukocytosis.  -Will continue ceftriaxone for coverage of UTI and possible cellulitis  -Wound care consult  Hypothyroidism -Continue home Synthroid -Check TSH (patient is tachycardic)   Hypertension -Continue home metoprolol  Reactive airway disease -Stable.  No bronchospasm.  Continue home inhaler.  Dementia -Delirium precautions  DVT prophylaxis: Lovenox Code Status: DNR.  Confirmed with daughter at bedside. Family Communication: Daughter at bedside Disposition Plan: Anticipate discharge to home versus SNF in 1 to 2 days. Consults called: None Admission status: Observation   Mikayla GiovanniVasundhra Caine Barfield MD [facility] Pager [phone]  If 7PM-7AM, please contact night-coverage [email or web address] Password [email or web address]  06/10/2018, 7:44 PM

## 2018-06-10 NOTE — ED Notes (Signed)
[facility] Expected date:  Expected time:  Means of arrival:  Comments: EMS- back pain/decreased mobility

## 2018-06-10 NOTE — Progress Notes (Signed)
Received call from Rhea BleacherJosh Geiple, PA-C regarding patient. Ms Cathey is a 84 year old who presented to ER with acute back pain, left sided chest pain and ?UTI. She is neurologically intact with the exception of baseline dementia. She had a CXR performed in work up which shows age indeterminate T9 compression deformity although new when compared to previous imaging as well as stable chronic T8, T10, T12 and L1 compression fractures. T9 fracture is minor. Given acute pain, rec clam shell TLSO brace which is to be worn when upright and walking. F/U outpt in 4 weeks for monitoring. Please call for any concerns.

## 2018-06-10 NOTE — ED Notes (Signed)
ED TO INPATIENT HANDOFF REPORT  Name/Age/Gender Mikayla Villanueva 84 y.o. female  Code Status    Code Status Orders  (From admission, onward)         Start     Ordered   06/10/18 1857  Do not attempt resuscitation (DNR)  Continuous    Question Answer Comment  In the event of cardiac or respiratory ARREST Do not call a "code blue"   In the event of cardiac or respiratory ARREST Do not perform Intubation, CPR, defibrillation or ACLS   In the event of cardiac or respiratory ARREST Use medication by any route, position, wound care, and other measures to relive pain and suffering. May use oxygen, suction and manual treatment of airway obstruction as needed for comfort.      06/10/18 1859        Code Status History    Date Active Date Inactive Code Status Order ID Comments User Context   01/14/2014 1326 01/16/2014 1914 DNR [id number]  Mikayla Villanueva, Costin M, MD ED   06/07/2013 1048 06/10/2013 1658 Full Code [id number]  Mikayla Villanueva, Frank V, MD Inpatient   06/05/2013 2053 06/07/2013 1048 DNR [id number]  Mikayla Villanueva, Anastassia, MD Inpatient   12/03/2011 1608 12/08/2011 1902 Full Code [id number]  Mikayla Villanueva, [place], [place] ED   10/29/2011 1620 11/05/2011 1807 Full Code [id number]  Mikayla Villanueva, [place], PA Inpatient    Advance Directive Documentation     Most Recent Value  Type of Advance Directive  Out of facility DNR (pink MOST or yellow form)  Pre-existing out of facility DNR order (yellow form or pink MOST form)  -  "MOST" Form in Place?  -      Home/SNF/Other Home  Chief Complaint septic  Level of Care/Admitting Diagnosis ED Disposition    ED Disposition Condition Comment   Admit  [facility][id number]  Level of Care: Telemetry [5]  Admit to tele based on following criteria: Other see comments  Comments: tachycardia  Diagnosis: UTI (urinary tract infection) [[id number]][[id number]]  Admitting Physician: John GiovanniATHORE, VASUNDHRA [[id number]]  Attending Physician: John GiovanniATHORE, VASUNDHRA [[id number]]  PT Class  (Do Not Modify): Observation [104]  PT Acc Code (Do Not Modify): Observation [10022]       Medical History Past Medical History:  Diagnosis Date  . Arthritis   . Diverticulitis   . Hypercholesteremia   . Hyperlipemia   . Hypertension   . Hypothyroidism   . Osteoporosis   . Thyroid disease     Allergies Allergies  Allergen Reactions  . Benadryl [Diphenhydramine] Other (See Comments)    Hallucinations  . Codeine Other (See Comments)    Hallucinations  . Fentanyl Other (See Comments)    Aggressive behavior, hallucinations  . Morphine And Related Other (See Comments)    Hallucinations  . Sulfa Antibiotics Hives  . Tramadol Other (See Comments)    Makes legs swell   . Nitrofurantoin Monohyd Macro Hives    IV Location/Drains/Wounds Patient Lines/Drains/Airways Status   Active Line/Drains/Airways    Name:   Placement date:   Placement time:   Site:   Days:   Peripheral IV 06/10/18 Left Antecubital   06/10/18    -    Antecubital   less than 1          Labs/Imaging Results for orders placed or performed during the hospital encounter of 06/10/18 (from the past 48 hour(s))  Comprehensive metabolic panel     Status: Abnormal   Collection Time: 06/10/18  3:00 PM  Result Value Ref  Range   Sodium 139 135 - 145 mmol/L   Potassium 4.0 3.5 - 5.1 mmol/L   Chloride 100 98 - 111 mmol/L   CO2 29 22 - 32 mmol/L   Glucose, Bld 143 (H) 70 - 99 mg/dL   BUN 24 (H) 8 - 23 mg/dL   Creatinine, Ser 1.61 0.44 - 1.00 mg/dL   Calcium 8.9 8.9 - 09.6 mg/dL   Total Protein 7.6 6.5 - 8.1 g/dL   Albumin 3.8 3.5 - 5.0 g/dL   AST 19 15 - 41 U/L   ALT 17 0 - 44 U/L   Alkaline Phosphatase 102 38 - 126 U/L   Total Bilirubin 1.1 0.3 - 1.2 mg/dL   GFR calc non Af Amer >60 >60 mL/min   GFR calc Af Amer >60 >60 mL/min   Anion gap 10 5 - 15    Comment: Performed at [facility], [address] [place], [place] [address]  CBC with Differential     Status: Abnormal   Collection  Time: 06/10/18  3:00 PM  Result Value Ref Range   WBC 10.0 4.0 - 10.5 K/uL   RBC 4.50 3.87 - 5.11 MIL/uL   Hemoglobin 14.0 12.0 - 15.0 g/dL   HCT 98.1 19.1 - 47.8 %   MCV 98.7 80.0 - 100.0 fL   MCH 31.1 26.0 - 34.0 pg   MCHC 31.5 30.0 - 36.0 g/dL   RDW 29.5 62.1 - 30.8 %   Platelets 256 150 - 400 K/uL   nRBC 0.0 0.0 - 0.2 %   Neutrophils Relative % 79 %   Neutro Abs 7.9 (H) 1.7 - 7.7 K/uL   Lymphocytes Relative 13 %   Lymphs Abs 1.3 0.7 - 4.0 K/uL   Monocytes Relative 7 %   Monocytes Absolute 0.7 0.1 - 1.0 K/uL   Eosinophils Relative 0 %   Eosinophils Absolute 0.0 0.0 - 0.5 K/uL   Basophils Relative 0 %   Basophils Absolute 0.0 0.0 - 0.1 K/uL   Immature Granulocytes 1 %   Abs Immature Granulocytes 0.06 0.00 - 0.07 K/uL    Comment: Performed at [facility], [address], [place], [place] [address]  I-Stat CG4 Lactic Acid, ED     Status: None   Collection Time: 06/10/18  3:14 PM  Result Value Ref Range   Lactic Acid, Venous 1.27 0.5 - 1.9 mmol/L  Urinalysis, Routine w reflex microscopic     Status: Abnormal   Collection Time: 06/10/18  3:53 PM  Result Value Ref Range   Color, Urine YELLOW YELLOW   APPearance CLEAR CLEAR   Specific Gravity, Urine 1.017 1.005 - 1.030   pH 6.0 5.0 - 8.0   Glucose, UA NEGATIVE NEGATIVE mg/dL   Hgb urine dipstick NEGATIVE NEGATIVE   Bilirubin Urine NEGATIVE NEGATIVE   Ketones, ur 20 (A) NEGATIVE mg/dL   Protein, ur 696 (A) NEGATIVE mg/dL   Nitrite NEGATIVE NEGATIVE   Leukocytes, UA SMALL (A) NEGATIVE   RBC / HPF 0-5 0 - 5 RBC/hpf   WBC, UA 11-20 0 - 5 WBC/hpf   Bacteria, UA RARE (A) NONE SEEN   Squamous Epithelial / LPF 0-5 0 - 5    Comment: Performed at [facility], [address], [place], [place] [address]  I-stat troponin, ED     Status: None   Collection Time: 06/10/18  6:43 PM  Result Value Ref Range   Troponin i, poc 0.01 0.00 - 0.08 ng/mL  Comment 3            Comment: Due to the release  kinetics of cTnI, a negative result within the first hours of the onset of symptoms does not rule out myocardial infarction with certainty. If myocardial infarction is still suspected, repeat the test at appropriate intervals.    Dg Chest 2 View  Result Date: 06/10/2018 CLINICAL DATA:  Pain. EXAM: CHEST - 2 VIEW COMPARISON:  Chest x-ray dated September 15, 2016. FINDINGS: Stable cardiomegaly. Atherosclerotic calcification of the aortic arch. Pulmonary vascular congestion. The lungs remain hyperinflated. No focal consolidation, pleural effusion, or pneumothorax. New age-indeterminate mild T9 compression deformity. Chronic T8, T10, T12, and L1 compression deformities are unchanged. IMPRESSION: 1. Cardiomegaly and pulmonary vascular congestion. 2.  Aortic atherosclerosis (ICD10-I70.0). 3. New age-indeterminate mild T9 compression deformity. Electronically Signed   By: Obie Dredge M.D.   On: 06/10/2018 15:24   EKG Interpretation  Date/Time:  Tuesday June 10 2018 15:21:27 EST Ventricular Rate:  82 PR Interval:    QRS Duration: 96 QT Interval:  358 QTC Calculation: 419 R Axis:   -12 Text Interpretation:  Sinus rhythm Probable left atrial enlargement Left ventricular hypertrophy Artifact in lead(s) I II III aVR aVL aVF V1 V2 No significant change since last tracing Confirmed by Jacalyn Lefevre [phone]) on 06/10/2018 3:51:36 PM   Pending Labs Unresulted Labs (From admission, onward)    Start     Ordered   06/10/18 1939  Lipase, blood  Add-on,   R     06/10/18 1938   06/10/18 1903  TSH  Once,   R     06/10/18 1902   06/10/18 1714  Urine Culture  Once,   STAT     06/10/18 1713          Vitals/Pain Today's Vitals   06/10/18 1800 06/10/18 1850 06/10/18 1929 06/10/18 1930  BP: (!) 150/71 (!) 150/71 (!) 158/85 (!) 154/85  Pulse: 74 74 82 (!) 197  Resp: 14 16 18  (!) 21  Temp:   97.9 F (36.6 C)   TempSrc:   Oral   SpO2: 95% 96% 98% 91%  PainSc:   0-No pain     Isolation  Precautions No active isolations  Medications Medications  acetaminophen (TYLENOL) tablet 1,000 mg (has no administration in time range)  metoprolol succinate (TOPROL-XL) 24 hr tablet 50 mg (has no administration in time range)  levothyroxine (SYNTHROID, LEVOTHROID) tablet 88 mcg (has no administration in time range)  enoxaparin (LOVENOX) injection 40 mg (has no administration in time range)  0.9 %  sodium chloride infusion ( Intravenous Transfusing/Transfer 06/10/18 1949)  cefTRIAXone (ROCEPHIN) 1 g in sodium chloride 0.9 % 100 mL IVPB (1 g Intravenous Transfusing/Transfer 06/10/18 1949)  ketorolac (TORADOL) 15 MG/ML injection 15 mg (has no administration in time range)  albuterol (PROVENTIL) (2.5 MG/3ML) 0.083% nebulizer solution 2.5 mg (has no administration in time range)  0.9 %  sodium chloride infusion ( Intravenous Transfusing/Transfer 06/10/18 1949)  acetaminophen (TYLENOL) tablet 650 mg (650 mg Oral Given 06/10/18 1545)    Mobility walks with device

## 2018-06-10 NOTE — ED Notes (Signed)
Bed: [facility] Expected date:  Expected time:  Means of arrival:  Comments: Hold for hall b

## 2018-06-10 NOTE — ED Triage Notes (Signed)
Pt arrives via [facility] from home. Per EMS: Pt c/o Left rib and breast pain, thoracic back pain and possible UTI. Per pt: possible fall 2 weeks ago. EMS reports that family stated the pt "tweaked" her back yesterday. Pt has chronic, dark, foul smelling urine.

## 2018-06-11 DIAGNOSIS — R0781 Pleurodynia: Secondary | ICD-10-CM | POA: Diagnosis present

## 2018-06-11 DIAGNOSIS — R627 Adult failure to thrive: Secondary | ICD-10-CM | POA: Diagnosis present

## 2018-06-11 DIAGNOSIS — Z888 Allergy status to other drugs, medicaments and biological substances status: Secondary | ICD-10-CM | POA: Diagnosis not present

## 2018-06-11 DIAGNOSIS — I10 Essential (primary) hypertension: Secondary | ICD-10-CM | POA: Diagnosis present

## 2018-06-11 DIAGNOSIS — Z7189 Other specified counseling: Secondary | ICD-10-CM | POA: Diagnosis not present

## 2018-06-11 DIAGNOSIS — J45909 Unspecified asthma, uncomplicated: Secondary | ICD-10-CM | POA: Diagnosis present

## 2018-06-11 DIAGNOSIS — E039 Hypothyroidism, unspecified: Secondary | ICD-10-CM | POA: Diagnosis present

## 2018-06-11 DIAGNOSIS — M4856XA Collapsed vertebra, not elsewhere classified, lumbar region, initial encounter for fracture: Secondary | ICD-10-CM | POA: Diagnosis present

## 2018-06-11 DIAGNOSIS — Z7951 Long term (current) use of inhaled steroids: Secondary | ICD-10-CM | POA: Diagnosis not present

## 2018-06-11 DIAGNOSIS — L89312 Pressure ulcer of right buttock, stage 2: Secondary | ICD-10-CM | POA: Diagnosis present

## 2018-06-11 DIAGNOSIS — M4854XA Collapsed vertebra, not elsewhere classified, thoracic region, initial encounter for fracture: Secondary | ICD-10-CM | POA: Diagnosis present

## 2018-06-11 DIAGNOSIS — M546 Pain in thoracic spine: Secondary | ICD-10-CM

## 2018-06-11 DIAGNOSIS — E7800 Pure hypercholesterolemia, unspecified: Secondary | ICD-10-CM | POA: Diagnosis present

## 2018-06-11 DIAGNOSIS — Z66 Do not resuscitate: Secondary | ICD-10-CM | POA: Diagnosis present

## 2018-06-11 DIAGNOSIS — L89322 Pressure ulcer of left buttock, stage 2: Secondary | ICD-10-CM | POA: Diagnosis present

## 2018-06-11 DIAGNOSIS — Z882 Allergy status to sulfonamides status: Secondary | ICD-10-CM | POA: Diagnosis not present

## 2018-06-11 DIAGNOSIS — F05 Delirium due to known physiological condition: Secondary | ICD-10-CM | POA: Diagnosis present

## 2018-06-11 DIAGNOSIS — G3183 Dementia with Lewy bodies: Secondary | ICD-10-CM | POA: Diagnosis not present

## 2018-06-11 DIAGNOSIS — S22079D Unspecified fracture of T9-T10 vertebra, subsequent encounter for fracture with routine healing: Secondary | ICD-10-CM | POA: Diagnosis not present

## 2018-06-11 DIAGNOSIS — M810 Age-related osteoporosis without current pathological fracture: Secondary | ICD-10-CM | POA: Diagnosis present

## 2018-06-11 DIAGNOSIS — R109 Unspecified abdominal pain: Secondary | ICD-10-CM

## 2018-06-11 DIAGNOSIS — I878 Other specified disorders of veins: Secondary | ICD-10-CM

## 2018-06-11 DIAGNOSIS — Z885 Allergy status to narcotic agent status: Secondary | ICD-10-CM | POA: Diagnosis not present

## 2018-06-11 DIAGNOSIS — L8990 Pressure ulcer of unspecified site, unspecified stage: Secondary | ICD-10-CM

## 2018-06-11 DIAGNOSIS — F0391 Unspecified dementia with behavioral disturbance: Secondary | ICD-10-CM | POA: Diagnosis present

## 2018-06-11 DIAGNOSIS — Z87440 Personal history of urinary (tract) infections: Secondary | ICD-10-CM | POA: Diagnosis not present

## 2018-06-11 DIAGNOSIS — N390 Urinary tract infection, site not specified: Secondary | ICD-10-CM | POA: Diagnosis present

## 2018-06-11 DIAGNOSIS — Z515 Encounter for palliative care: Secondary | ICD-10-CM | POA: Diagnosis present

## 2018-06-11 DIAGNOSIS — Z79899 Other long term (current) drug therapy: Secondary | ICD-10-CM | POA: Diagnosis not present

## 2018-06-11 MED ORDER — HYDROCERIN EX CREA
TOPICAL_CREAM | Freq: Every day | CUTANEOUS | Status: DC
  Administered 2018-06-11: 12:00:00 via TOPICAL
  Administered 2018-06-12: 1 via TOPICAL
  Administered 2018-06-13 – 2018-06-16 (×4): via TOPICAL
  Filled 2018-06-11: qty 113

## 2018-06-11 MED ORDER — LORAZEPAM 0.5 MG PO TABS
0.5000 mg | ORAL_TABLET | Freq: Once | ORAL | Status: AC
  Administered 2018-06-11: 0.5 mg via ORAL
  Filled 2018-06-11: qty 1

## 2018-06-11 MED ORDER — ACETAMINOPHEN 500 MG PO TABS
1000.0000 mg | ORAL_TABLET | Freq: Three times a day (TID) | ORAL | Status: DC
  Administered 2018-06-11 – 2018-06-16 (×14): 1000 mg via ORAL
  Filled 2018-06-11 (×15): qty 2

## 2018-06-11 MED ORDER — LIDOCAINE 5 % EX PTCH
1.0000 | MEDICATED_PATCH | CUTANEOUS | Status: DC
  Administered 2018-06-11 – 2018-06-16 (×6): 1 via TRANSDERMAL
  Filled 2018-06-11 (×6): qty 1

## 2018-06-11 NOTE — Evaluation (Signed)
Physical Therapy Evaluation Patient Details Name: Mikayla Villanueva MRN: [id number] DOB: 09/21/1920 Today's Date: 06/11/2018   History of Present Illness  84 yo female admitted with decreased ability to ambulate,  compression fracture with new T9. ; PMHx: chronic T8 , T 10 comp fx, HTN, osteoporsis, Le venous stasis ulcers-cellulitis, peripheral neuropathy  Clinical Impression  The patient requires extensive assistance for mobility. Will need to assess ability to tolerate sitting and to recliner. Per daughter, patient has  Not been able to ambulate recently. Pt admitted with above diagnosis. Pt currently with functional limitations due to the deficits listed below (see PT Problem List).  Pt will benefit from skilled PT to increase their independence and safety with mobility to allow discharge to the venue listed below.       Follow Up Recommendations SNF;Supervision/Assistance - 24 hour    Equipment Recommendations  Wheelchair cushion (measurements PT) and cushion if DC to home   Recommendations for Other Services       Precautions / Restrictions Precautions Precautions: Fall Required Braces or Orthoses: Other Brace/Splint Other Brace/Splint: TLSO ordered, not in room. daughter reports that the patient has had bbrace in past and will not wear.      Mobility  Bed Mobility Overal bed mobility: Needs Assistance Bed Mobility: Rolling Rolling: Total assist;+2 for physical assistance         General bed mobility comments: patient  assisted to rolling in bed to have bed changed. patient fearful of falling.  Transfers                 General transfer comment: did not test as patient was in pain and had been turned multiple times.  Ambulation/Gait                Stairs            Wheelchair Mobility    Modified Rankin (Stroke Patients Only)       Balance                                             Pertinent Vitals/Pain Pain Assessment:  Faces Faces Pain Scale: Hurts whole lot Pain Descriptors / Indicators: Discomfort;Grimacing;Guarding;Moaning Pain Intervention(s): Monitored during session;Premedicated before session    Home Living Family/patient expects to be discharged to:: Private residence Living Arrangements: Children Available Help at Discharge: Available 24 hours/day Type of Home: House Home Access: Stairs to enter Entrance Stairs-Rails: Right Entrance Stairs-Number of Steps: 3-4  Home Layout: One level Home Equipment: Environmental consultantWalker - 2 wheels      Prior Function Level of Independence: Needs assistance   Gait / Transfers Assistance Needed: daughter provides care, recently unable to ambulate, had been usuing RW  ADL's / Homemaking Assistance Needed: requires sponge bath        Hand Dominance        Extremity/Trunk Assessment   Upper Extremity Assessment Upper Extremity Assessment: RUE deficits/detail;LUE deficits/detail RUE Deficits / Details: limited shoulder elevation, very tender to toucn LUE Deficits / Details: same as right    Lower Extremity Assessment Lower Extremity Assessment: RLE deficits/detail;LLE deficits/detail RLE Deficits / Details: significant venous ulcers LLE Deficits / Details: same as right    Cervical / Trunk Assessment Cervical / Trunk Assessment: Kyphotic  Communication      Cognition Arousal/Alertness: Awake/alert Behavior During Therapy: Anxious Overall Cognitive Status: History of cognitive impairments -  at baseline                                        General Comments      Exercises     Assessment/Plan    PT Assessment Patient needs continued PT services  PT Problem List Decreased strength;Decreased range of motion;Decreased activity tolerance;Decreased balance;Decreased mobility;Pain;Decreased safety awareness;Decreased knowledge of use of DME;Decreased cognition       PT Treatment Interventions DME instruction;Gait training;Functional  mobility training;Therapeutic activities;Therapeutic exercise;Patient/family education    PT Goals (Current goals can be found in the Care Plan section)  Acute Rehab PT Goals Patient Stated Goal: to be able to walk PT Goal Formulation: With family Time For Goal Achievement: 06/25/18 Potential to Achieve Goals: Fair    Frequency Min 2X/week   Barriers to discharge Decreased caregiver support      Co-evaluation               AM-PAC PT "6 Clicks" Mobility  Outcome Measure Help needed turning from your back to your side while in a flat bed without using bedrails?: Total Help needed moving from lying on your back to sitting on the side of a flat bed without using bedrails?: Total Help needed moving to and from a bed to a chair (including a wheelchair)?: Total Help needed standing up from a chair using your arms (e.g., wheelchair or bedside chair)?: Total Help needed to walk in hospital room?: Total Help needed climbing 3-5 steps with a railing? : Total 6 Click Score: 6    End of Session   Activity Tolerance: Patient limited by pain Patient left: in bed;with call bell/phone within reach;with family/visitor present;with nursing/sitter in room Nurse Communication: Mobility status PT Visit Diagnosis: Unsteadiness on feet (R26.81)    Time: 8119-1478 PT Time Calculation (min) (ACUTE ONLY): 23 min   Charges:   PT Evaluation $PT Eval Low Complexity: 1 Low PT Treatments $Therapeutic Activity: 8-22 mins        Blanchard Kelch PT Acute Rehabilitation Services Pager [phone] Office [phone]   Rada Hay 06/11/2018, 11:50 AM

## 2018-06-11 NOTE — Consult Note (Signed)
WOC Nurse wound consult note Reason for Consult: Consult requested for bilat legs.  Pt has generalized edema, but it has greatly improved since last week, according to her daughter at the bedside.  She wore Una boots several years ago to promote healing to a chronic wound, but cannot tolerate them and use was discontinued.  There are currently no open wounds or drainage requiring topical treatment.  Patchy areas of dark purple bruising to anterior calves, dry loose plaques of peeling skin. Dressing procedure/placement/frequency: Eucerin cream to promote healing to dry skin, float heels to reduce pressure. Discussed plan of care with daughter at the bedside. Please re-consult if further assistance is needed.  Thank-you,  Cammie Mcgeeawn Haeley Fordham MSN, RN, CWOCN, TownerWCN-AP, CNS [phone]

## 2018-06-11 NOTE — Progress Notes (Signed)
PROGRESS NOTE   Mikayla Villanueva  ZOX:[id number]    DOB: 09/11/1920    DOA: 06/10/2018  PCP: Lenell AntuLe, Thao P, DO   I have briefly [facility]  Brief Narrative:  84 year old female, daughter lives with her, PMH of advanced dementia, HTN, HLD, hypothyroid, progressively declining both physically and mentally over the last 6 weeks, presented to ED 11/26 after she developed sudden onset of back pain when she was getting up from the couch on the Saturday prior to admission.  She also has chronic bilateral lower extremity edema and redness for approximately 2 years.  She was admitted for further evaluation and management of acute back pain and T9 compression fracture.  Also suspected UTI and lower extremity cellulitis.   Assessment & Plan:   Principal Problem:   UTI (urinary tract infection) Active Problems:   HTN (hypertension)   Hypothyroidism   Vertebral compression fracture (HCC)   Rib pain   Abdominal tenderness   Venous stasis   Reactive airway disease   Dementia (HCC)   Pressure injury of skin   Acute back pain due to T9 fracture: Likely related to osteoporosis.  As per daughter, sensitive to multiple pain medications.  Schedule Tylenol and added lidocaine patch.  PT and OT evaluation.  PMT consultation for goals of care.  Neurosurgery consulted and recommended TLSO brace to be worn when upright and walking and outpatient follow-up with them in 4 weeks.  Asymptomatic bacteriuria: Patient unable to provide any history but as per daughter, no reported dysuria or abdominal pain.  No fever or leukocytosis.  Discontinued empirically started ceftriaxone.  Left-sided chest wall pain: No rib fracture noted on x-ray but chest x-ray does show chronic T8, T10, T12 and L1 compression fracture along with new age-indeterminate mild T9 compression fracture.  She could be having radiating pain.  Supportive treatment with Tylenol and lidocaine patch as  noted.  Abdominal pain: On admission noted to have RUQ tenderness and guarding with nausea.  Having regular BMs.  LFTs normal.  Ultrasound abdomen without acute findings.  Chronic lower extremity edema: Suspect chronic venous stasis and as per daughter, no recent worsening.  Low index of suspicion for cellulitis.  Afebrile without leukocytosis.  Discontinue empirically started ceftriaxone.  Wound care consult appreciated.  Hypothyroid: Continue Synthroid.  Essential hypertension: Mildly uncontrolled at times.  Continue home dose of metoprolol.  Advanced dementia: As per daughter, of late over the last several weeks as noted patient to become more aggressive.  Adult failure to thrive: Multifactorial related to very advanced age, progressive dementia and multiple comorbidities.  Daughter understands patient's overall poor prognosis.  PMT consulted for goals of care.   DVT prophylaxis: Lovenox Code Status: DNR Family Communication: Discussed in detail with daughter at bedside. Disposition: SNF pending clinical improvement, adequate pain control.   Consultants:  Neurosurgery  Procedures:  None  Antimicrobials:  Ceftriaxone-discontinued   Subjective: Patient alert and oriented only to self.  States that she feels "good" and unable to provide any more history.  As per daughter at bedside, overall better compared to yesterday.  Still reports intermittent pain.  Legs have not recently worsened and are much better compared to prior.  ROS: As above, or unable due to mental status changes.  Objective:  Vitals:   06/10/18 2159 06/11/18 0520 06/11/18 1228 06/11/18 1443  BP:  (!) 158/105 (!) 153/97 (!) 163/94  Pulse:  98 92 76  Resp:  (!) 28  (!) 40  Temp:  98.1 F (36.7 C)  97.6 F (36.4 C)  TempSrc:  Oral  Oral  SpO2:  94%  96%  Weight: 50.3 kg     Height: 5\' 1"  (1.549 m)       Examination:  General exam: Elderly female, small built, frail and chronically ill looking lying  comfortably propped up in bed. Respiratory system: Clear to auscultation. Respiratory effort normal. Cardiovascular system: S1 & S2 heard, RRR. No JVD, murmurs, rubs, gallops or clicks.  1+ pitting bilateral chronic leg edema.  Telemetry personally reviewed: Sinus rhythm. Gastrointestinal system: Abdomen is nondistended, soft and nontender. No organomegaly or masses felt. Normal bowel sounds heard. Central nervous system: Alert and oriented only to self. No focal neurological deficits. Extremities: Symmetric 5 x 5 power. Skin: Chronic edema of bilateral legs along with patchy faint redness and scabs. Psychiatry: Judgement and insight impaired. Mood & affect flat.     Data Reviewed: I have personally reviewed following labs and imaging studies  CBC: Recent Labs  Lab 06/10/18 1500  WBC 10.0  NEUTROABS 7.9*  HGB 14.0  HCT 44.4  MCV 98.7  PLT 256   Basic Metabolic Panel: Recent Labs  Lab 06/10/18 1500  NA 139  K 4.0  CL 100  CO2 29  GLUCOSE 143*  BUN 24*  CREATININE 0.67  CALCIUM 8.9   Liver Function Tests: Recent Labs  Lab 06/10/18 1500  AST 19  ALT 17  ALKPHOS 102  BILITOT 1.1  PROT 7.6  ALBUMIN 3.8      Radiology Studies: Dg Chest 2 View  Result Date: 06/10/2018 CLINICAL DATA:  Pain. EXAM: CHEST - 2 VIEW COMPARISON:  Chest x-ray dated September 15, 2016. FINDINGS: Stable cardiomegaly. Atherosclerotic calcification of the aortic arch. Pulmonary vascular congestion. The lungs remain hyperinflated. No focal consolidation, pleural effusion, or pneumothorax. New age-indeterminate mild T9 compression deformity. Chronic T8, T10, T12, and L1 compression deformities are unchanged. IMPRESSION: 1. Cardiomegaly and pulmonary vascular congestion. 2.  Aortic atherosclerosis (ICD10-I70.0). 3. New age-indeterminate mild T9 compression deformity. Electronically Signed   By: Obie Dredge M.D.   On: 06/10/2018 15:24   US Abdomen Complete  Result Date: 06/10/2018 CLINICAL DATA:   Left-sided abdominal and back pain. EXAM: ABDOMEN ULTRASOUND COMPLETE COMPARISON:  None. FINDINGS: Gallbladder: No gallstones or wall thickening visualized. No sonographic Murphy sign noted by sonographer. Common bile duct: Diameter: 4 mm, within normal limits. Liver: No focal lesion identified. Within normal limits in parenchymal echogenicity. Portal vein is patent on color Doppler imaging with normal direction of blood flow towards the liver. IVC: No abnormality visualized. Pancreas: Visualized portion unremarkable. Spleen: Size and appearance within normal limits. Right Kidney: Length: 9.8 cm. Echogenicity within normal limits. No mass or hydronephrosis visualized. Left Kidney: Length: 9.8 cm. Echogenicity within normal limits. No mass or hydronephrosis visualized. Abdominal aorta: No aneurysm visualized. Atherosclerotic plaque seen throughout the abdominal aorta. Other findings: None. IMPRESSION: Abdominal aortic atherosclerotic plaque. No acute findings or other significant abnormality identified. Electronically Signed   By: Myles Rosenthal M.D.   On: 06/10/2018 20:33        Scheduled Meds: . acetaminophen  1,000 mg Oral TID  . enoxaparin (LOVENOX) injection  40 mg Subcutaneous QHS  . hydrocerin   Topical Daily  . levothyroxine  88 mcg Oral QAC breakfast  . lidocaine  1 patch Transdermal Q24H  . metoprolol succinate  50 mg Oral Daily   Continuous Infusions: . sodium chloride 1,000 mL (06/11/18 0951)  . cefTRIAXone (ROCEPHIN)  IV Stopped (06/10/18 1955)     LOS: 0 days     Marcellus Scott, MD, FACP, [facility]. [facility] Pager [phone]  If 7PM-7AM, please contact night-coverage [email or web address] Password [facility] 06/11/2018, 6:28 PM

## 2018-06-11 NOTE — Progress Notes (Signed)
Physical Therapy Treatment Patient Details Name: Mikayla Villanueva MRN: [id number] DOB: 03/14/1921 Today's Date: 06/11/2018    History of Present Illness 84 yo female admitted with decreased ability to ambulate,  compression fracture with new T9. ; PMHx: chronic T8 , T 10 comp fx, HTN, osteoporsis, Le venous stasis ulcers-cellulitis, peripheral neuropathy    PT Comments    Patient did require 2 assist to stand and take small shuffle steps to recliner. Incontinent of urine throughout transfer. Patient is not  Close to her baseline of being ambulatory. Daughter  Is only caregiver. Continue to progress with mobility. Patient did not complain of back pain so much.   Follow Up Recommendations  SNF;Supervision/Assistance - 24 hour HHPT, aide if has to Dc home      Equipment Recommendations  Wheelchair cushion (measurements PT)    Recommendations for Other Services       Precautions / Restrictions Precautions Precautions: Fall Precaution Comments: severe incontinence Required Braces or Orthoses: Other Brace/Splint Other Brace/Splint: TLSO ordered, not in room. daughter reports that the patient has had bbrace in past and will not wear.    Mobility  Bed Mobility Overal bed mobility: Needs Assistance Bed Mobility: Supine to Sit Rolling: Total assist;+2 for physical assistance;+2 for safety/equipment   Supine to sit: Total assist;+2 for physical assistance;+2 for safety/equipment     General bed mobility comments: assisted with legs and trunk, used bed pad to slide forward to bed edge. Patient supported self once sitting  Transfers Overall transfer level: Needs assistance Equipment used: Rolling walker (2 wheeled) Transfers: Sit to/from [employer] Sit to Stand: Max assist;+2 physical assistance;+2 safety/equipment Stand pivot transfers: Max assist;+2 physical assistance;+2 safety/equipment       General transfer comment: assisted to stand, immediate incontinence of  urine. placed pad for transfer. Patient required 2 assist to rise from bed. able to take shuffle steps to recliner.  Ambulation/Gait                 Stairs             Wheelchair Mobility    Modified Rankin (Stroke Patients Only)       Balance Overall balance assessment: Needs assistance Sitting-balance support: Feet supported;Bilateral upper extremity supported Sitting balance-Leahy Scale: Fair     Standing balance support: During functional activity;Bilateral upper extremity supported Standing balance-Leahy Scale: Poor                              Cognition Arousal/Alertness: Awake/alert Behavior During Therapy: WFL for tasks assessed/performed Overall Cognitive Status: History of cognitive impairments - at baseline                                        Exercises      General Comments        Pertinent Vitals/Pain Pain Assessment: Faces Faces Pain Scale: Hurts whole lot Pain Location: back , arms legs Pain Descriptors / Indicators: Discomfort;Grimacing;Guarding;Moaning Pain Intervention(s): Limited activity within patient's tolerance;Monitored during session    Home Living Family/patient expects to be discharged to:: Private residence Living Arrangements: Children Available Help at Discharge: Available 24 hours/day Type of Home: House Home Access: Stairs to enter Entrance Stairs-Rails: Right Home Layout: One level Home Equipment: Environmental consultant - 2 wheels      Prior Function Level of Independence: Needs assistance  Gait /  Transfers Assistance Needed: daughter provides care, recently unable to ambulate, had been usuing RW ADL's / Homemaking Assistance Needed: requires sponge bath     PT Goals (current goals can now be found in the care plan section) Acute Rehab PT Goals Patient Stated Goal: to be able to walk PT Goal Formulation: With family Time For Goal Achievement: 06/25/18 Potential to Achieve Goals: Fair Progress  towards PT goals: Progressing toward goals    Frequency    Min 2X/week      PT Plan Current plan remains appropriate    Co-evaluation              AM-PAC PT "6 Clicks" Mobility   Outcome Measure  Help needed turning from your back to your side while in a flat bed without using bedrails?: Total Help needed moving from lying on your back to sitting on the side of a flat bed without using bedrails?: Total Help needed moving to and from a bed to a chair (including a wheelchair)?: Total Help needed standing up from a chair using your arms (e.g., wheelchair or bedside chair)?: Total Help needed to walk in hospital room?: Total Help needed climbing 3-5 steps with a railing? : Total 6 Click Score: 6    End of Session Equipment Utilized During Treatment: Gait belt Activity Tolerance: Patient tolerated treatment well Patient left: in chair;with call bell/phone within reach;with chair alarm set;with nursing/sitter in room;with family/visitor present Nurse Communication: Mobility status PT Visit Diagnosis: Unsteadiness on feet (R26.81)     Time: [id number] PT Time Calculation (min) (ACUTE ONLY): 31 min  Charges:  $Therapeutic Activity: 23-37 mins                     Blanchard KelchKaren Eula Jaster PT Acute Rehabilitation Services Pager [phone] Office [phone]    Rada HayHill, Diyana Starrett Elizabeth 06/11/2018, 1:48 PM

## 2018-06-11 NOTE — Progress Notes (Signed)
Palliative Medicine RN Note: Consult order noted.   Due to high consult volume and holiday staffing, there may be a delay seeing this patient. New referrals are triaged/evaluated by our providers and seen accordingly.  Thank you for inviting us to see your patient. If recommendations are needed in the interim, please call our office at [phone] from [phone].  Margret ChanceMelanie G. Pamula Luther, RN, BSN, [facility] Palliative Medicine Team 06/11/2018 12:28 PM Office [phone]

## 2018-06-12 ENCOUNTER — Encounter (HOSPITAL_COMMUNITY): Payer: Self-pay

## 2018-06-12 DIAGNOSIS — M546 Pain in thoracic spine: Secondary | ICD-10-CM

## 2018-06-12 DIAGNOSIS — Z515 Encounter for palliative care: Secondary | ICD-10-CM

## 2018-06-12 DIAGNOSIS — F0280 Dementia in other diseases classified elsewhere without behavioral disturbance: Secondary | ICD-10-CM

## 2018-06-12 DIAGNOSIS — Z7189 Other specified counseling: Secondary | ICD-10-CM

## 2018-06-12 DIAGNOSIS — G3183 Dementia with Lewy bodies: Secondary | ICD-10-CM

## 2018-06-12 LAB — URINE CULTURE

## 2018-06-12 MED ORDER — LISINOPRIL 20 MG PO TABS
20.0000 mg | ORAL_TABLET | Freq: Every day | ORAL | Status: DC
  Administered 2018-06-12 – 2018-06-16 (×5): 20 mg via ORAL
  Filled 2018-06-12 (×4): qty 1

## 2018-06-12 NOTE — Consult Note (Signed)
Consultation Note Date: 06/12/2018   Patient Name: Mikayla Villanueva  DOB: [birth year]  MRN: [id number]  Age / Sex: 84 y.o., female  PCP: Lenell Antu, DO Referring Physician: Elease Etienne, MD  Reason for Consultation: Establishing goals of care  HPI/Patient Profile: 84 y.o. female  admitted on 06/10/2018    Clinical Assessment and Goals of Care:  84 year old lady with a history of dementia, patient and her daughter Britta Mccreedy live together. Patient's husband died 27 years ago. Patient had 2 children her son died a few years ago. Daughter states that the patient has outlived all of her siblings as well. Daughter is 68 years old and is now the only child and primary caregiver. Patient's past medical history significant for advanced dementia and osteoporosis hypothyroidism hypertension and dyslipidemia.  Patient has had failure to thrive, gradual progressive decline over the course of the past 6 weeks. She has not able to be up and about as much. She complained of acute back pain and has been admitted to hospital medicine service for T9 compression fracture suspected urinary tract infection suspected lower showed a cellulitis, history of chronic venous stasis bilateral lower extremities. Patient has had both cognitive and functional decline over the course of the last few weeks.  A palliative consultation has been requested for goals of care discussions.  Patient is a sweet elderly lady resting in bed. Patient's daughter Britta Mccreedy is present at the bedside. I introduce myself and palliative care as follows: Palliative medicine is specialized medical care for people living with serious illness. It focuses on providing relief from the symptoms and stress of a serious illness. The goal is to improve quality of life for both the patient and the family.  Britta Mccreedy states that the patient has been evaluated 2 times by hospice of  [place] [facility]. Overall, patient's daughter states that the patient has been having worsening sundowning and has been more confused than usual even at home and also here at the hospital. We discussed about acute issues pertaining to this particular hospitalization. Discussed about current pain regimen.  Discussed about decline trajectory from dementia standpoint. More recently the patient has not been able to walk. She has had progressive decline. Daughter notes that the patient was turned 89 next week.  Please note additional discussions/recommendations as listed below. Thank you for the consult.  HCPOA  daughter [name] [phone]  SUMMARY OF RECOMMENDATIONS   1. Agree with DNR. 2. Agree with SNF rehab attempt, patient's daughter is requesting for [facility] in [place], [place] as this is closer to her house. Recommend palliative care services follow her over there.  3. Daughter states that the patient has been evaluated twice by [facility], how ever, did not meet hospice enrollment criteria, at that time. Would recommend addition of hospice services, if the patient is going to come home after rehab attempt.  4. Continue current mode of care. Decline trajectory from dementia standpoint discussed with daughter, who is well aware, she states, "she's going  to be almost 89, I know she's not going to live forever."   Code Status/Advance Care Planning:  DNR    Symptom Management:   Agree with scheduled tylenol and lidocaine topical. Daughter states patient is very sensitive to medications. To also have TLSO brace.   Palliative Prophylaxis:   Bowel Regimen   Psycho-social/Spiritual:   Desire for further Chaplaincy support:yes  Additional Recommendations: Caregiving  Support/Resources  Prognosis:   < 6 months?  Discharge Planning: Skilled Nursing Facility for rehab with Palliative care service follow-up      Primary  Diagnoses: Present on Admission: . UTI (urinary tract infection)   I have reviewed the medical record, interviewed the patient and family, and examined the patient. The following aspects are pertinent.  Past Medical History:  Diagnosis Date  . Arthritis   . Diverticulitis   . Hypercholesteremia   . Hyperlipemia   . Hypertension   . Hypothyroidism   . Osteoporosis   . Thyroid disease    Social History   Socioeconomic History  . Marital status: Widowed    Spouse name: Not on file  . Number of children: Not on file  . Years of education: Not on file  . Highest education level: Not on file  Occupational History  . Not on file  Social Needs  . Financial resource strain: Not on file  . Food insecurity:    Worry: Not on file    Inability: Not on file  . Transportation needs:    Medical: Not on file    Non-medical: Not on file  Tobacco Use  . Smoking status: Never Smoker  . Smokeless tobacco: Never Used  Substance and Sexual Activity  . Alcohol use: No    Frequency: Never  . Drug use: No  . Sexual activity: Never  Lifestyle  . Physical activity:    Days per week: Not on file    Minutes per session: Not on file  . Stress: Not on file  Relationships  . Social connections:    Talks on phone: Not on file    Gets together: Not on file    Attends religious service: Not on file    Active member of club or organization: Not on file    Attends meetings of clubs or organizations: Not on file    Relationship status: Not on file  Other Topics Concern  . Not on file  Social History Narrative  . Not on file   Family History  Problem Relation Age of Onset  . Heart disease Mother   . Stroke Father    Scheduled Meds: . acetaminophen  1,000 mg Oral TID  . enoxaparin (LOVENOX) injection  40 mg Subcutaneous QHS  . hydrocerin   Topical Daily  . levothyroxine  88 mcg Oral QAC breakfast  . lidocaine  1 patch Transdermal Q24H  . metoprolol succinate  50 mg Oral Daily    Continuous Infusions: . sodium chloride 1,000 mL (06/11/18 0951)   PRN Meds:.sodium chloride, albuterol, ketorolac Medications Prior to Admission:  Prior to Admission medications   Medication Sig Start Date End Date Taking? Authorizing Provider  acetaminophen (TYLENOL) 500 MG tablet Take 1,000 mg by mouth 3 (three) times daily as needed for mild pain.   Yes [provider]  levothyroxine (SYNTHROID, LEVOTHROID) 88 MCG tablet Take 88 mcg by mouth daily.    Yes [provider]  lisinopril (PRINIVIL,ZESTRIL) 20 MG tablet Take 20 mg by mouth daily.   Yes [provider]  metoprolol succinate (TOPROL-XL) 50 MG 24 hr tablet Take 50 mg by mouth daily. Take with or immediately following a meal.   Yes [provider]  PROAIR HFA 108 (90 Base) MCG/ACT inhaler Inhale 2 puffs into the lungs every 4 (four) hours as needed for wheezing or shortness of breath.  10/16/16  Yes [provider]   Allergies  Allergen Reactions  . Benadryl [Diphenhydramine] Other (See Comments)    Hallucinations  . Codeine Other (See Comments)    Hallucinations  . Fentanyl Other (See Comments)    Aggressive behavior, hallucinations  . Morphine And Related Other (See Comments)    Hallucinations  . Sulfa Antibiotics Hives  . Tramadol Other (See Comments)    Makes legs swell   . Nitrofurantoin Monohyd Macro Hives   Review of Systems Denies pain.   Physical Exam Alert oriented to self Identifies daughter present in the room In no distress She is frail and weak Clear S 1 S 2 Abdomen is soft non tender Has chronic venous stasis changes both legs  Vital Signs: BP (!) 176/85 (BP Location: Right Arm)   Pulse 72   Temp 97.8 F (36.6 C) (Oral)   Resp 20   Ht 5\' 1"  (1.549 m)   Wt 50.3 kg   SpO2 98%   BMI 20.97 kg/m  Pain Scale: 0-10   Pain Score: Asleep   SpO2: SpO2: 98 % O2 Device:SpO2: 98 % O2 Flow Rate: .   IO: Intake/output summary:   Intake/Output  Summary (Last 24 hours) at 06/12/2018 1037 Last data filed at 06/12/2018 16100717 Gross per 24 hour  Intake -  Output 400 ml  Net -400 ml    LBM: Last BM Date: 06/10/18 Baseline Weight: Weight: 50.3 kg Most recent weight: Weight: 50.3 kg     Palliative Assessment/Data:  PPS 30%   Time In:  9.30 Time Out:  10.30 Time Total:   60  Greater than 50%  of this time was spent counseling and coordinating care related to the above assessment and plan.  Signed by: Rosalin HawkingZeba Janes Colegrove, MD  [phone] Please contact Palliative Medicine Team phone at [phone] for questions and concerns.  For individual provider: See Loretha StaplerAmion

## 2018-06-12 NOTE — Progress Notes (Addendum)
PROGRESS NOTE   Mikayla Villanueva  UJW:[id number]    DOB: 1921-02-22    DOA: 06/10/2018  PCP: Lenell Antu, DO   I have briefly reviewed patients previous medical records in [facility]  Brief Narrative:  84 year old female, daughter lives with her, PMH of advanced dementia, HTN, HLD, hypothyroid, progressively declining both physically and mentally over the last 6 weeks, presented to ED 11/26 after she developed sudden onset of back pain when she was getting up from the couch on the Saturday prior to admission.  She also has chronic bilateral lower extremity edema and redness for approximately 2 years.  She was admitted for further evaluation and management of acute back pain and T9 compression fracture.  No clinical UTI or cellulitis, antibiotics discontinued.  Pain better controlled.  CSW consulted for SNF placement.   Assessment & Plan:   Principal Problem:   UTI (urinary tract infection) Active Problems:   HTN (hypertension)   Hypothyroidism   Vertebral compression fracture (HCC)   Rib pain   Abdominal tenderness   Venous stasis   Reactive airway disease   Dementia (HCC)   Pressure injury of skin   Acute back pain due to T9 fracture: Likely related to osteoporosis.  As per daughter, sensitive to multiple pain medications.  Scheduled Tylenol and added lidocaine patch.  PT and OT evaluated and recommend SNF.  PMT consultation for goals of care, can be done in the outpatient setting if they are unable to reach her early enough due to high volume.  Neurosurgery consulted and recommended TLSO brace to be worn when upright and walking and outpatient follow-up with them in 4 weeks.  Pain is better controlled.  Asymptomatic bacteriuria: Patient unable to provide any history but as per daughter, no reported dysuria or abdominal pain.  No fever or leukocytosis.  Discontinued empirically started ceftriaxone.  Left-sided chest wall pain: No rib fracture noted on x-ray but chest x-ray does show  chronic T8, T10, T12 and L1 compression fracture along with new age-indeterminate mild T9 compression fracture.  She could be having radiating pain.  Supportive treatment with Tylenol and lidocaine patch as noted.  Pain is better controlled.  Abdominal pain: On admission noted to have RUQ tenderness and guarding with nausea.  Having regular BMs.  LFTs normal.  Ultrasound abdomen without acute findings.  Resolved.  Chronic lower extremity edema: Suspect chronic venous stasis and as per daughter, no recent worsening.  Low index of suspicion for cellulitis.  Afebrile without leukocytosis.  Discontinued empirically started ceftriaxone.  Wound care consult appreciated.  Hypothyroid: Continue Synthroid.  Essential hypertension: Mildly uncontrolled at times.  Continue Toprol-XL 50 mg daily.  Resume home dose of lisinopril 20 mg daily.  Monitor..  Advanced dementia: As per daughter, of late over the last several weeks has noted patient to become more aggressive.  Adult failure to thrive: Multifactorial related to very advanced age, progressive dementia and multiple comorbidities.  Daughter understands patient's overall poor prognosis.  PMT consulted for goals of care but can be achieved in the outpatient setting.  As per daughter, PCP had hospice evaluate her in the past and she was not eligible.  Eventually patient may need to be in LTC.   DVT prophylaxis: Lovenox Code Status: DNR Family Communication: Discussed in detail with daughter at bedside. Disposition: Patient is medically stable for discharge to SNF pending bed availability.  Clinical social work consulted.   Consultants:  Neurosurgery PMT-pending.  Procedures:  None  Antimicrobials:  Ceftriaxone-discontinued  Subjective: Patient poor historian.  Alert and oriented only to self.  Confused.  As per daughter at bedside, pain was better controlled yesterday, she was up in the chair for several hours, was able to stand with the help  of a walker and take a step or 2 with PT assistance.  Noticed "sundowning".  ROS: As above, or unable due to mental status changes.  Objective:  Vitals:   06/11/18 1443 06/11/18 2033 06/12/18 0443 06/12/18 1214  BP: (!) 163/94 (!) 165/94 (!) 176/85 (!) 155/90  Pulse: 76 76 72 77  Resp: (!) 40 20 20 (!) 32  Temp: 97.6 F (36.4 C) 97.7 F (36.5 C) 97.8 F (36.6 C) 97.8 F (36.6 C)  TempSrc: Oral Oral Oral Oral  SpO2: 96% 98%  95%  Weight:      Height:        Examination:  General exam: Elderly female, small built, frail and chronically ill looking lying comfortably propped up in bed.  She does not appear in any distress. Respiratory system: Clear to auscultation. Respiratory effort normal.  Stable. Cardiovascular system: S1 & S2 heard, RRR. No JVD, murmurs, rubs, gallops or clicks.  1+ pitting bilateral chronic leg edema.   Gastrointestinal system: Abdomen is nondistended, soft and nontender. No organomegaly or masses felt. Normal bowel sounds heard.  Stable. Central nervous system: Alert and oriented only to self. No focal neurological deficits.  Stable Extremities: Symmetric 5 x 5 power. Skin: Chronic edema of bilateral legs along with patchy faint redness and scabs.  No change Psychiatry: Judgement and insight impaired. Mood & affect flat.     Data Reviewed: I have personally reviewed following labs and imaging studies  CBC: Recent Labs  Lab 06/10/18 1500  WBC 10.0  NEUTROABS 7.9*  HGB 14.0  HCT 44.4  MCV 98.7  PLT 256   Basic Metabolic Panel: Recent Labs  Lab 06/10/18 1500  NA 139  K 4.0  CL 100  CO2 29  GLUCOSE 143*  BUN 24*  CREATININE 0.67  CALCIUM 8.9   Liver Function Tests: Recent Labs  Lab 06/10/18 1500  AST 19  ALT 17  ALKPHOS 102  BILITOT 1.1  PROT 7.6  ALBUMIN 3.8      Radiology Studies: Dg Chest 2 View  Result Date: 06/10/2018 CLINICAL DATA:  Pain. EXAM: CHEST - 2 VIEW COMPARISON:  Chest x-ray dated September 15, 2016. FINDINGS:  Stable cardiomegaly. Atherosclerotic calcification of the aortic arch. Pulmonary vascular congestion. The lungs remain hyperinflated. No focal consolidation, pleural effusion, or pneumothorax. New age-indeterminate mild T9 compression deformity. Chronic T8, T10, T12, and L1 compression deformities are unchanged. IMPRESSION: 1. Cardiomegaly and pulmonary vascular congestion. 2.  Aortic atherosclerosis (ICD10-I70.0). 3. New age-indeterminate mild T9 compression deformity. Electronically Signed   By: Obie DredgeWilliam T Derry M.D.   On: 06/10/2018 15:24   [place] Abdomen Complete  Result Date: 06/10/2018 CLINICAL DATA:  Left-sided abdominal and back pain. EXAM: ABDOMEN ULTRASOUND COMPLETE COMPARISON:  None. FINDINGS: Gallbladder: No gallstones or wall thickening visualized. No sonographic Murphy sign noted by sonographer. Common bile duct: Diameter: 4 mm, within normal limits. Liver: No focal lesion identified. Within normal limits in parenchymal echogenicity. Portal vein is patent on color Doppler imaging with normal direction of blood flow towards the liver. IVC: No abnormality visualized. Pancreas: Visualized portion unremarkable. Spleen: Size and appearance within normal limits. Right Kidney: Length: 9.8 cm. Echogenicity within normal limits. No mass or hydronephrosis visualized. Left Kidney: Length: 9.8 cm. Echogenicity within normal limits. No  mass or hydronephrosis visualized. Abdominal aorta: No aneurysm visualized. Atherosclerotic plaque seen throughout the abdominal aorta. Other findings: None. IMPRESSION: Abdominal aortic atherosclerotic plaque. No acute findings or other significant abnormality identified. Electronically Signed   By: Myles Rosenthal M.D.   On: 06/10/2018 20:33        Scheduled Meds: . acetaminophen  1,000 mg Oral TID  . enoxaparin (LOVENOX) injection  40 mg Subcutaneous QHS  . hydrocerin   Topical Daily  . levothyroxine  88 mcg Oral QAC breakfast  . lidocaine  1 patch Transdermal Q24H  .  metoprolol succinate  50 mg Oral Daily   Continuous Infusions: . sodium chloride 1,000 mL (06/11/18 0951)     LOS: 1 day     Marcellus Scott, MD, FACP, [facility]. [facility] Pager [phone]  If 7PM-7AM, please contact night-coverage [email or web address] Password [email or web address] 06/12/2018, 2:03 PM

## 2018-06-13 DIAGNOSIS — R627 Adult failure to thrive: Secondary | ICD-10-CM

## 2018-06-13 MED ORDER — TRAZODONE HCL 50 MG PO TABS
50.0000 mg | ORAL_TABLET | Freq: Once | ORAL | Status: AC
  Administered 2018-06-13: 50 mg via ORAL
  Filled 2018-06-13: qty 1

## 2018-06-13 NOTE — Progress Notes (Signed)
Physical Therapy Treatment Patient Details Name: Mikayla Villanueva MRN: [id number] DOB: 07/21/1920 Today's Date: 06/13/2018    History of Present Illness 84 yo female admitted with decreased ability to ambulate,  compression fracture with new T9. ; PMHx: chronic T8 , T 10 comp fx, HTN, osteoporsis, Le venous stasis ulcers-cellulitis, peripheral neuropathy    PT Comments    Pt assisted with bed mobility to sit at edge of bed.  Pt reports pain in back and welcomed repositioning.  Pt does not have TLSO in room however per previous note, daughter reports pt has had brace before and won't wear.  Pt reports fear of falling which also limited mobility today.  Plan per chart is for d/c to SNF with palliative following.   Follow Up Recommendations  SNF;Supervision/Assistance - 24 hour     Equipment Recommendations  Wheelchair cushion (measurements PT)    Recommendations for Other Services       Precautions / Restrictions Precautions Precautions: Fall Precaution Comments: severe incontinence Required Braces or Orthoses: Other Brace Other Brace: TLSO ordered, not in room. (from previous session: daughter reports that the patient has had brace in past and will not wear.)    Mobility  Bed Mobility Overal bed mobility: Needs Assistance Bed Mobility: Supine to Sit;Sit to Supine     Supine to sit: Min assist;HOB elevated Sit to supine: Mod assist;HOB elevated   General bed mobility comments: attempted to educate pt on log roll technique however she would edge her feet over EOB, assist for trunk upright, LEs onto bed  Transfers                 General transfer comment: pt declined attempting, fearful of falling and reporting pain  Ambulation/Gait                 Stairs             Wheelchair Mobility    Modified Rankin (Stroke Patients Only)       Balance Overall balance assessment: Needs assistance Sitting-balance support: Feet supported Sitting balance-Leahy  Scale: Fair Sitting balance - Comments: pt sat upright at EOB (kyphotic and forward head posture), pt would not allow therapist to scoot her out to edge of bed and bring feet to floor due to very fearful of falling                                    Cognition Arousal/Alertness: Awake/alert Behavior During Therapy: Anxious Overall Cognitive Status: History of cognitive impairments - at baseline                                 General Comments: very fearful of falling      Exercises      General Comments        Pertinent Vitals/Pain Pain Assessment: Faces Faces Pain Scale: Hurts even more Pain Location: back, legs Pain Descriptors / Indicators: Discomfort;Grimacing;Guarding Pain Intervention(s): Repositioned;Monitored during session;Limited activity within patient's tolerance    Home Living                      Prior Function            PT Goals (current goals can now be found in the care plan section) Progress towards PT goals: Progressing toward goals    Frequency  Min 2X/week      PT Plan Current plan remains appropriate    Co-evaluation              AM-PAC PT "6 Clicks" Mobility   Outcome Measure  Help needed turning from your back to your side while in a flat bed without using bedrails?: Total Help needed moving from lying on your back to sitting on the side of a flat bed without using bedrails?: Total Help needed moving to and from a bed to a chair (including a wheelchair)?: Total Help needed standing up from a chair using your arms (e.g., wheelchair or bedside chair)?: Total Help needed to walk in hospital room?: Total Help needed climbing 3-5 steps with a railing? : Total 6 Click Score: 6    End of Session   Activity Tolerance: Patient limited by pain Patient left: with bed alarm set;in bed;with call bell/phone within reach Nurse Communication: Mobility status PT Visit Diagnosis: Other abnormalities of  gait and mobility (R26.89)     Time: 1610-9604 PT Time Calculation (min) (ACUTE ONLY): 16 min  Charges:  $Therapeutic Activity: 8-22 mins                    Zenovia Jarred, PT, DPT Acute Rehabilitation Services Office: [phone] Pager: [phone]  Sarajane Jews 06/13/2018, 3:54 PM

## 2018-06-13 NOTE — [place] FL2 (Signed)
[place] MEDICAID FL2 LEVEL OF CARE SCREENING TOOL     IDENTIFICATION  Patient Name: Mikayla Villanueva Birthdate: 03/08/1921 Sex: female Admission Date (Current Location): 06/10/2018  [facility] [place]   Facility and Address:  [facility],  [address], [place] [id number]      Provider Number: [id number]  Attending Physician Name and Address:  Elease EtienneHongalgi, Anand D, MD  Relative Name and Phone Number:       Current Level of Care: Hospital Recommended Level of Care: Skilled Nursing Facility Prior Approval Number:    Date Approved/Denied:   PASRR Number:    Discharge Plan: SNF    Current Diagnoses: Patient Active Problem List   Diagnosis Date Noted  . Acute midline thoracic back pain   . Palliative care by specialist   . Goals of care, counseling/discussion   . Pressure injury of skin 06/11/2018  . UTI (urinary tract infection) 06/10/2018  . Rib pain 06/10/2018  . Abdominal tenderness 06/10/2018  . Venous stasis 06/10/2018  . Reactive airway disease 06/10/2018  . Dementia (HCC) 06/10/2018  . Vertebral compression fracture (HCC) 01/14/2014  . Cellulitis 01/14/2014  . Hypothyroidism 06/10/2013  . MRSA nasal colonization 06/08/2013  . Unspecified hereditary and idiopathic peripheral neuropathy 06/08/2013  . Closed left hip fracture (HCC) 06/05/2013  . HTN (hypertension) 06/05/2013  . COPD (chronic obstructive pulmonary disease) (HCC) 06/05/2013  . UTI (lower urinary tract infection) 06/05/2013  . Diverticulitis of colon with perforation, percutaneous drainage 12/11/2011    Orientation RESPIRATION BLADDER Height & Weight     Self  Normal Incontinent Weight: 111 lb (50.3 kg) Height:  5\' 1"  (154.9 cm)  BEHAVIORAL SYMPTOMS/MOOD NEUROLOGICAL BOWEL NUTRITION STATUS      Incontinent Diet  AMBULATORY STATUS COMMUNICATION OF NEEDS Skin   Extensive Assist Verbally PU Stage and Appropriate Care( Left Buttocks Stage II Foam/ Dressing PRN.     (Pt has generalized edema)   Dressing procedure/placement/frequency: Eucerin cream to promote healing to dry skin, float heels to reduce pressure.                     Personal Care Assistance Level of Assistance  Bathing, Feeding, Dressing Bathing Assistance: Maximum assistance Feeding assistance: Limited assistance Dressing Assistance: Maximum assistance     Functional Limitations Info  Sight, Hearing, Speech Sight Info: Adequate Hearing Info: Impaired  Speech Info: Adequate    SPECIAL CARE FACTORS FREQUENCY  PT (By licensed PT), OT (By licensed OT)     PT Frequency: 5x/week OT Frequency: 5x/week             Contractures Contractures Info: Not present    Additional Factors Info  Code Status, Allergies, Psychotropic Code Status Info: DNR Allergies Info: Allergies: Benadryl Diphenhydramine, Codeine, Fentanyl, Morphine And Related, Sulfa Antibiotics, Tramadol, Nitrofurantoin Monohyd Macro           Current Medications (06/13/2018):  This is the current hospital active medication list Current Facility-Administered Medications  Medication Dose Route Frequency Provider Last Rate Last Dose  . 0.9 %  sodium chloride infusion   Intravenous PRN John Giovanniathore, Vasundhra, MD 10 mL/hr at 06/11/18 0951 1,000 mL at 06/11/18 0951  . acetaminophen (TYLENOL) tablet 1,000 mg  1,000 mg Oral TID Elease EtienneHongalgi, Anand D, MD   1,000 mg at 06/13/18 1044  . albuterol (PROVENTIL) (2.5 MG/3ML) 0.083% nebulizer solution 2.5 mg  2.5 mg Nebulization Q4H PRN John Giovanniathore, Vasundhra, MD      . enoxaparin (LOVENOX) injection 40 mg  40 mg Subcutaneous QHS John Giovanni, MD   40 mg at 06/12/18 2134  . hydrocerin (EUCERIN) cream   Topical Daily Hongalgi, Anand D, MD      . ketorolac (TORADOL) 15 MG/ML injection 15 mg  15 mg Intravenous Q6H PRN John Giovanni, MD   15 mg at 06/13/18 0534  . levothyroxine (SYNTHROID, LEVOTHROID) tablet 88 mcg  88 mcg Oral QAC breakfast John Giovanni, MD   88 mcg at  06/13/18 0531  . lidocaine (LIDODERM) 5 % 1 patch  1 patch Transdermal Q24H Elease Etienne, MD   1 patch at 06/13/18 1202  . lisinopril (PRINIVIL,ZESTRIL) tablet 20 mg  20 mg Oral Daily Elease Etienne, MD   20 mg at 06/13/18 1044  . metoprolol succinate (TOPROL-XL) 24 hr tablet 50 mg  50 mg Oral Daily John Giovanni, MD   50 mg at 06/13/18 1044     Discharge Medications: Please see discharge summary for a list of discharge medications.  Relevant Imaging Results:  Relevant Lab Results:   Additional Information SSN: [id number]  Clearance Coots, LCSW

## 2018-06-13 NOTE — Plan of Care (Signed)
  Problem: Health Behavior/Discharge Planning: Goal: Ability to manage health-related needs will improve Outcome: Progressing   Problem: Clinical Measurements: Goal: Ability to maintain clinical measurements within normal limits will improve Outcome: Progressing Goal: Will remain free from infection Outcome: Progressing Goal: Diagnostic test results will improve Outcome: Progressing Goal: Respiratory complications will improve Outcome: Progressing Goal: Cardiovascular complication will be avoided Outcome: Progressing   Problem: Nutrition: Goal: Adequate nutrition will be maintained Outcome: Progressing   Problem: Coping: Goal: Level of anxiety will decrease Outcome: Progressing   Problem: Pain Managment: Goal: General experience of comfort will improve Outcome: Progressing   Problem: Safety: Goal: Ability to remain free from injury will improve Outcome: Progressing   Problem: Skin Integrity: Goal: Risk for impaired skin integrity will decrease Outcome: Progressing   

## 2018-06-13 NOTE — Progress Notes (Signed)
PROGRESS NOTE   Lucendia HerrlichFaye Verrilli  GNF:[id number]    DOB: 03/25/1921    DOA: 06/10/2018  PCP: Lenell AntuLe, Thao P, DO   I have briefly reviewed patients [facility].  Brief Narrative:  84 year old female, daughter lives with her, PMH of advanced dementia, HTN, HLD, hypothyroid, progressively declining both physically and mentally over the last 6 weeks, presented to ED 11/26 after she developed sudden onset of back pain when she was getting up from the couch on the Saturday prior to admission.  She also has chronic bilateral lower extremity edema and redness for approximately 2 years.  She was admitted for further evaluation and management of acute back pain and T9 compression fracture.  No clinical UTI or cellulitis, antibiotics discontinued.  Pain better controlled.  CSW consulted for SNF placement.   Assessment & Plan:   Principal Problem:   UTI (urinary tract infection) Active Problems:   HTN (hypertension)   Hypothyroidism   Vertebral compression fracture (HCC)   Rib pain   Abdominal tenderness   Venous stasis   Reactive airway disease   Dementia (HCC)   Pressure injury of skin   Acute midline thoracic back pain   Palliative care by specialist   Goals of care, counseling/discussion   Acute back pain due to T9 fracture: Likely related to osteoporosis.  As per daughter, sensitive to multiple pain medications.  Scheduled Tylenol and added lidocaine patch.  PT and OT evaluated and recommend SNF.  PMT consultation for goals of care, can be done in the outpatient setting if they are unable to reach her early enough due to high volume.  Neurosurgery consulted and recommended TLSO brace to be worn when upright and walking and outpatient follow-up with them in 4 weeks.  Pain is better controlled but fluctuating.  Patient refusing to wear TLSO brace.  Asymptomatic bacteriuria: Patient unable to provide any history but as per daughter, no reported dysuria or abdominal pain.   No fever or leukocytosis.  Discontinued empirically started ceftriaxone.  Left-sided chest wall pain: No rib fracture noted on x-ray but chest x-ray does show chronic T8, T10, T12 and L1 compression fracture along with new age-indeterminate mild T9 compression fracture.  She could be having radiating pain.  Supportive treatment with Tylenol and lidocaine patch as noted.  Pain is better controlled but fluctuating.  Abdominal pain: On admission noted to have RUQ tenderness and guarding with nausea.  Having regular BMs.  LFTs normal.  Ultrasound abdomen without acute findings.  Resolved.  Chronic lower extremity edema: Suspect chronic venous stasis and as per daughter, no recent worsening.  Low index of suspicion for cellulitis.  Afebrile without leukocytosis.  Discontinued empirically started ceftriaxone.  Wound care consult appreciated.  Hypothyroid: Continue Synthroid.  TSH is elevated/6.9 on 11/26 and may consider repeating in 4 weeks.  Essential hypertension: Mildly uncontrolled at times.  Continue Toprol-XL 50 mg daily.  Resumed home dose of lisinopril 20 mg daily.  Blood pressure is better controlled.  Advanced dementia with behavioral abnormalities: As per daughter, of late over the last several weeks has noted patient to become more aggressive.  Ongoing intermittent agitation, more so at night/sundowning.  Adult failure to thrive: Multifactorial related to very advanced age, progressive dementia and multiple comorbidities.  Daughter understands patient's overall poor prognosis.  PMT input 11/28 appreciated: DNR, SNF to attempt rehab (although daughter not optimistic of outcome), palliative care follow-up at SNF, addition of hospice services when patient discharged from SNF to  home if patient goes home.   DVT prophylaxis: Lovenox Code Status: DNR Family Communication: Discussed in detail with daughter at bedside, updated care and answered questions. Disposition: Patient is medically stable  for discharge to SNF pending bed availability, likely 11/30.  Clinical social work consulted.   Consultants:  Neurosurgery PMT  Procedures:  None  Antimicrobials:  Ceftriaxone-discontinued   Subjective: Patient was sleeping and did not attempt to arouse.  As per daughter at bedside, patient had a rough night, confused, wanting to get out of bed, wanted to go home, felt like she was out in the dark.  Ongoing left-sided chest wall pain.  Refused to wear TLSO brace as per nursing.  Not eating much.  ROS: As above, or unable due to mental status changes.  Objective:  Vitals:   06/12/18 0443 06/12/18 1214 06/12/18 2023 06/13/18 1323  BP: (!) 176/85 (!) 155/90 (!) 161/88 (!) 159/79  Pulse: 72 77 75 65  Resp: 20 (!) 32 18 (!) 24  Temp: 97.8 F (36.6 C) 97.8 F (36.6 C) 98 F (36.7 C) 97.7 F (36.5 C)  TempSrc: Oral Oral Oral Oral  SpO2:  95% 95% 95%  Weight:      Height:        Examination: No significant change in clinical exam over the last 2 days.  General exam: Elderly female, small built, frail and chronically ill looking lying comfortably propped up in bed.  She does not appear in any distress. Respiratory system: Clear to auscultation. Respiratory effort normal.  Stable. Cardiovascular system: S1 & S2 heard, RRR. No JVD, murmurs, rubs, gallops or clicks.  1+ pitting bilateral chronic leg edema.   Gastrointestinal system: Abdomen is nondistended, soft and nontender. No organomegaly or masses felt. Normal bowel sounds heard.  Stable. Central nervous system: Sleeping this morning and did not wake her up. No focal neurological deficits.  Stable Extremities: Symmetric 5 x 5 power. Skin: Chronic edema of bilateral legs along with patchy faint redness and scabs.  No change Psychiatry: Judgement and insight impaired. Mood & affect flat.     Data Reviewed: I have personally reviewed following labs and imaging studies  CBC: Recent Labs  Lab 06/10/18 1500  WBC 10.0    NEUTROABS 7.9*  HGB 14.0  HCT 44.4  MCV 98.7  PLT 256   Basic Metabolic Panel: Recent Labs  Lab 06/10/18 1500  NA 139  K 4.0  CL 100  CO2 29  GLUCOSE 143*  BUN 24*  CREATININE 0.67  CALCIUM 8.9   Liver Function Tests: Recent Labs  Lab 06/10/18 1500  AST 19  ALT 17  ALKPHOS 102  BILITOT 1.1  PROT 7.6  ALBUMIN 3.8      Radiology Studies: No results found.      Scheduled Meds: . acetaminophen  1,000 mg Oral TID  . enoxaparin (LOVENOX) injection  40 mg Subcutaneous QHS  . hydrocerin   Topical Daily  . levothyroxine  88 mcg Oral QAC breakfast  . lidocaine  1 patch Transdermal Q24H  . lisinopril  20 mg Oral Daily  . metoprolol succinate  50 mg Oral Daily   Continuous Infusions: . sodium chloride 1,000 mL (06/11/18 0951)     LOS: 2 days     Marcellus Scott, MD, FACP, [facility]. [facility] Pager [phone]  If 7PM-7AM, please contact night-coverage [email or web address] Password [email or web address] 06/13/2018, 5:10 PM

## 2018-06-13 NOTE — Progress Notes (Signed)
Discharge Barrier: CSW cannot obtain Pre-Admission Screening and Resident Review (PASRR) number. SNF- [facility] will not accept the patient without it. PASRR will reopen Monday.   Mikayla BarrackNicole Pasqualina Villanueva, Alexander MtLCSW, MSW Clinical Social Worker  [phone] 06/13/2018  2:40 PM

## 2018-06-13 NOTE — Clinical Social Work Note (Signed)
Clinical Social Work Assessment  Patient Details  Name: Mikayla Villanueva MRN: [id number] Date of Birth: 05/26/1921  Date of referral:  06/13/18               Reason for consult:  Discharge Planning                Permission sought to share information with:  Family Supports, Magazine features editoracility Contact Representative Permission granted to share information::  Yes, Verbal Permission Granted  Name::       Clinical cytogeneticistMarshall,Barbara  Agency::  SNF   Relationship::   Daughter   Contact Information:    [phone]  Housing/Transportation Living arrangements for the past 2 months:  Single Family Home Source of Information:  Adult Children, Patient Patient Interpreter Needed:  None Criminal Activity/Legal Involvement Pertinent to Current Situation/Hospitalization:  No - Comment as needed Significant Relationships:  Adult Children Lives with:  Adult Children Do you feel safe going back to the place where you live?  Yes Need for family participation in patient care:  Yes (Patient has dementia) Care giving concerns:   Patient daughter lives with her, PMH of advanced dementia, HTN, HLD, hypothyroid, progressively declining both physically and mentally over the last 6 weeks, presented to ED 11/26 after she developed sudden onset of back pain when she was getting up from the couch on the Saturday prior to admission.  She also has chronic bilateral lower extremity edema and redness for approximately 2 years.  She was admitted for further evaluation and management of acute back pain and T9 compression fracture.  Medical team thinks the patient will be appropriate for rehab at SNF.   Social Worker assessment / plan:  CSW discussed discharge planning with the patient daughter. Daughter reports the patient needs assistance with all her ADL's and uses a walker at baseline.   Patient [facility] due to the location and convinence of their home and knowledge of their care.  CSW called the facility and  unfortunately there was no one to assist in admitting. CSW disussed other SNF options in the area. Patient daughter agreeable to [facility] for short rehab.  CSW inform patient daughter about insurance approval and needing a 3 night inpatient stay.  CSW completed FL2.   Plan: SNF- [facility], Patient will transport by [facility]  Employment status:  Retired Health and safety inspectornsurance information:  Medicare PT Recommendations:    Information / Referral to community resources:  Skilled Nursing Facility  Patient/Family's Response to care:  Agreeable and Responding to care.   Patient/Family's Understanding of and Emotional Response to Diagnosis, Current Treatment, and Prognosis:  Patient daughter very knowledgeable of patient care and needs.   Emotional Assessment Appearance:  Appears stated age Attitude/Demeanor/Rapport:    Affect (typically observed):  Accepting, Pleasant Orientation:  Oriented to Self Alcohol / Substance use:  Not Applicable Psych involvement (Current and /or in the community):  No (Comment)  Discharge Needs  Concerns to be addressed:  Discharge Planning Concerns Readmission within the last 30 days:  No Current discharge risk:  Dependent with Mobility Barriers to Discharge:  Insurance Authorization   Clearance Cootsicole A Tycho Cheramie, LCSW 06/13/2018, 1:13 PM

## 2018-06-14 MED ORDER — TRAZODONE HCL 50 MG PO TABS
50.0000 mg | ORAL_TABLET | Freq: Every day | ORAL | Status: DC
  Administered 2018-06-14: 50 mg via ORAL
  Filled 2018-06-14 (×2): qty 1

## 2018-06-14 NOTE — Progress Notes (Signed)
PROGRESS NOTE   Mikayla Villanueva  ZOX:[id number]    DOB: 04-Dec-1920    DOA: 06/10/2018  PCP: Lenell Antu, DO   I have briefly reviewed patients previous medical records in [facility]  Brief Narrative:  84 year old female, daughter lives with her, PMH of advanced dementia, HTN, HLD, hypothyroid, progressively declining both physically and mentally over the last 6 weeks, presented to ED 11/26 after she developed sudden onset of back pain when she was getting up from the couch on the Saturday prior to admission.  She also has chronic bilateral lower extremity edema and redness for approximately 2 years.  She was admitted for further evaluation and management of acute back pain and T9 compression fracture.  No clinical UTI or cellulitis, antibiotics discontinued.  Pain better controlled.  CSW consulted for SNF placement.  Unable to DC to SNF until 12/2 pending PASSAR.   Assessment & Plan:   Principal Problem:   UTI (urinary tract infection) Active Problems:   HTN (hypertension)   Hypothyroidism   Vertebral compression fracture (HCC)   Rib pain   Abdominal tenderness   Venous stasis   Reactive airway disease   Dementia (HCC)   Pressure injury of skin   Acute midline thoracic back pain   Palliative care by specialist   Goals of care, counseling/discussion   Acute back pain due to T9 fracture: Likely related to osteoporosis.  As per daughter, sensitive to multiple pain medications.  Scheduled Tylenol and added lidocaine patch.  PT and OT evaluated and recommend SNF.  PMT consultation for goals of care, can be done in the outpatient setting if they are unable to reach her early enough due to high volume.  Neurosurgery consulted and recommended TLSO brace to be worn when upright and walking and outpatient follow-up with them in 4 weeks.  Pain is better controlled but fluctuating.  Patient refusing to wear TLSO brace.  No change/stable.  Asymptomatic bacteriuria: Patient unable to provide any  history but as per daughter, no reported dysuria or abdominal pain.  No fever or leukocytosis.  Discontinued empirically started ceftriaxone.  Left-sided chest wall pain: No rib fracture noted on x-ray but chest x-ray does show chronic T8, T10, T12 and L1 compression fracture along with new age-indeterminate mild T9 compression fracture.  She could be having radiating pain.  Supportive treatment with Tylenol and lidocaine patch as noted.  Pain is better controlled but fluctuating.  Abdominal pain: On admission noted to have RUQ tenderness and guarding with nausea.  Having regular BMs.  LFTs normal.  Ultrasound abdomen without acute findings.  Resolved.  Chronic lower extremity edema: Suspect chronic venous stasis and as per daughter, no recent worsening.  Low index of suspicion for cellulitis.  Afebrile without leukocytosis.  Discontinued empirically started ceftriaxone.  Wound care consult appreciated.  Hypothyroid: Continue Synthroid.  TSH is elevated/6.9 on 11/26 and may consider repeating in 4 weeks.  Essential hypertension: Mildly uncontrolled at times.  Continue Toprol-XL 50 mg daily.  Resumed home dose of lisinopril 20 mg daily.  Blood pressure is better controlled.  No change in regimen.  Advanced dementia with behavioral abnormalities: As per daughter, of late over the last several weeks has noted patient to become more aggressive.  Ongoing intermittent agitation, more so at night/sundowning.  Has not been sleeping well at night since hospital admission.  Continue nightly trazodone 50 mg.  Adult failure to thrive: Multifactorial related to very advanced age, progressive dementia and multiple comorbidities.  Daughter understands  patient's overall poor prognosis.  PMT input 11/28 appreciated: DNR, SNF to attempt rehab (although daughter not optimistic of outcome), palliative care follow-up at SNF, addition of hospice services when patient discharged from SNF to home if patient goes  home.   DVT prophylaxis: Lovenox Code Status: DNR Family Communication: Discussed in detail with daughter at bedside. Disposition: Patient is medically stable for discharge to SNF pending bed availability.  As communicated with social work, will not be able to DC to SNF until 12/2 pending PASSAR.   Consultants:  Neurosurgery PMT  Procedures:  None  Antimicrobials:  Ceftriaxone-discontinued   Subjective: Confused.  Intermittently having visual hallucinations as per daughter.  Having difficulty sleeping overnight but did sleep for a couple of hours early this morning.  Received trazodone 50 mg q. at bedtime.  Eating small amounts.  ROS: As above, or unable due to mental status changes.  Objective:  Vitals:   06/12/18 2023 06/13/18 1323 06/13/18 2021 06/14/18 0448  BP: (!) 161/88 (!) 159/79 (!) 174/81 (!) 167/75  Pulse: 75 65 70 63  Resp: 18 (!) 24 20 20   Temp: 98 F (36.7 C) 97.7 F (36.5 C) 97.6 F (36.4 C) 97.7 F (36.5 C)  TempSrc: Oral Oral Oral Oral  SpO2: 95% 95% 96% 97%  Weight:      Height:        Examination:   General exam: Elderly female, small built, frail and chronically ill looking lying comfortably propped up in bed.  She does not appear in any distress.  Smiling. Respiratory system: Clear to auscultation. Respiratory effort normal.  Stable. Cardiovascular system: S1 & S2 heard, RRR. No JVD, murmurs, rubs, gallops or clicks.  1+ pitting bilateral chronic leg edema.  No change/stable. Gastrointestinal system: Abdomen is nondistended, soft and nontender. No organomegaly or masses felt. Normal bowel sounds heard.  Stable. Central nervous system: Alert and oriented to self. No focal neurological deficits.  Stable Extremities: Symmetric 5 x 5 power. Skin: Chronic edema of bilateral legs along with patchy faint redness and scabs.  No change Psychiatry: Judgement and insight impaired. Mood & affect pleasant and smiling this morning.     Data Reviewed: I  have personally reviewed following labs and imaging studies  CBC: Recent Labs  Lab 06/10/18 1500  WBC 10.0  NEUTROABS 7.9*  HGB 14.0  HCT 44.4  MCV 98.7  PLT 256   Basic Metabolic Panel: Recent Labs  Lab 06/10/18 1500  NA 139  K 4.0  CL 100  CO2 29  GLUCOSE 143*  BUN 24*  CREATININE 0.67  CALCIUM 8.9   Liver Function Tests: Recent Labs  Lab 06/10/18 1500  AST 19  ALT 17  ALKPHOS 102  BILITOT 1.1  PROT 7.6  ALBUMIN 3.8      Radiology Studies: No results found.      Scheduled Meds: . acetaminophen  1,000 mg Oral TID  . enoxaparin (LOVENOX) injection  40 mg Subcutaneous QHS  . hydrocerin   Topical Daily  . levothyroxine  88 mcg Oral QAC breakfast  . lidocaine  1 patch Transdermal Q24H  . lisinopril  20 mg Oral Daily  . metoprolol succinate  50 mg Oral Daily   Continuous Infusions: . sodium chloride 1,000 mL (06/11/18 0951)     LOS: 3 days     Marcellus ScottAnand Hongalgi, MD, FACP, [facility]. [facility] Pager [phone]  If 7PM-7AM, please contact night-coverage [email or web address] Password [email or web address] 06/14/2018, 12:00 PM

## 2018-06-14 NOTE — Progress Notes (Signed)
Report received from M. Steffens, RN. No change from initial pm assessment. Will continue to monitor and follow the POC.  

## 2018-06-15 NOTE — Progress Notes (Signed)
PROGRESS NOTE   Mikayla Villanueva  ZOX:[id number]    DOB: 06-17-1921    DOA: 06/10/2018  PCP: Lenell Antu, DO   I have briefly reviewed patients previous medical records in [facility].  Brief Narrative:  84 year old female, daughter lives with her, PMH of advanced dementia, HTN, HLD, hypothyroid, progressively declining both physically and mentally over the last 6 weeks, presented to ED 11/26 after she developed sudden onset of back pain when she was getting up from the couch on the Saturday prior to admission.  She also has chronic bilateral lower extremity edema and redness for approximately 2 years.  She was admitted for further evaluation and management of acute back pain and T9 compression fracture.  No clinical UTI or cellulitis, antibiotics discontinued.  Pain better controlled.  CSW consulted for SNF placement.  Unable to DC to SNF until 12/2 pending PASSAR.   Assessment & Plan:   Principal Problem:   UTI (urinary tract infection) Active Problems:   HTN (hypertension)   Hypothyroidism   Vertebral compression fracture (HCC)   Rib pain   Abdominal tenderness   Venous stasis   Reactive airway disease   Dementia (HCC)   Pressure injury of skin   Acute midline thoracic back pain   Palliative care by specialist   Goals of care, counseling/discussion   Acute back pain due to T9 fracture: Likely related to osteoporosis.  As per daughter, sensitive to multiple pain medications.  Scheduled Tylenol and added lidocaine patch.  PT and OT evaluated and recommend SNF.  PMT consultation for goals of care, can be done in the outpatient setting if they are unable to reach her early enough due to high volume.  Neurosurgery consulted and recommended TLSO brace to be worn when upright and walking and outpatient follow-up with them in 4 weeks.  Pain is better controlled but fluctuating.  Patient refusing to wear TLSO brace.  No change/stable.  Asymptomatic bacteriuria: Patient unable to provide any  history but as per daughter, no reported dysuria or abdominal pain.  No fever or leukocytosis.  Discontinued empirically started ceftriaxone.  Left-sided chest wall pain: No rib fracture noted on x-ray but chest x-ray does show chronic T8, T10, T12 and L1 compression fracture along with new age-indeterminate mild T9 compression fracture.  She could be having radiating pain.  Supportive treatment with Tylenol and lidocaine patch as noted.  Pain is better controlled but fluctuating.  Abdominal pain: On admission noted to have RUQ tenderness and guarding with nausea.  Having regular BMs.  LFTs normal.  Ultrasound abdomen without acute findings.  Resolved.  Chronic lower extremity edema: Suspect chronic venous stasis and as per daughter, no recent worsening.  Low index of suspicion for cellulitis.  Afebrile without leukocytosis.  Discontinued empirically started ceftriaxone.  Wound care consult appreciated.  Hypothyroid: Continue Synthroid.  TSH is elevated/6.9 on 11/26 and may consider repeating in 4 weeks.  Essential hypertension: Mildly uncontrolled at times.  Continue Toprol-XL 50 mg daily.  Resumed home dose of lisinopril 20 mg daily.  Blood pressure is better controlled.  No change in regimen.  Advanced dementia with behavioral abnormalities: As per daughter, of late over the last several weeks has noted patient to become more aggressive.  Ongoing intermittent agitation, more so at night/sundowning.  Has not been sleeping well at night since hospital admission.  Continue nightly trazodone 50 mg.  As per RN, did sleep some last night.  Confused but not agitated.  No family at bedside  to discuss this morning.  Adult failure to thrive: Multifactorial related to very advanced age, progressive dementia and multiple comorbidities.  Daughter understands patient's overall poor prognosis.  PMT input 11/28 appreciated: DNR, SNF to attempt rehab (although daughter not optimistic of outcome), palliative care  follow-up at SNF, addition of hospice services when patient discharged from SNF to home if patient goes home.   DVT prophylaxis: Lovenox Code Status: DNR Family Communication: None at bedside this morning. Disposition: Patient is medically stable for discharge to SNF pending bed availability.  As communicated with social work, will not be able to DC to SNF until 12/2 pending PASSAR.   Consultants:  Neurosurgery PMT  Procedures:  None  Antimicrobials:  Ceftriaxone-discontinued   Subjective: Patient was sleeping this morning.  Did not wake her up.  Discussed with RN, slept some last night without agitation.  Ongoing confusion.  No pain reported.  ROS: As above, or unable due to mental status changes.  Objective:  Vitals:   06/14/18 1421 06/14/18 2120 06/15/18 0508 06/15/18 1414  BP: (!) 190/98 (!) 169/92 (!) 163/87 (!) 157/67  Pulse: 69 82 78 66  Resp: 18 16 16 14   Temp: 97.6 F (36.4 C) 97.8 F (36.6 C) 98.3 F (36.8 C) 97.9 F (36.6 C)  TempSrc: Oral Oral Oral Oral  SpO2: 96% 97% 95% 97%  Weight:      Height:        Examination:   General exam: Elderly female, small built, frail and chronically ill looking lying comfortably propped up in bed.  She does not appear in any distress.   Respiratory system: Clear to auscultation. Respiratory effort normal.  Stable. Cardiovascular system: S1 & S2 heard, RRR. No JVD, murmurs, rubs, gallops or clicks.  1+ pitting bilateral chronic leg edema.  No change/stable. Gastrointestinal system: Abdomen is nondistended, soft and nontender. No organomegaly or masses felt. Normal bowel sounds heard.  Stable. Central nervous system: Sleeping. No focal neurological deficits.  Stable Extremities: Symmetric 5 x 5 power. Skin: Chronic edema of bilateral legs along with patchy faint redness and scabs.  No change Psychiatry: Judgement and insight impaired. Mood & affect not assessed.     Data Reviewed: I have personally reviewed following  labs and imaging studies  CBC: Recent Labs  Lab 06/10/18 1500  WBC 10.0  NEUTROABS 7.9*  HGB 14.0  HCT 44.4  MCV 98.7  PLT 256   Basic Metabolic Panel: Recent Labs  Lab 06/10/18 1500  NA 139  K 4.0  CL 100  CO2 29  GLUCOSE 143*  BUN 24*  CREATININE 0.67  CALCIUM 8.9   Liver Function Tests: Recent Labs  Lab 06/10/18 1500  AST 19  ALT 17  ALKPHOS 102  BILITOT 1.1  PROT 7.6  ALBUMIN 3.8      Radiology Studies: No results found.      Scheduled Meds: . acetaminophen  1,000 mg Oral TID  . enoxaparin (LOVENOX) injection  40 mg Subcutaneous QHS  . hydrocerin   Topical Daily  . levothyroxine  88 mcg Oral QAC breakfast  . lidocaine  1 patch Transdermal Q24H  . lisinopril  20 mg Oral Daily  . metoprolol succinate  50 mg Oral Daily  . traZODone  50 mg Oral QHS   Continuous Infusions: . sodium chloride 1,000 mL (06/11/18 0951)     LOS: 4 days     Marcellus ScottAnand Andreana Klingerman, MD, FACP, [facility]. [facility] Pager [phone]  If 7PM-7AM, please contact night-coverage [email or web address] Password  [email or web address] 06/15/2018, 4:39 PM

## 2018-06-16 DIAGNOSIS — F0391 Unspecified dementia with behavioral disturbance: Secondary | ICD-10-CM

## 2018-06-16 DIAGNOSIS — S22079D Unspecified fracture of T9-T10 vertebra, subsequent encounter for fracture with routine healing: Secondary | ICD-10-CM

## 2018-06-16 DIAGNOSIS — E039 Hypothyroidism, unspecified: Secondary | ICD-10-CM

## 2018-06-16 MED ORDER — HYDROCERIN EX CREA: 1.0000 "application " | TOPICAL_CREAM | Freq: Every day | CUTANEOUS | Status: AC

## 2018-06-16 MED ORDER — LIDOCAINE 5 % EX PTCH: 1.0000 | MEDICATED_PATCH | CUTANEOUS | Status: AC

## 2018-06-16 MED ORDER — TRAZODONE HCL 50 MG PO TABS: 50.0000 mg | ORAL_TABLET | Freq: Every day | ORAL | Status: AC

## 2018-06-16 MED ORDER — ACETAMINOPHEN 500 MG PO TABS: 1000.0000 mg | ORAL_TABLET | Freq: Three times a day (TID) | ORAL | Status: AC

## 2018-06-16 NOTE — Clinical Social Work Placement (Signed)
[facility]- [id number]. Pasrr# [phone]. Notified daughter via phone. Will arrange transportation.    CLINICAL SOCIAL WORK PLACEMENT  NOTE  Date:  06/16/2018  Patient Details  Name: Mikayla Villanueva MRN: [id number] Date of Birth: 10/21/1920  Clinical Social Work is seeking post-discharge placement for this patient at the Skilled  Nursing Facility level of care (*CSW will initial, date and re-position this form in  chart as items are completed):  Yes   Patient/family provided with [place] Clinical Social Work Department's list of facilities offering this level of care within the geographic area requested by the patient (or if unable, by the patient's family).  Yes   Patient/family informed of their freedom to choose among providers that offer the needed level of care, that participate in Medicare, Medicaid or managed care program needed by the patient, have an available bed and are willing to accept the patient.  Yes   Patient/family informed of [facility]'s ownership interest in [facility] and [facility], as well as of the fact that they are under no obligation to receive care at these facilities.  PASRR submitted to EDS on       PASRR number received on       Existing PASRR number confirmed on 06/16/18     FL2 transmitted to all facilities in geographic area requested by pt/family on 06/13/18     FL2 transmitted to all facilities within larger geographic area on       Patient informed that his/her managed care company has contracts with or will negotiate with certain facilities, including the following:        Yes   Patient/family informed of bed offers received.  [facility]     Physician recommends and [facility]    [facility].  Patient to be transferred to facility by [facility]     Patient family notified  on 06/16/18 of transfer.  Name of family member notified:  daughter Mikayla Villanueva     PHYSICIAN       Additional Comment:    _______________________________________________ Nelwyn SalisburyMeghan R Desmen Schoffstall, LCSW 06/16/2018, 1:40 PM [phone]

## 2018-06-16 NOTE — Progress Notes (Signed)
06/16/18  1530  [facility] to [facility] [phone].

## 2018-06-16 NOTE — Discharge Instructions (Signed)

## 2018-06-16 NOTE — Discharge Summary (Signed)
Physician Discharge Summary  Mikayla Villanueva ZOX:[id number] DOB: 06-Mar-1921  PCP: Lenell Antu, DO  Admit date: 06/10/2018 Discharge date: 06/16/2018  Recommendations for Outpatient Follow-up:  1. MD at SNF in 3 to 5 days with repeat labs (CBC & BMP). 2. Recommend repeating TSH in 4 weeks. 3. Recommend palliative care consultation and follow-up at SNF. 4. Clam shell TLSO brace to be worn when upright and walking but patient has been refusing in the hospital. 5. Cindra Presume, PA-C/Neurosurgery in 4 weeks. 6. May consider bone density evaluation and vitamin D supplements at SNF but given her advanced age and overall poor prognosis, not sure of benefits.  Home Health: N/A.  Patient being discharged to [facility]. Equipment/Devices: TBD at SNF  Discharge Condition: Improved and stable.  Her overall prognosis is poor and remains at high risk for decline. CODE STATUS: DNR Diet recommendation: Heart healthy diet.  Wound care instructions: As per WOC RN input from 06/11/2018 as follows:  WOC Nurse wound consult note Reason for Consult: Consult requested for bilat legs.  Pt has generalized edema, but it has greatly improved since last week, according to her daughter at the bedside.  She wore Una boots several years ago to promote healing to a chronic wound, but cannot tolerate them and use was discontinued.  There are currently no open wounds or drainage requiring topical treatment.  Patchy areas of dark purple bruising to anterior calves, dry loose plaques of peeling skin. Dressing procedure/placement/frequency: Eucerin cream to promote healing to dry skin, float heels to reduce pressure. Discussed plan of care with daughter at the bedside.  Discharge Diagnoses:  Principal Problem:   UTI (urinary tract infection) Active Problems:   HTN (hypertension)   Hypothyroidism   Vertebral compression fracture (HCC)   Rib pain   Abdominal tenderness   Venous stasis   Reactive airway disease    Dementia (HCC)   Pressure injury of skin   Acute midline thoracic back pain   Palliative care by specialist   Goals of care, counseling/discussion   Brief Summary: 84 year old female, daughter lives with her, PMH of advanced dementia, HTN, HLD, hypothyroid, progressively declining both physically and mentally over the last 6 weeks, presented to ED 11/26 after she developed sudden onset of back pain when she was getting up from the couch on the Saturday prior to admission.  She also has chronic bilateral lower extremity edema and redness for approximately 2 years.  She was admitted for further evaluation and management of acute back pain and T9 compression fracture.  No clinical UTI or LE cellulitis, antibiotics discontinued.  Pain better controlled.  Palliative care consulted for goals of care.   Assessment & Plan:   Acute back pain due to T9 fracture: Likely related to osteoporosis.  As per daughter, sensitive to multiple pain medications.  Scheduled Tylenol and added lidocaine patch.  PT and OT evaluated and recommend SNF.  Neurosurgery consulted and recommended TLSO brace to be worn when upright and walking and outpatient follow-up with them in 4 weeks. Pain is well controlled.  Patient refusing to wear TLSO brace.   Asymptomatic bacteriuria: Patient unable to provide any history but as per daughter, no reported dysuria or abdominal pain.  No fever or leukocytosis.  Discontinued empirically started ceftriaxone.  Left-sided chest wall pain: No rib fracture noted on x-ray but chest x-ray does show chronic T8, T10, T12 and L1 compression fracture along with new age-indeterminate mild T9 compression fracture.  She could be having radiating pain.  Supportive treatment with Tylenol and lidocaine patch as noted.  Pain is controlled.  Abdominal pain: On admission noted to have RUQ tenderness and guarding with nausea.  Having regular BMs.  LFTs normal.  Ultrasound abdomen without acute findings.   Resolved without recurrence.  Chronic lower extremity edema: Suspect chronic venous stasis and as per daughter, no recent worsening and has actually improved PTA.  Low index of suspicion for cellulitis.  Afebrile without leukocytosis.  Discontinued empirically started ceftriaxone.  Wound care consult appreciated.  Hypothyroid: Continue Synthroid.  TSH is elevated/6.9 on 11/26 and may consider repeating in 4 weeks.  Essential hypertension: Mildly uncontrolled at times.  Continue Toprol-XL 50 mg daily.  Resumed home dose of lisinopril 20 mg daily.  Blood pressure is better controlled.  Not aiming for tight control.  Reasonable control with current regimen.  Advanced dementia with behavioral abnormalities: As per daughter, of late over the last several weeks has noted patient to become more aggressive.  Ongoing intermittent agitation, more so at night/sundowning.  Has not been sleeping well at night since hospital admission.  Started nightly trazodone 50 mg.    Sleeping better.  As per my visit and discussion with RN, no agitation reported.  Adult failure to thrive: Multifactorial related to very advanced age, progressive dementia and multiple comorbidities.  Daughter understands patient's overall poor prognosis.  PMT input 11/28 appreciated: DNR, SNF to attempt rehab (although daughter not optimistic of outcome), palliative care follow-up at SNF, addition of hospice services when patient discharged from SNF to home if patient goes home.   Consultants:  Neurosurgery PMT  Procedures:  None   Discharge Instructions  Discharge Instructions    Call MD for:  difficulty breathing, headache or visual disturbances   Complete by:  As directed    Call MD for:  extreme fatigue   Complete by:  As directed    Call MD for:  persistant dizziness or light-headedness   Complete by:  As directed    Call MD for:  persistant nausea and vomiting   Complete by:  As directed    Call MD for:  redness,  tenderness, or signs of infection (pain, swelling, redness, odor or green/yellow discharge around incision site)   Complete by:  As directed    Call MD for:  severe uncontrolled pain   Complete by:  As directed    Call MD for:  temperature >100.4   Complete by:  As directed    Diet - low sodium heart healthy   Complete by:  As directed    Discharge wound care:   Complete by:  As directed    As per wound care team recommendations as follows:  WOC Nurse wound consult note Reason for Consult: Consult requested for bilat legs.  Pt has generalized edema, but it has greatly improved since last week, according to her daughter at the bedside.  She wore Una boots several years ago to promote healing to a chronic wound, but cannot tolerate them and use was discontinued.  There are currently no open wounds or drainage requiring topical treatment.  Patchy areas of dark purple bruising to anterior calves, dry loose plaques of peeling skin. Dressing procedure/placement/frequency: Eucerin cream to promote healing to dry skin, float heels to reduce pressure. Discussed plan of care with daughter at the bedside.   Increase activity slowly   Complete by:  As directed        Medication List    STOP taking these medications  cefUROXime 250 MG tablet Commonly known as:  CEFTIN   ciprofloxacin 500 MG tablet Commonly known as:  CIPRO   ibuprofen 600 MG tablet Commonly known as:  ADVIL,MOTRIN   metroNIDAZOLE 500 MG tablet Commonly known as:  FLAGYL     TAKE these medications   acetaminophen 500 MG tablet Commonly known as:  TYLENOL Take 2 tablets (1,000 mg total) by mouth 3 (three) times daily. What changed:    when to take this  reasons to take this  Another medication with the same name was removed. Continue taking this medication, and follow the directions you see here.   hydrocerin Crea Apply 1 application topically daily. Apply Eucerin cream to bilat legs Q day after bathing Start  taking on:  06/17/2018   levothyroxine 88 MCG tablet Commonly known as:  SYNTHROID, LEVOTHROID Take 88 mcg by mouth daily.   lidocaine 5 % Commonly known as:  LIDODERM Place 1 patch onto the skin daily. Remove & Discard patch within 12 hours or as directed by MD. Apply to mid back.   lisinopril 20 MG tablet Commonly known as:  PRINIVIL,ZESTRIL Take 20 mg by mouth daily.   metoprolol succinate 50 MG 24 hr tablet Commonly known as:  TOPROL-XL Take 50 mg by mouth daily. Take with or immediately following a meal.   PROAIR HFA 108 (90 Base) MCG/ACT inhaler Generic drug:  albuterol Inhale 2 puffs into the lungs every 4 (four) hours as needed for wheezing or shortness of breath.   traZODone 50 MG tablet Commonly known as:  DESYREL Take 1 tablet (50 mg total) by mouth at bedtime.       Contact information for follow-up providers    MD at SNF. Schedule an appointment as soon as possible for a visit.   Why:  To be seen in 3 to 5 days with repeat labs (CBC & BMP).  Recommend repeating TSH in 4 weeks.  Recommend palliative care consultation and follow-up at SNF.       Le, Thao P, DO. Schedule an appointment as soon as possible for a visit.   Specialty:  Family Medicine Why:  Upon discharge from SNF. Contact information: [address] [place] [address] [phone]        Alyson Ingles, PA-C. Schedule an appointment as soon as possible for a visit in 4 week(s).   Specialty:  Physician Assistant Contact information: [address] [place] [address] [phone]            Contact information for after-discharge care    [facility]   Service:  Skilled Nursing Contact information: [address] [place] [address] [phone]                 Allergies  Allergen Reactions  . Benadryl [Diphenhydramine] Other (See Comments)    Hallucinations  . Codeine Other (See  Comments)    Hallucinations  . Fentanyl Other (See Comments)    Aggressive behavior, hallucinations  . Morphine And Related Other (See Comments)    Hallucinations  . Sulfa Antibiotics Hives  . Tramadol Other (See Comments)    Makes legs swell   . Nitrofurantoin Monohyd Macro Hives      Procedures/Studies: Dg Chest 2 View  Result Date: 06/10/2018 CLINICAL DATA:  Pain. EXAM: CHEST - 2 VIEW COMPARISON:  Chest x-ray dated September 15, 2016. FINDINGS: Stable cardiomegaly. Atherosclerotic calcification of the aortic arch. Pulmonary vascular congestion. The  lungs remain hyperinflated. No focal consolidation, pleural effusion, or pneumothorax. New age-indeterminate mild T9 compression deformity. Chronic T8, T10, T12, and L1 compression deformities are unchanged. IMPRESSION: 1. Cardiomegaly and pulmonary vascular congestion. 2.  Aortic atherosclerosis (ICD10-I70.0). 3. New age-indeterminate mild T9 compression deformity. Electronically Signed   By: Obie Dredge M.D.   On: 06/10/2018 15:24   US Abdomen Complete  Result Date: 06/10/2018 CLINICAL DATA:  Left-sided abdominal and back pain. EXAM: ABDOMEN ULTRASOUND COMPLETE COMPARISON:  None. FINDINGS: Gallbladder: No gallstones or wall thickening visualized. No sonographic Murphy sign noted by sonographer. Common bile duct: Diameter: 4 mm, within normal limits. Liver: No focal lesion identified. Within normal limits in parenchymal echogenicity. Portal vein is patent on color Doppler imaging with normal direction of blood flow towards the liver. IVC: No abnormality visualized. Pancreas: Visualized portion unremarkable. Spleen: Size and appearance within normal limits. Right Kidney: Length: 9.8 cm. Echogenicity within normal limits. No mass or hydronephrosis visualized. Left Kidney: Length: 9.8 cm. Echogenicity within normal limits. No mass or hydronephrosis visualized. Abdominal aorta: No aneurysm visualized. Atherosclerotic plaque seen throughout the  abdominal aorta. Other findings: None. IMPRESSION: Abdominal aortic atherosclerotic plaque. No acute findings or other significant abnormality identified. Electronically Signed   By: Myles Rosenthal M.D.   On: 06/10/2018 20:33      Subjective: Patient is alert and oriented to self.  Smiling.  "I am good".  Denies complaints.  Specifically denies pain.  As per discussion with RN, slept well last night.  No acute findings or agitation noted.  Discharge Exam:  Vitals:   06/15/18 1414 06/15/18 2211 06/16/18 0433 06/16/18 0948  BP: (!) 157/67 (!) 147/76 (!) 185/81 (!) 149/74  Pulse: 66 77 87 82  Resp: 14 16 20 20   Temp: 97.9 F (36.6 C) 97.6 F (36.4 C) 97.9 F (36.6 C)   TempSrc: Oral Oral Oral   SpO2: 97% 93% 96% 100%  Weight:      Height:        General exam: Elderly female, small built, frail and chronically ill looking lying comfortably propped up in bed.  She does not appear in any distress.   Respiratory system: Clear to auscultation. Respiratory effort normal.   Cardiovascular system: S1 & S2 heard, RRR. No JVD, murmurs, rubs, gallops or clicks.  1+ pitting bilateral chronic leg edema.  Gastrointestinal system: Abdomen is nondistended, soft and nontender. No organomegaly or masses felt. Normal bowel sounds heard. Central nervous system:  Mental status as indicated above. No focal neurological deficits.  Extremities: Symmetric 5 x 5 power. Skin: Chronic edema of bilateral legs along with patchy faint redness and scabs, overall stable.   Psychiatry: Judgement and insight impaired. Mood & affect pleasant today.    The results of significant diagnostics from this hospitalization (including imaging, microbiology, ancillary and laboratory) are listed below for reference.     Microbiology: Recent Results (from the past 240 hour(s))  Urine Culture     Status: Abnormal   Collection Time: 06/10/18  3:53 PM  Result Value Ref Range Status   Specimen Description   Final    URINE,  CATHETERIZED Performed at [facility], [address] [place], [place] [address]    Special Requests   Final    NONE Performed at [facility], [address], [place], [place] [address]    Culture MULTIPLE SPECIES PRESENT, SUGGEST RECOLLECTION (A)  Final   Report Status 06/12/2018 FINAL  Final     Labs: CBC:  Recent Labs  Lab 06/10/18 1500  WBC 10.0  NEUTROABS 7.9*  HGB 14.0  HCT 44.4  MCV 98.7  PLT 256   Basic Metabolic Panel: Recent Labs  Lab 06/10/18 1500  NA 139  K 4.0  CL 100  CO2 29  GLUCOSE 143*  BUN 24*  CREATININE 0.67  CALCIUM 8.9   Liver Function Tests: Recent Labs  Lab 06/10/18 1500  AST 19  ALT 17  ALKPHOS 102  BILITOT 1.1  PROT 7.6  ALBUMIN 3.8   Urinalysis    Component Value Date/Time   COLORURINE YELLOW 06/10/2018 1553   APPEARANCEUR CLEAR 06/10/2018 1553   LABSPEC 1.017 06/10/2018 1553   PHURINE 6.0 06/10/2018 1553   GLUCOSEU NEGATIVE 06/10/2018 1553   HGBUR NEGATIVE 06/10/2018 1553   BILIRUBINUR NEGATIVE 06/10/2018 1553   KETONESUR 20 (A) 06/10/2018 1553   PROTEINUR 100 (A) 06/10/2018 1553   UROBILINOGEN 0.2 11/15/2014 1215   NITRITE NEGATIVE 06/10/2018 1553   LEUKOCYTESUR SMALL (A) 06/10/2018 1553      Time coordinating discharge: 40 minutes  SIGNED:  Marcellus Scott, MD, FACP, [facility]. [facility] Pager [phone]  If 7PM-7AM, please contact night-coverage [email or web address] Password [facility] 06/16/2018, 12:27 PM

## 2018-06-16 NOTE — Plan of Care (Signed)
  Problem: Nutrition: Goal: Adequate nutrition will be maintained Outcome: Progressing   Problem: Pain Managment: Goal: General experience of comfort will improve Outcome: Progressing   Problem: Safety: Goal: Ability to remain free from injury will improve Outcome: Progressing   

## 2018-06-16 NOTE — Care Management Important Message (Signed)
Important Message  Patient Details  Name: Mikayla Villanueva MRN: [id number] Date of Birth: 09/09/1920   Medicare Important Message Given:  Yes    [name], [name] 06/16/2018, 11:00 AMImportant Message  Patient Details  Name: Mikayla Villanueva MRN: [id number] Date of Birth: 06/10/1921   Medicare Important Message Given:  Yes    [name], [name] 06/16/2018, 11:00 AM

## 2018-06-19 ENCOUNTER — Non-Acute Institutional Stay: Payer: Medicare Other | Admitting: Internal Medicine

## 2018-06-19 ENCOUNTER — Encounter: Payer: Self-pay | Admitting: Internal Medicine

## 2018-06-19 VITALS — BP 116/60 | HR 80 | Resp 12 | Ht 60.0 in | Wt 101.2 lb

## 2018-06-19 DIAGNOSIS — R627 Adult failure to thrive: Secondary | ICD-10-CM

## 2018-06-19 DIAGNOSIS — R52 Pain, unspecified: Secondary | ICD-10-CM

## 2018-06-19 DIAGNOSIS — F0390 Unspecified dementia without behavioral disturbance: Secondary | ICD-10-CM

## 2018-06-19 NOTE — Progress Notes (Signed)
[facility] Telephone: [phone] Fax: [phone]  PATIENT NAME: Mikayla Villanueva DOB: [birth year] MRN: [id number]  PRIMARY CARE PROVIDER:   Lenell Antu, DO  REFERRING PROVIDER:  Coral Ceo PA-C Admitted to [facility] 06/16/2018 RM [id number] RESPONSIBLE PARTY:   (dtr) [name] [facility]. (grandchild) [name] [phone]  HISTORY OF PRESENT ILLNESS:  Ms. Mikayla Villanueva is a 84 yo female who was living with her daughter. She has a h/o advanced dementia, HTN, HLD, hypothyroid, with progressive decline both physically and mentally over the last 8 weeks or so. She is s/p hospital admission 11/26-12/02/19 for acute back pain and mild T9 compression fracture. CXR revealed chronic T8, T10, T12 and L1 compression fracture. Supportive treatment with Tylenol and lidocaine patch .  Conservative management only planned at this time. She was discharged to [facility] 06/16/18 for rehab. Her overall prognosis was thought to be poor and she is thought to be at high risk for decline. Palliative Care was asked to help address goals of care.   IMPRESSION / RECOMMENDATIONS: .  1. Dementia with past behavioral abnormalities: FAST 6e. Her PPS is 30%. She is oriented to self. Patient knew she wasn't at home. She is lethargic and fatigued. Hospital notes indicate with h/o intermittent agitation and sundowning. She has a h/o insomnia. Patient's baseline is ambulating about slowly with walker. Currently she is a 1 person transfer. She tends to turn rather than move her feet with turns, placing her at a higher risk for falls. She was able to walk on parallel bars about 6 feet, and with walker about 3 feet with encouragement and max assist. Her activity is limited by fatigue. She can feed herself but her oral intake is less than 25%. Her weight is 101.2lbs. At height of 5' her BMI is 19.8kg/m2.  2. Skin breakdown: Stage 2 pressure injuries mid thoracic spine, and scattered small areas  bilateral coccyx area. Facility staff repositioning and applying barrier cream. She has Chronic LE changes of chronic venous stasis. No open areas.   3. Pain r/t T9 compression fracture: C/o mid back pain with tolling side to side. Managed with Tylenol and lidocaine patch. Patient refuses clam shell TLSO brace.   4. Advanced Care Planning: DNR/MOST form on chart/  5. Follow up: TC and message left with daughter [name] [phone]) to discuss goals of care, and possible hospice eligibility depending on her rehab course and if further functional and nutritional decline. I left her my contact call back information.   I spent 75 minutes providing this consultation,  from 11:30am-12:45pm. More than 50% of this time was spent coordinating communication.    CODE STATUS: DNR. MOST: DN/DNI. Comfort level for scope of medical interventions. Antibiotics and IVFs if indicated. No feeding tube.  PPS: very weak 40%  HOSPICE ELIGIBILITY/DIAGNOSIS: Dementia/FTT. Pending further decline.  PAST MEDICAL HISTORY:  Past Medical History:  Diagnosis Date  . Arthritis   . Diverticulitis   . Hypercholesteremia   . Hyperlipemia   . Hypertension   . Hypothyroidism   . Osteoporosis   . Thyroid disease     SOCIAL HX:  Social History   Tobacco Use  . Smoking status: Never Smoker  . Smokeless tobacco: Never Used  Substance Use Topics  . Alcohol use: No    Frequency: Never    ALLERGIES:  Allergies  Allergen Reactions  . Benadryl [Diphenhydramine] Other (See Comments)    Hallucinations  . Codeine Other (See Comments)  Hallucinations  . Fentanyl Other (See Comments)    Aggressive behavior, hallucinations  . Morphine And Related Other (See Comments)    Hallucinations  . Sulfa Antibiotics Hives  . Tramadol Other (See Comments)    Makes legs swell   . Nitrofurantoin Monohyd Macro Hives     PERTINENT MEDICATIONS:  Outpatient Encounter Medications as of 06/19/2018  Medication Sig  .  acetaminophen (TYLENOL) 500 MG tablet Take 2 tablets (1,000 mg total) by mouth 3 (three) times daily.  . hydrocerin (EUCERIN) CREA Apply 1 application topically daily. Apply Eucerin cream to bilat legs Q day after bathing  . levothyroxine (SYNTHROID, LEVOTHROID) 88 MCG tablet Take 88 mcg by mouth daily.   Marland Kitchen. lidocaine (LIDODERM) 5 % Place 1 patch onto the skin daily. Remove & Discard patch within 12 hours or as directed by MD. Apply to mid back.  Marland Kitchen. lisinopril (PRINIVIL,ZESTRIL) 20 MG tablet Take 20 mg by mouth daily.  . metoprolol succinate (TOPROL-XL) 50 MG 24 hr tablet Take 50 mg by mouth daily. Take with or immediately following a meal.  . PROAIR HFA 108 (90 Base) MCG/ACT inhaler Inhale 2 puffs into the lungs every 4 (four) hours as needed for wheezing or shortness of breath.   . traZODone (DESYREL) 50 MG tablet Take 1 tablet (50 mg total) by mouth at bedtime.   No facility-administered encounter medications on file as of 06/19/2018.     PHYSICAL EXAM:  VS: BP 116/60, HR 80, RR 12 General: Frail appearing, thin. Lying in bed getting personal care from aides. Oriented to self. Knew she wasn't home. Passing foul smelling liquidly stool. Able to make needs known. C/o back pain with turns.  Cardiovascular: regular rate and rhythm Pulmonary: clear ant fields Abdomen: soft, nontender, + bowel sounds GU: no suprapubic tenderness Extremities: no edema, no joint deformities Skin: Stage 2 pressure injuries mid thoracic spine, and scattered small areas bilateral coccyx area. Facility staff repositioning and applying barrier cream. She has Chronic LE changes of chronic venous stasis. No open areas.  Neurological: Weakness but otherwise nonfocal  Anselm LisMary P Kelise Kuch, NP

## 2018-06-25 ENCOUNTER — Other Ambulatory Visit: Payer: Self-pay | Admitting: *Deleted

## 2018-06-25 NOTE — Patient Outreach (Signed)
[facility]) Care Management  06/25/2018  Aamari Zollars April 03, 1921 [id number]   Onsite visit at [facility]. Met with patient and her daughter, Mia Creek at bedside. Patient working with therapy to get out of bed to wheelchair.  Daughter reports she is familiar with [facility] care management from the past.  Daughter reports that she has cared for patient for 7 years and she is getting tired as patient needs more physical assistance with transfers. She feels patient needs to remain LTC/custodial, she wanted her at at another facility but they did not have any beds.  Patient is 4th on wait list for [facility] but cannot go under skilled and then transition to Medicaid.  Daughter states patient will transition to LTC at [facility] upon completion of rehab stay.   Peggy, SW confirms  patient will remain at facility for LTC and can transfer out to [facility] at a future time.  No [facility] care management needs at this time. Will sign off and collaborate with [facility] Royetta Crochet. Laymond Purser, RN, BSN, [place] [phone]) Business Cell  [phone]) Toll Free Office

## 2018-06-30 ENCOUNTER — Telehealth: Payer: Self-pay | Admitting: Internal Medicine

## 2018-06-30 NOTE — Telephone Encounter (Signed)
4:40pm:  I spoke to patient's daughter Nigel MormonBarbara Marshall [phone]). [name] mentions she believes her mom is doing well, and doesn't want Palliative Care to continue following her. She will call us if needed. [name] is really happy at the [facility] and is thinking of perhaps keeping her at the facility long term after patient's rehab stay is completed.  Holly BodilyMary  Jacorion Klem NP-C [phone]

## 2018-08-05 ENCOUNTER — Encounter (HOSPITAL_COMMUNITY): Payer: Self-pay

## 2018-08-05 ENCOUNTER — Other Ambulatory Visit: Payer: Self-pay

## 2018-08-05 ENCOUNTER — Emergency Department (HOSPITAL_COMMUNITY)
Admission: EM | Admit: 2018-08-05 | Discharge: 2018-08-05 | Disposition: A | Discharge status: Home / Self Care | Payer: Medicare Other | Attending: Emergency Medicine | Admitting: Emergency Medicine

## 2018-08-05 DIAGNOSIS — Y999 Unspecified external cause status: Secondary | ICD-10-CM | POA: Insufficient documentation

## 2018-08-05 DIAGNOSIS — I10 Essential (primary) hypertension: Secondary | ICD-10-CM | POA: Diagnosis not present

## 2018-08-05 DIAGNOSIS — Y939 Activity, unspecified: Secondary | ICD-10-CM | POA: Insufficient documentation

## 2018-08-05 DIAGNOSIS — J449 Chronic obstructive pulmonary disease, unspecified: Secondary | ICD-10-CM | POA: Diagnosis not present

## 2018-08-05 DIAGNOSIS — X58XXXA Exposure to other specified factors, initial encounter: Secondary | ICD-10-CM | POA: Insufficient documentation

## 2018-08-05 DIAGNOSIS — Z96642 Presence of left artificial hip joint: Secondary | ICD-10-CM | POA: Diagnosis not present

## 2018-08-05 DIAGNOSIS — E039 Hypothyroidism, unspecified: Secondary | ICD-10-CM | POA: Diagnosis not present

## 2018-08-05 DIAGNOSIS — S81811A Laceration without foreign body, right lower leg, initial encounter: Secondary | ICD-10-CM | POA: Diagnosis not present

## 2018-08-05 DIAGNOSIS — Z79899 Other long term (current) drug therapy: Secondary | ICD-10-CM | POA: Insufficient documentation

## 2018-08-05 DIAGNOSIS — Y929 Unspecified place or not applicable: Secondary | ICD-10-CM | POA: Insufficient documentation

## 2018-08-05 NOTE — Discharge Instructions (Addendum)
Return if any problems.

## 2018-08-05 NOTE — ED Provider Notes (Signed)
[facility]-EMERGENCY DEPT Provider Note   CSN: [id number] Arrival date & time: 08/05/18  0272     History   Chief Complaint Chief Complaint  Patient presents with  . Extremity Laceration    HPI Mikayla Villanueva is a 85 y.o. female.  The history is provided by the patient. No language interpreter was used.  Laceration  Location:  Leg Leg laceration location:  R lower leg Length:  15 Depth:  Through dermis Quality: jagged   Bleeding: venous   Pain details:    Quality:  Aching Relieved by:  Nothing Worsened by:  Nothing Tetanus status:  Unknown  Pt cut leg on wheelchair Past Medical History:  Diagnosis Date  . Arthritis   . Diverticulitis   . Hypercholesteremia   . Hyperlipemia   . Hypertension   . Hypothyroidism   . Osteoporosis   . Thyroid disease     Patient Active Problem List   Diagnosis Date Noted  . Acute midline thoracic back pain   . Palliative care by specialist   . Goals of care, counseling/discussion   . Pressure injury of skin 06/11/2018  . UTI (urinary tract infection) 06/10/2018  . Rib pain 06/10/2018  . Abdominal tenderness 06/10/2018  . Venous stasis 06/10/2018  . Reactive airway disease 06/10/2018  . Dementia (HCC) 06/10/2018  . Vertebral compression fracture (HCC) 01/14/2014  . Cellulitis 01/14/2014  . Hypothyroidism 06/10/2013  . MRSA nasal colonization 06/08/2013  . Unspecified hereditary and idiopathic peripheral neuropathy 06/08/2013  . Closed left hip fracture (HCC) 06/05/2013  . HTN (hypertension) 06/05/2013  . COPD (chronic obstructive pulmonary disease) (HCC) 06/05/2013  . UTI (lower urinary tract infection) 06/05/2013  . Diverticulitis of colon with perforation, percutaneous drainage 12/11/2011    Past Surgical History:  Procedure Laterality Date  . ABDOMINAL HYSTERECTOMY    . ANKLE SURGERY    . BACK SURGERY    . broken wrist Left    left, Dr. Cleophas Dunker,  . EYE SURGERY    . HIP ARTHROPLASTY Left  06/07/2013   Procedure: ARTHROPLASTY BIPOLAR HIP;  Surgeon: Loanne Drilling, MD;  Location: [facility];  Service: Orthopedics;  Laterality: Left;     OB History   No obstetric history on file.      Home Medications    Prior to Admission medications   Medication Sig Start Date End Date Taking? Authorizing Provider  acetaminophen (TYLENOL) 500 MG tablet Take 2 tablets (1,000 mg total) by mouth 3 (three) times daily. 06/16/18   Hongalgi, Maximino Greenland, MD  hydrocerin (EUCERIN) CREA Apply 1 application topically daily. Apply Eucerin cream to bilat legs Q day after bathing 06/17/18   Hongalgi, Maximino Greenland, MD  levothyroxine (SYNTHROID, LEVOTHROID) 88 MCG tablet Take 88 mcg by mouth daily.     [provider]  lidocaine (LIDODERM) 5 % Place 1 patch onto the skin daily. Remove & Discard patch within 12 hours or as directed by MD. Apply to mid back. 06/16/18   Hongalgi, Maximino Greenland, MD  lisinopril (PRINIVIL,ZESTRIL) 20 MG tablet Take 20 mg by mouth daily.    [provider]  metoprolol succinate (TOPROL-XL) 50 MG 24 hr tablet Take 50 mg by mouth daily. Take with or immediately following a meal.    [provider]  PROAIR HFA 108 (90 Base) MCG/ACT inhaler Inhale 2 puffs into the lungs every 4 (four) hours as needed for wheezing or shortness of breath.  10/16/16   [provider]  traZODone (DESYREL) 50 MG  tablet Take 1 tablet (50 mg total) by mouth at bedtime. 06/16/18   Hongalgi, Maximino Greenland, MD    Family History Family History  Problem Relation Age of Onset  . Heart disease Mother   . Stroke Father     Social History Social History   Tobacco Use  . Smoking status: Never Smoker  . Smokeless tobacco: Never Used  Substance Use Topics  . Alcohol use: No    Frequency: Never  . Drug use: No     Allergies   Benadryl [diphenhydramine]; Codeine; Fentanyl; Morphine and related; Sulfa antibiotics; Tramadol; and Nitrofurantoin monohyd macro   Review of Systems Review of Systems    All other systems reviewed and are negative.    Physical Exam Updated Vital Signs BP (!) 146/79   Pulse 75   Temp 97.8 F (36.6 C)   Resp 16   Ht 5' (1.524 m)   Wt 45.9 kg   SpO2 99%   BMI 19.76 kg/m   Physical Exam Vitals signs and nursing note reviewed.  HENT:     Head: Normocephalic.  Neck:     Musculoskeletal: Normal range of motion.  Cardiovascular:     Rate and Rhythm: Normal rate.  Musculoskeletal:        General: Swelling and tenderness present.     Comments: 20 cm laceration right lower leg gapping   Skin:    General: Skin is warm.  Neurological:     Mental Status: She is alert.  Psychiatric:        Mood and Affect: Mood normal.      ED Treatments / Results  Labs (all labs ordered are listed, but only abnormal results are displayed) Labs Reviewed - No data to display  EKG None  Radiology No results found.  Procedures .Marland KitchenLaceration Repair Date/Time: 08/05/2018 8:39 AM Performed by: Elson Areas, PA-C Authorized by: Elson Areas, PA-C   Consent:    Consent obtained:  Verbal   Consent given by:  Patient   Risks discussed:  Pain   Alternatives discussed:  No treatment Anesthesia (see MAR for exact dosages):    Anesthesia method:  None Laceration details:    Location:  Leg   Length (cm):  20 Repair type:    Repair type:  Simple Exploration:    Wound exploration: wound explored through full range of motion   Treatment:    Area cleansed with:  Betadine and saline   Amount of cleaning:  Standard   Irrigation solution:  Sterile saline   Visualized foreign bodies/material removed: no   Skin repair:    Repair method:  Tissue adhesive Approximation:    Approximation:  Loose Post-procedure details:    Dressing:  Non-adherent dressing   Patient tolerance of procedure:  Tolerated well, no immediate complications Comments:     Skin tear with retracted edges,  Edges approximated with dermabond.  3 steristrips to help hold   (including  critical care time)  Medications Ordered in ED Medications - No data to display   Initial Impression / Assessment and Plan / ED Course  I have reviewed the triage vital signs and the nursing notes.  Pertinent labs & imaging results that were available during my care of the patient were reviewed by me and considered in my medical decision making (see chart for details).     MDM  Wound may require extended time to heal   Final Clinical Impressions(s) / ED Diagnoses   Final diagnoses:  Laceration of  right lower leg, initial encounter    ED Discharge Orders    None    An After Visit Summary was printed and given to the patient.    Elson AreasSofia, Leslie K, [place] JerseyPA-C 08/05/18 [id number]    Margarita Grizzleay, Danielle, MD 08/05/18 [phone]

## 2018-08-05 NOTE — ED Triage Notes (Signed)
Per EMS: Pt has 5 in skin tear/laceration to L lower leg when getting out of bed this morning.

## 2018-08-05 NOTE — ED Notes (Signed)
Bed: MO29 Expected date:  Expected time:  Means of arrival:  Comments: 85 yo F/SNF laceration to leg

## 2018-08-05 NOTE — ED Notes (Signed)
Dressed pt's skin tear with non-adhesive bandages, and wrapped with gauze

## 2018-08-05 NOTE — ED Notes (Signed)
[facility] called for transport.  

## 2019-07-17 DEATH — deceased
